# Patient Record
Sex: Male | Born: 1955 | Race: White | Hispanic: No | Marital: Married | Smoking: Former smoker
Health system: Southern US, Community
[De-identification: ages and names within clinical notes are randomized; demographics above are authoritative.]

## PROBLEM LIST (undated history)

## (undated) DIAGNOSIS — I10 Essential (primary) hypertension: Secondary | ICD-10-CM

## (undated) DIAGNOSIS — R7301 Impaired fasting glucose: Secondary | ICD-10-CM

## (undated) DIAGNOSIS — E785 Hyperlipidemia, unspecified: Secondary | ICD-10-CM

## (undated) DIAGNOSIS — I2510 Atherosclerotic heart disease of native coronary artery without angina pectoris: Secondary | ICD-10-CM

## (undated) DIAGNOSIS — R5383 Other fatigue: Secondary | ICD-10-CM

## (undated) DIAGNOSIS — R002 Palpitations: Secondary | ICD-10-CM

## (undated) HISTORY — DX: Essential (primary) hypertension: I10

## (undated) HISTORY — DX: Palpitations: R00.2

## (undated) HISTORY — PX: OTHER SURGICAL HISTORY: SHX169

## (undated) HISTORY — DX: Atherosclerotic heart disease of native coronary artery without angina pectoris: I25.10

## (undated) HISTORY — DX: Impaired fasting glucose: R73.01

## (undated) HISTORY — DX: Other fatigue: R53.83

## (undated) HISTORY — DX: Hyperlipidemia, unspecified: E78.5

---

## 1999-01-07 ENCOUNTER — Ambulatory Visit (HOSPITAL_COMMUNITY): Admission: RE | Admit: 1999-01-07 | Discharge: 1999-01-07 | Payer: Self-pay | Admitting: *Deleted

## 2001-07-31 ENCOUNTER — Encounter: Payer: Self-pay | Admitting: Cardiology

## 2001-07-31 ENCOUNTER — Ambulatory Visit (HOSPITAL_COMMUNITY): Admission: RE | Admit: 2001-07-31 | Discharge: 2001-07-31 | Payer: Self-pay | Admitting: Cardiology

## 2001-12-22 ENCOUNTER — Ambulatory Visit (HOSPITAL_COMMUNITY): Admission: RE | Admit: 2001-12-22 | Discharge: 2001-12-22 | Payer: Self-pay | Admitting: Pulmonary Disease

## 2010-01-14 ENCOUNTER — Emergency Department (HOSPITAL_COMMUNITY)
Admission: EM | Admit: 2010-01-14 | Discharge: 2010-01-14 | Payer: Self-pay | Source: Home / Self Care | Admitting: Emergency Medicine

## 2010-06-25 LAB — URINALYSIS, ROUTINE W REFLEX MICROSCOPIC
Bilirubin Urine: NEGATIVE
Glucose, UA: 250 mg/dL — AB
Ketones, ur: NEGATIVE mg/dL
Leukocytes, UA: NEGATIVE
Nitrite: NEGATIVE
Protein, ur: 30 mg/dL — AB
Specific Gravity, Urine: >1.03 — ABNORMAL HIGH (ref 1.005–1.030)
Urobilinogen, UA: 0.2 mg/dL (ref 0.0–1.0)
pH: 5.5 (ref 5.0–8.0)

## 2010-06-25 LAB — HEPATIC FUNCTION PANEL
Albumin: 4.6 g/dL (ref 3.5–5.2)
Alkaline Phosphatase: 42 U/L (ref 39–117)
Total Protein: 7.4 g/dL (ref 6.0–8.3)

## 2010-06-25 LAB — LIPASE, BLOOD: Lipase: 34 U/L (ref 11–59)

## 2010-06-25 LAB — CBC
MCH: 29.9 pg (ref 26.0–34.0)
Platelets: 132 10*3/uL — ABNORMAL LOW (ref 150–400)
RBC: 5.15 MIL/uL (ref 4.22–5.81)

## 2010-06-25 LAB — DIFFERENTIAL
Basophils Absolute: 0 10*3/uL (ref 0.0–0.1)
Basophils Relative: 0 % (ref 0–1)
Eosinophils Absolute: 0 10*3/uL (ref 0.0–0.7)
Lymphs Abs: 1.4 10*3/uL (ref 0.7–4.0)
Neutrophils Relative %: 78 % — ABNORMAL HIGH (ref 43–77)

## 2010-06-25 LAB — URINE MICROSCOPIC-ADD ON

## 2010-06-25 LAB — BASIC METABOLIC PANEL
CO2: 25 mEq/L (ref 19–32)
Calcium: 9.7 mg/dL (ref 8.4–10.5)
Creatinine, Ser: 1.07 mg/dL (ref 0.4–1.5)
GFR calc Af Amer: >60 mL/min (ref >60)

## 2010-08-28 NOTE — Procedures (Signed)
[facility]  Patient:    Glenn [name], Glenn [name] Visit Number: [id number] MRN: [id number]          Service Type: OUT Location: RAD Attending Physician:  Mirian Mo Dictated by:   Joellyn Rued, P.A.-C. Proc. Date: 07/31/01 Admit Date:  07/31/2001   CC:         Butch Penny, M.D.   Stress Test  DATE OF BIRTH:  May 13, 1955  PROCEDURE:  Stress Cardiolite test  CARDIOLOGIST:  Valera Castle, M.D.  PRIMARY CARE PHYSICIAN:  Butch Penny, M.D.  INDICATIONS:  [name] is a 55 year old white male who was seen back in the office on his yearly evaluation for risk factor modification.  He is not having any active cardiac problems.  His history is notable for hyperlipidemia, nonobstructive coronary artery disease by catheterization in 2000, where he had a 70% diagonal-I and a 20%-30% proximal RCA.  He has remote tobacco use.  A strong early family history.  DESCRIPTION OF PROCEDURE:  The resting electrocardiogram showed a normal sinus rhythm.  Blood pressure 114/80.  Utilizing the Bruce protocol, [name] ambulated for a total of 13 minutes and 37 seconds, achieving 15.1 METS. Maximum heart rate was 174, which was 101% of predicted maximum value.  The maximum blood pressure was 200/60.  He did not have any electrocardiogram changes during the test.  His symptoms were fatigue.  The final results and images are pending Dr. Lucianne Muss review. Dictated by:   Joellyn Rued, P.A.-C. Attending Physician:  Mirian Mo DD:  07/31/01 TD:  07/31/01 Job: [id number]

## 2013-07-11 ENCOUNTER — Ambulatory Visit (HOSPITAL_COMMUNITY)
Admission: RE | Admit: 2013-07-11 | Discharge: 2013-07-11 | Disposition: A | Discharge status: Home / Self Care | Payer: 59 | Source: Ambulatory Visit | Attending: Family Medicine | Admitting: Family Medicine

## 2013-07-11 ENCOUNTER — Other Ambulatory Visit (HOSPITAL_COMMUNITY): Payer: Self-pay | Admitting: Family Medicine

## 2013-07-11 DIAGNOSIS — M25569 Pain in unspecified knee: Secondary | ICD-10-CM

## 2013-07-11 DIAGNOSIS — M259 Joint disorder, unspecified: Secondary | ICD-10-CM | POA: Insufficient documentation

## 2013-07-11 DIAGNOSIS — M25469 Effusion, unspecified knee: Secondary | ICD-10-CM | POA: Insufficient documentation

## 2015-12-23 ENCOUNTER — Encounter: Payer: Self-pay | Admitting: Orthopaedic Surgery

## 2015-12-23 ENCOUNTER — Ambulatory Visit (INDEPENDENT_AMBULATORY_CARE_PROVIDER_SITE_OTHER): Payer: 59 | Admitting: Orthopaedic Surgery

## 2015-12-23 ENCOUNTER — Ambulatory Visit (INDEPENDENT_AMBULATORY_CARE_PROVIDER_SITE_OTHER): Payer: 59

## 2015-12-23 VITALS — BP 143/71 | HR 58 | Ht 70.0 in | Wt 188.0 lb

## 2015-12-23 DIAGNOSIS — M25561 Pain in right knee: Secondary | ICD-10-CM

## 2015-12-23 NOTE — Progress Notes (Signed)
Subjective:    Patient ID: Glenn [name], male    DOB: [birth year], 60 y.o.   MRN: [id number]  HPI  He hit his right knee on truck door about two to three weeks ago.  He had pain and then swelling of the knee around the patella tendon area.  It became very swollen and painful.  He has had no redness, no giving way.  He saw his family doctor and some blood was aspirated.  He still has swelling and pain.  He is not getting any better. Ice and heat have not helped.  He has no other trauma, no other joint pains.  Review of Systems  HENT: Negative for congestion.   Respiratory: Negative for cough and shortness of breath.   Cardiovascular: Negative for chest pain and leg swelling.  Endocrine: Negative for cold intolerance.  Musculoskeletal: Positive for arthralgias, gait problem and joint swelling.  Allergic/Immunologic: Negative for environmental allergies.   History reviewed. No pertinent past medical history.  History reviewed. No pertinent surgical history.  No current outpatient prescriptions on file prior to visit.   No current facility-administered medications on file prior to visit.     Social History   Social History  . Marital status: Married    Spouse name: N/A  . Number of children: N/A  . Years of education: N/A   Occupational History  . Not on file.   Social History Main Topics  . Smoking status: Former Games developer  . Smokeless tobacco: Never Used  . Alcohol use Yes     Comment: on occasion  . Drug use: No  . Sexual activity: Not on file   Other Topics Concern  . Not on file   Social History Narrative  . No narrative on file    Family History  Problem Relation Age of Onset  . Diabetes Mother   . Hypertension Mother   . Cancer Father     BP (!) 143/71   Pulse (!) 58   Ht 5\' 10"  (1.778 m)   Wt 188 lb (85.3 kg)   BMI 26.98 kg/m       Objective:   Physical Exam  Constitutional: He is oriented to person, place, and time. He appears well-developed  and well-nourished.  HENT:  Head: Normocephalic and atraumatic.  Eyes: Conjunctivae and EOM are normal. Pupils are equal, round, and reactive to light.  Neck: Normal range of motion. Neck supple.  Cardiovascular: Normal rate, regular rhythm and intact distal pulses.   Pulmonary/Chest: Effort normal.  Abdominal: Soft.  Musculoskeletal: He exhibits tenderness (He has very large anterior effusion of the knee anterior to the patella tendon and not over the patella.  He has no effusion of the knee itself.  ROM is tender but nearly full.  It is stable.  Left knee is negative.).  Neurological: He is alert and oriented to person, place, and time. He displays normal reflexes. No cranial nerve deficit. He exhibits normal muscle tone. Coordination normal.  Skin: Skin is warm and dry.  Psychiatric: He has a normal mood and affect. His behavior is normal. Judgment and thought content normal.    X-rays were done of the right knee, reported separately.      Assessment & Plan:   Encounter Diagnosis  Name Primary?  . Right knee pain Yes   PROCEDURE NOTE:  The patient request injection, verbal consent was obtained.  The right knee was prepped appropriately after time out was performed.   Sterile technique was observed  and anesthesia was provided by ethyl chloride and a 20-gauge needle was used to inject the knee area.  A 16-gauge needle was then used to aspirate the knee.  Color of fluid aspirated was bloody  Total cc's aspirated was 50.    A band aid dressing was applied.  The patient was advised to apply ice later today and tomorrow to the injection sight as needed.  I have told him to use ice to the right knee.  Return in one week.  Call if any problem.  Precautions discussed.  Electronically Signed Darreld McleanWayne Alizey Noren, MD 9/12/20179:44 AM

## 2015-12-30 ENCOUNTER — Ambulatory Visit (INDEPENDENT_AMBULATORY_CARE_PROVIDER_SITE_OTHER): Payer: 59 | Admitting: Orthopaedic Surgery

## 2015-12-30 ENCOUNTER — Encounter: Payer: Self-pay | Admitting: Orthopaedic Surgery

## 2015-12-30 VITALS — BP 159/83 | HR 57 | Temp 98.1°F | Ht 70.0 in | Wt 188.0 lb

## 2015-12-30 DIAGNOSIS — M25561 Pain in right knee: Secondary | ICD-10-CM | POA: Diagnosis not present

## 2015-12-30 MED ORDER — PREDNISONE 5 MG (21) PO TBPK: ORAL_TABLET | ORAL | 0 refills | Status: DC

## 2015-12-30 NOTE — Progress Notes (Signed)
CC:  My knee is swelling again  He has recurrent swelling of the pre-patella tendon area.  It started swelling the next day after he was here last week.  He has a large effusion over the patella tendon area.  Procedure note:  After sterile prep and permission from the patient, the area of swelling in the right pre-patella tendon area was aspirated by sterile technique tolerated well.  30 cc of bloody material was aspirated.  He is to return in one week.  If the swelling returns, consider surgery.  Call if any problem.  Encounter Diagnosis  Name Primary?  . Right knee pain Yes   Electronically Signed Darreld Mclean, MD 9/19/20179:55 AM

## 2016-01-06 ENCOUNTER — Encounter: Payer: Self-pay | Admitting: Orthopaedic Surgery

## 2016-01-06 ENCOUNTER — Ambulatory Visit (INDEPENDENT_AMBULATORY_CARE_PROVIDER_SITE_OTHER): Payer: 59 | Admitting: Orthopaedic Surgery

## 2016-01-06 VITALS — BP 161/82 | HR 65 | Temp 97.9°F | Ht 70.0 in | Wt 190.0 lb

## 2016-01-06 DIAGNOSIS — M25561 Pain in right knee: Secondary | ICD-10-CM

## 2016-01-06 NOTE — Progress Notes (Signed)
[name], male DOB:1955-10-07, 60 y.o. ZJQ:[id number]  Chief Complaint  Patient presents with  . Follow-up    right knee    HPI  Glenn [name] is a 60 y.o. male who has recurrent effusion of the pre-patella tendon area on the right.  He has less swelling today but it has recurred after aspiration.  I have suggested he consider excision of the bursa.  He wants to think about that.  I will see him in one month.  He is to call if worse or turns red. HPI  Body mass index is 27.26 kg/m.  ROS  Review of Systems  HENT: Negative for congestion.   Respiratory: Negative for cough and shortness of breath.   Cardiovascular: Negative for chest pain and leg swelling.  Endocrine: Negative for cold intolerance.  Musculoskeletal: Positive for arthralgias, gait problem and joint swelling.  Allergic/Immunologic: Negative for environmental allergies.    No past medical history on file.  No past surgical history on file.  Family History  Problem Relation Age of Onset  . Diabetes Mother   . Hypertension Mother   . Cancer Father     Social History Social History  Substance Use Topics  . Smoking status: Former Games developer  . Smokeless tobacco: Never Used  . Alcohol use Yes     Comment: on occasion    No Known Allergies  Current Outpatient Prescriptions  Medication Sig Dispense Refill  . aspirin EC 81 MG tablet Take 81 mg by mouth daily.    . naproxen (NAPROSYN) 500 MG tablet Take 500 mg by mouth 2 (two) times daily with a meal.    . predniSONE (STERAPRED UNI-PAK 21 TAB) 5 MG (21) TBPK tablet Take 6 pills first day; 5 pills second day; 4 pills third day; 3 pills fourth day; 2 pills next day and 1 pill last day. (Patient not taking: Reported on 01/06/2016) 21 tablet 0   No current facility-administered medications for this visit.      Physical Exam  Blood pressure (!) 161/82, pulse 65, temperature 97.9 F (36.6 C), height 5\' 10"  (1.778 m), weight 190 lb (86.2  kg).  Constitutional: overall normal hygiene, normal nutrition, well developed, normal grooming, normal body habitus. Assistive device:none  Musculoskeletal: gait and station Limp none, muscle tone and strength are normal, no tremors or atrophy is present.  .  Neurological: coordination overall normal.  Deep tendon reflex/nerve stretch intact.  Sensation normal.  Cranial nerves II-XII intact.   Skin:   Normal overall no scars, lesions, ulcers or rashes. No psoriasis.  Psychiatric: Alert and oriented x 3.  Recent memory intact, remote memory unclear.  Normal mood and affect. Well groomed.  Good eye contact.  Cardiovascular: overall no swelling, no varicosities, no edema bilaterally, normal temperatures of the legs and arms, no clubbing, cyanosis and good capillary refill.  Lymphatic: palpation is normal.  He has a large pre-patella tendon bursa that is about size of half an orange.  It is not red and not tender.  He has no effusion of the knee.  He has full motion of the knee on the right.  He has no limp.  NV intact.  The patient has been educated about the nature of the problem(s) and counseled on treatment options.  The patient appeared to understand what I have discussed and is in agreement with it.  Encounter Diagnosis  Name Primary?  . Right knee pain Yes    PLAN Call if any problems.  Precautions  discussed.  Continue current medications.   Return to clinic 1 month   Electronically Signed Darreld McleanWayne Emilie Carp, MD 9/26/20178:21 AM

## 2016-01-12 ENCOUNTER — Telehealth: Payer: Self-pay | Admitting: Orthopaedic Surgery

## 2016-01-12 NOTE — Telephone Encounter (Signed)
Patient has decided not to wait to see you later this month for right knee pain.  He wants to go ahead and see Dr. Romeo Apple for possible excision of bursa that you discussed with him.    Can we get this scheduled with Dr. Romeo Apple?

## 2016-01-18 NOTE — Telephone Encounter (Signed)
Of course!

## 2016-02-03 ENCOUNTER — Encounter: Payer: Self-pay | Admitting: Orthopaedic Surgery

## 2016-02-03 ENCOUNTER — Ambulatory Visit (INDEPENDENT_AMBULATORY_CARE_PROVIDER_SITE_OTHER): Payer: 59 | Admitting: Orthopaedic Surgery

## 2016-02-03 VITALS — BP 166/86 | HR 56 | Temp 97.7°F | Ht 70.0 in | Wt 189.0 lb

## 2016-02-03 DIAGNOSIS — M25561 Pain in right knee: Secondary | ICD-10-CM

## 2016-02-03 DIAGNOSIS — G8929 Other chronic pain: Secondary | ICD-10-CM

## 2016-02-03 NOTE — Progress Notes (Signed)
[name], male DOB:[birth year], 60 y.o. OIN:[id number]  Chief Complaint  Patient presents with  . Follow-up    right knee pain    HPI  Glenn [name] is a 60 y.o. male who has prepatella tendon swelling.  It is much improved. He has less pain and full motion.  He has no new trauma,no redness. HPI  Body mass index is 27.12 kg/m.  ROS  Review of Systems  HENT: Negative for congestion.   Respiratory: Negative for cough and shortness of breath.   Cardiovascular: Negative for chest pain and leg swelling.  Endocrine: Negative for cold intolerance.  Musculoskeletal: Positive for arthralgias, gait problem and joint swelling.  Allergic/Immunologic: Negative for environmental allergies.    No past medical history on file.  No past surgical history on file.  Family History  Problem Relation Age of Onset  . Diabetes Mother   . Hypertension Mother   . Cancer Father     Social History Social History  Substance Use Topics  . Smoking status: Former Games developer  . Smokeless tobacco: Never Used  . Alcohol use Yes     Comment: on occasion    No Known Allergies  Current Outpatient Prescriptions  Medication Sig Dispense Refill  . aspirin EC 81 MG tablet Take 81 mg by mouth daily.    . naproxen (NAPROSYN) 500 MG tablet Take 500 mg by mouth 2 (two) times daily with a meal.    . predniSONE (STERAPRED UNI-PAK 21 TAB) 5 MG (21) TBPK tablet Take 6 pills first day; 5 pills second day; 4 pills third day; 3 pills fourth day; 2 pills next day and 1 pill last day. (Patient not taking: Reported on 01/06/2016) 21 tablet 0   No current facility-administered medications for this visit.      Physical Exam  Blood pressure (!) 166/86, pulse (!) 56, temperature 97.7 F (36.5 C), height 5\' 10"  (1.778 m), weight 189 lb (85.7 kg).  Constitutional: overall normal hygiene, normal nutrition, well developed, normal grooming, normal body habitus. Assistive device:none  Musculoskeletal: gait  and station Limp none, muscle tone and strength are normal, no tremors or atrophy is present.  .  Neurological: coordination overall normal.  Deep tendon reflex/nerve stretch intact.  Sensation normal.  Cranial nerves II-XII intact.   Skin:   Normal overall no scars, lesions, ulcers or rashes. No psoriasis.  Psychiatric: Alert and oriented x 3.  Recent memory intact, remote memory unclear.  Normal mood and affect. Well groomed.  Good eye contact.  Cardiovascular: overall no swelling, no varicosities, no edema bilaterally, normal temperatures of the legs and arms, no clubbing, cyanosis and good capillary refill.  Lymphatic: palpation is normal.  His pre-patella tendon swelling on the right knee is almost gone.  He has residual of the bursa itself.  He has no redness, full motion, normal gait.  The patient has been educated about the nature of the problem(s) and counseled on treatment options.  The patient appeared to understand what I have discussed and is in agreement with it.  Encounter Diagnosis  Name Primary?  . Chronic pain of right knee Yes    PLAN Call if any problems.  Precautions discussed.  Continue current medications.   Return to clinic PRn   Electronically Signed Darreld Mclean, MD 10/24/20178:32 AM

## 2017-02-26 DIAGNOSIS — M25512 Pain in left shoulder: Secondary | ICD-10-CM | POA: Diagnosis not present

## 2017-02-28 DIAGNOSIS — M546 Pain in thoracic spine: Secondary | ICD-10-CM | POA: Diagnosis not present

## 2017-02-28 DIAGNOSIS — S43422A Sprain of left rotator cuff capsule, initial encounter: Secondary | ICD-10-CM | POA: Diagnosis not present

## 2017-02-28 DIAGNOSIS — S134XXA Sprain of ligaments of cervical spine, initial encounter: Secondary | ICD-10-CM | POA: Diagnosis not present

## 2017-03-01 DIAGNOSIS — S43422A Sprain of left rotator cuff capsule, initial encounter: Secondary | ICD-10-CM | POA: Diagnosis not present

## 2017-03-01 DIAGNOSIS — S134XXA Sprain of ligaments of cervical spine, initial encounter: Secondary | ICD-10-CM | POA: Diagnosis not present

## 2017-03-01 DIAGNOSIS — M546 Pain in thoracic spine: Secondary | ICD-10-CM | POA: Diagnosis not present

## 2017-03-07 DIAGNOSIS — S134XXA Sprain of ligaments of cervical spine, initial encounter: Secondary | ICD-10-CM | POA: Diagnosis not present

## 2017-03-07 DIAGNOSIS — M546 Pain in thoracic spine: Secondary | ICD-10-CM | POA: Diagnosis not present

## 2017-03-07 DIAGNOSIS — S43422A Sprain of left rotator cuff capsule, initial encounter: Secondary | ICD-10-CM | POA: Diagnosis not present

## 2017-03-10 DIAGNOSIS — S134XXA Sprain of ligaments of cervical spine, initial encounter: Secondary | ICD-10-CM | POA: Diagnosis not present

## 2017-03-10 DIAGNOSIS — S43422A Sprain of left rotator cuff capsule, initial encounter: Secondary | ICD-10-CM | POA: Diagnosis not present

## 2017-03-10 DIAGNOSIS — M546 Pain in thoracic spine: Secondary | ICD-10-CM | POA: Diagnosis not present

## 2017-03-14 DIAGNOSIS — M546 Pain in thoracic spine: Secondary | ICD-10-CM | POA: Diagnosis not present

## 2017-03-14 DIAGNOSIS — S43422A Sprain of left rotator cuff capsule, initial encounter: Secondary | ICD-10-CM | POA: Diagnosis not present

## 2017-03-14 DIAGNOSIS — S134XXA Sprain of ligaments of cervical spine, initial encounter: Secondary | ICD-10-CM | POA: Diagnosis not present

## 2017-03-22 DIAGNOSIS — S134XXA Sprain of ligaments of cervical spine, initial encounter: Secondary | ICD-10-CM | POA: Diagnosis not present

## 2017-03-22 DIAGNOSIS — M546 Pain in thoracic spine: Secondary | ICD-10-CM | POA: Diagnosis not present

## 2017-03-22 DIAGNOSIS — S43422A Sprain of left rotator cuff capsule, initial encounter: Secondary | ICD-10-CM | POA: Diagnosis not present

## 2017-03-25 DIAGNOSIS — S43422A Sprain of left rotator cuff capsule, initial encounter: Secondary | ICD-10-CM | POA: Diagnosis not present

## 2017-03-25 DIAGNOSIS — S134XXA Sprain of ligaments of cervical spine, initial encounter: Secondary | ICD-10-CM | POA: Diagnosis not present

## 2017-03-25 DIAGNOSIS — M546 Pain in thoracic spine: Secondary | ICD-10-CM | POA: Diagnosis not present

## 2017-03-28 DIAGNOSIS — S43422A Sprain of left rotator cuff capsule, initial encounter: Secondary | ICD-10-CM | POA: Diagnosis not present

## 2017-03-28 DIAGNOSIS — S134XXA Sprain of ligaments of cervical spine, initial encounter: Secondary | ICD-10-CM | POA: Diagnosis not present

## 2017-03-28 DIAGNOSIS — M546 Pain in thoracic spine: Secondary | ICD-10-CM | POA: Diagnosis not present

## 2017-04-07 DIAGNOSIS — M546 Pain in thoracic spine: Secondary | ICD-10-CM | POA: Diagnosis not present

## 2017-04-07 DIAGNOSIS — S43422A Sprain of left rotator cuff capsule, initial encounter: Secondary | ICD-10-CM | POA: Diagnosis not present

## 2017-04-07 DIAGNOSIS — S134XXA Sprain of ligaments of cervical spine, initial encounter: Secondary | ICD-10-CM | POA: Diagnosis not present

## 2017-04-13 DIAGNOSIS — S134XXA Sprain of ligaments of cervical spine, initial encounter: Secondary | ICD-10-CM | POA: Diagnosis not present

## 2017-04-13 DIAGNOSIS — S43422A Sprain of left rotator cuff capsule, initial encounter: Secondary | ICD-10-CM | POA: Diagnosis not present

## 2017-04-13 DIAGNOSIS — M546 Pain in thoracic spine: Secondary | ICD-10-CM | POA: Diagnosis not present

## 2017-04-19 DIAGNOSIS — S134XXA Sprain of ligaments of cervical spine, initial encounter: Secondary | ICD-10-CM | POA: Diagnosis not present

## 2017-04-19 DIAGNOSIS — M546 Pain in thoracic spine: Secondary | ICD-10-CM | POA: Diagnosis not present

## 2017-04-19 DIAGNOSIS — S43422A Sprain of left rotator cuff capsule, initial encounter: Secondary | ICD-10-CM | POA: Diagnosis not present

## 2017-05-03 DIAGNOSIS — S43422A Sprain of left rotator cuff capsule, initial encounter: Secondary | ICD-10-CM | POA: Diagnosis not present

## 2017-05-03 DIAGNOSIS — M546 Pain in thoracic spine: Secondary | ICD-10-CM | POA: Diagnosis not present

## 2017-05-03 DIAGNOSIS — S134XXA Sprain of ligaments of cervical spine, initial encounter: Secondary | ICD-10-CM | POA: Diagnosis not present

## 2017-05-09 DIAGNOSIS — M75122 Complete rotator cuff tear or rupture of left shoulder, not specified as traumatic: Secondary | ICD-10-CM | POA: Insufficient documentation

## 2017-05-09 DIAGNOSIS — Y990 Civilian activity done for income or pay: Secondary | ICD-10-CM | POA: Insufficient documentation

## 2017-07-01 HISTORY — PX: SHOULDER ACROMIOPLASTY: SHX6093

## 2017-07-11 DIAGNOSIS — Z9889 Other specified postprocedural states: Secondary | ICD-10-CM | POA: Insufficient documentation

## 2017-11-17 ENCOUNTER — Encounter: Payer: Self-pay | Admitting: Cardiovascular Disease

## 2017-11-17 DIAGNOSIS — I10 Essential (primary) hypertension: Secondary | ICD-10-CM | POA: Diagnosis not present

## 2017-11-17 DIAGNOSIS — Z6827 Body mass index (BMI) 27.0-27.9, adult: Secondary | ICD-10-CM | POA: Diagnosis not present

## 2017-11-17 DIAGNOSIS — R002 Palpitations: Secondary | ICD-10-CM | POA: Diagnosis not present

## 2017-11-17 DIAGNOSIS — R5383 Other fatigue: Secondary | ICD-10-CM | POA: Diagnosis not present

## 2017-11-18 ENCOUNTER — Encounter: Payer: Self-pay | Admitting: Cardiovascular Disease

## 2017-11-18 DIAGNOSIS — I10 Essential (primary) hypertension: Secondary | ICD-10-CM | POA: Diagnosis not present

## 2017-11-18 DIAGNOSIS — E039 Hypothyroidism, unspecified: Secondary | ICD-10-CM | POA: Diagnosis not present

## 2017-11-18 DIAGNOSIS — R5383 Other fatigue: Secondary | ICD-10-CM | POA: Diagnosis not present

## 2017-11-18 DIAGNOSIS — Y92538 Other ambulatory health services establishments as the place of occurrence of the external cause: Secondary | ICD-10-CM | POA: Diagnosis not present

## 2017-11-18 DIAGNOSIS — R7301 Impaired fasting glucose: Secondary | ICD-10-CM | POA: Diagnosis not present

## 2017-11-18 DIAGNOSIS — R002 Palpitations: Secondary | ICD-10-CM | POA: Diagnosis not present

## 2017-11-18 DIAGNOSIS — D519 Vitamin B12 deficiency anemia, unspecified: Secondary | ICD-10-CM | POA: Diagnosis not present

## 2017-11-21 DIAGNOSIS — I10 Essential (primary) hypertension: Secondary | ICD-10-CM | POA: Diagnosis not present

## 2017-11-21 DIAGNOSIS — R5383 Other fatigue: Secondary | ICD-10-CM | POA: Diagnosis not present

## 2017-11-21 DIAGNOSIS — E785 Hyperlipidemia, unspecified: Secondary | ICD-10-CM | POA: Diagnosis not present

## 2017-11-21 DIAGNOSIS — Z0001 Encounter for general adult medical examination with abnormal findings: Secondary | ICD-10-CM | POA: Diagnosis not present

## 2017-12-19 ENCOUNTER — Encounter: Payer: Self-pay | Admitting: *Deleted

## 2017-12-20 ENCOUNTER — Ambulatory Visit (INDEPENDENT_AMBULATORY_CARE_PROVIDER_SITE_OTHER): Payer: 59 | Admitting: Cardiovascular Disease

## 2017-12-20 ENCOUNTER — Telehealth: Payer: Self-pay | Admitting: Cardiovascular Disease

## 2017-12-20 ENCOUNTER — Encounter: Payer: Self-pay | Admitting: Cardiovascular Disease

## 2017-12-20 ENCOUNTER — Encounter: Payer: Self-pay | Admitting: *Deleted

## 2017-12-20 VITALS — BP 90/58 | HR 56 | Ht 69.0 in | Wt 174.0 lb

## 2017-12-20 DIAGNOSIS — R0600 Dyspnea, unspecified: Secondary | ICD-10-CM

## 2017-12-20 DIAGNOSIS — R5383 Other fatigue: Secondary | ICD-10-CM | POA: Diagnosis not present

## 2017-12-20 DIAGNOSIS — E785 Hyperlipidemia, unspecified: Secondary | ICD-10-CM

## 2017-12-20 DIAGNOSIS — R0609 Other forms of dyspnea: Secondary | ICD-10-CM | POA: Diagnosis not present

## 2017-12-20 DIAGNOSIS — R079 Chest pain, unspecified: Secondary | ICD-10-CM

## 2017-12-20 DIAGNOSIS — I10 Essential (primary) hypertension: Secondary | ICD-10-CM | POA: Diagnosis not present

## 2017-12-20 NOTE — Progress Notes (Signed)
CARDIOLOGY CONSULT NOTE  Patient ID: JACQUES POPKE MRN: [id number] DOB/AGE: [birth year] 63 y.o.  Admit date: (Not on file) Primary Physician: Benita Stabile, MD Referring Physician: Benita Stabile, MD  Reason for Consultation: Fatigue  HPI: Glenn [name] is a 62 y.o. male who is being seen today for the evaluation of fatigue at the request of Benita Stabile, MD.   Past medical history includes hypertension and hyperlipidemia.  I personally reviewed the ECG performed on 11/17/2017 performed at his PCPs office which demonstrates sinus rhythm with T wave abnormalities in inferolateral leads.  I reviewed labs performed on 11/18/2017: Hemoglobin 16.2, platelets 158, BUN 19, creatinine 1.15, sodium 142, potassium 4.6, total cholesterol 243, triglycerides 114, HDL 53, LDL 167, hemoglobin A1c 5.8%, TSH 1.28.  He underwent a normal nuclear stress test on 08/09/2001.  He underwent left rotator cuff repair surgery last year and has not felt well since then.  He has felt fatigued but has noticed progressive exertional dyspnea and occasional precordial chest pains.  This week he tried to walk his trash can to the end of the driveway and felt short of breath.  He denies leg swelling but has noticed some bilateral calf soreness.  He has had palpitations.  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  Family history: Mother has diabetes and coronary disease.  His youngest brother also has coronary disease.    No Known Allergies  Current Outpatient Medications  Medication Sig Dispense Refill  . aspirin EC 81 MG tablet Take 81 mg by mouth daily.    Marland Kitchen escitalopram (LEXAPRO) 10 MG tablet Take 10 mg by mouth daily.    Marland Kitchen losartan (COZAAR) 25 MG tablet Take 25 mg by mouth daily.    . ranitidine (ZANTAC) 75 MG tablet Take 75 mg by mouth daily.     No current facility-administered medications for this visit.     Past Medical History:  Diagnosis Date  . Essential hypertension   .  Hyperlipidemia   . Impaired fasting glucose   . Other fatigue   . Palpitations     Past Surgical History:  Procedure Laterality Date  . SHOULDER ACROMIOPLASTY  07/01/2017    Social History   Socioeconomic History  . Marital status: Married    Spouse name: Not on file  . Number of children: Not on file  . Years of education: Not on file  . Highest education level: Not on file  Occupational History  . Not on file  Social Needs  . Financial resource strain: Not on file  . Food insecurity:    Worry: Not on file    Inability: Not on file  . Transportation needs:    Medical: Not on file    Non-medical: Not on file  Tobacco Use  . Smoking status: Former Games developer  . Smokeless tobacco: Never Used  Substance and Sexual Activity  . Alcohol use: Yes    Comment: on occasion  . Drug use: No  . Sexual activity: Not on file  Lifestyle  . Physical activity:    Days per week: Not on file    Minutes per session: Not on file  . Stress: Not on file  Relationships  . Social connections:    Talks on phone: Not on file    Gets together: Not on file    Attends religious service: Not on file    Active member of club or organization: Not  on file    Attends meetings of clubs or organizations: Not on file    Relationship status: Not on file  . Intimate partner violence:    Fear of current or ex partner: Not on file    Emotionally abused: Not on file    Physically abused: Not on file    Forced sexual activity: Not on file  Other Topics Concern  . Not on file  Social History Narrative  . Not on file      Current Meds  Medication Sig  . aspirin EC 81 MG tablet Take 81 mg by mouth daily.  Marland Kitchen escitalopram (LEXAPRO) 10 MG tablet Take 10 mg by mouth daily.  Marland Kitchen losartan (COZAAR) 25 MG tablet Take 25 mg by mouth daily.  . ranitidine (ZANTAC) 75 MG tablet Take 75 mg by mouth daily.      Review of systems complete and found to be negative unless listed above in HPI    Physical  exam Blood pressure (!) 90/58, pulse (!) 56, height 5\' 9"  (1.753 m), weight 174 lb (78.9 kg), SpO2 98 %. General: NAD Neck: No JVD, no thyromegaly or thyroid nodule.  Lungs: Clear to auscultation bilaterally with normal respiratory effort. CV: Nondisplaced PMI. Regular rate and rhythm, normal S1/S2, no S3/S4, no murmur.  No peripheral edema.  No carotid bruit.    Abdomen: Soft, nontender, no distention.  Skin: Intact without lesions or rashes.  Neurologic: Alert and oriented x 3.  Psych: Normal affect. Extremities: No clubbing or cyanosis.  HEENT: Normal.   ECG: Most recent ECG reviewed.   Labs: Lab Results  Component Value Date/Time   K 4.3 01/14/2010 10:40 AM   BUN 18 01/14/2010 10:40 AM   CREATININE 1.07 01/14/2010 10:40 AM   ALT 24 01/14/2010 10:40 AM   HGB 15.4 01/14/2010 10:40 AM     Lipids: No results found for: LDLCALC, LDLDIRECT, CHOL, TRIG, HDL      ASSESSMENT AND PLAN:  1.  Fatigue, chest pain, and progressive exertional dyspnea with abnormal ECG: Symptoms are certainly suspicious for ischemic heart disease.  I will obtain an exercise Myoview stress test for further clarification.  2.  Hypertension: Blood pressure is low.  He is on losartan 25 mg daily.  The dose of this may need to be decreased.  His blood pressure had been high previously.  He has been on losartan for at least a month.  This will need continued monitoring.  3.  Hyperlipidemia: Lipids reviewed above.  Managed by PCP.     Disposition: Follow up in 6-8 weeks  Signed: Prentice Docker, M.D., F.A.C.C.  12/20/2017, 1:26 PM

## 2017-12-20 NOTE — Telephone Encounter (Signed)
Pre-cert Verification for the following procedure   Exercise myoview scheduled for 12-22-2017 at [facility]

## 2017-12-20 NOTE — Patient Instructions (Addendum)
Medication Instructions:  Continue all current medications.  Labwork: none  Testing/Procedures:  Your physician has requested that you have an exercise stress myoview. For further information please visit [email or web address] Please follow instruction sheet, as given.  Office will contact with results via phone or letter.    Follow-Up: Next available   Any Other Special Instructions Will Be Listed Below (If Applicable).  If you need a refill on your cardiac medications before your next appointment, please call your pharmacy.

## 2017-12-21 NOTE — Addendum Note (Signed)
Addended by: Eustace Moore on: 12/21/2017 03:54 PM   Modules accepted: Orders

## 2017-12-22 ENCOUNTER — Other Ambulatory Visit: Payer: Self-pay

## 2017-12-22 ENCOUNTER — Encounter (HOSPITAL_COMMUNITY): Payer: Self-pay | Admitting: Emergency Medicine

## 2017-12-22 ENCOUNTER — Inpatient Hospital Stay (HOSPITAL_COMMUNITY): Payer: 59

## 2017-12-22 ENCOUNTER — Inpatient Hospital Stay (HOSPITAL_BASED_OUTPATIENT_CLINIC_OR_DEPARTMENT_OTHER)
Admission: RE | Admit: 2017-12-22 | Discharge: 2017-12-22 | Disposition: A | Discharge status: Home / Self Care | Payer: 59 | Source: Ambulatory Visit | Attending: Cardiovascular Disease | Admitting: Cardiovascular Disease

## 2017-12-22 ENCOUNTER — Encounter (HOSPITAL_COMMUNITY)
Admission: EM | Disposition: A | Discharge status: Home / Self Care | Payer: Self-pay | Source: Home / Self Care | Attending: Cardiothoracic Surgery

## 2017-12-22 ENCOUNTER — Encounter (HOSPITAL_COMMUNITY): Payer: Self-pay

## 2017-12-22 ENCOUNTER — Inpatient Hospital Stay (HOSPITAL_COMMUNITY)
Admission: EM | Admit: 2017-12-22 | Discharge: 2017-12-28 | DRG: 233 | Disposition: A | Discharge status: Home / Self Care | Payer: 59 | Attending: Cardiothoracic Surgery | Admitting: Cardiothoracic Surgery

## 2017-12-22 ENCOUNTER — Encounter (HOSPITAL_COMMUNITY)
Admission: RE | Admit: 2017-12-22 | Discharge: 2017-12-22 | Disposition: A | Discharge status: Home / Self Care | Payer: 59 | Source: Ambulatory Visit | Attending: Cardiovascular Disease | Admitting: Cardiovascular Disease

## 2017-12-22 ENCOUNTER — Other Ambulatory Visit: Payer: Self-pay | Admitting: *Deleted

## 2017-12-22 DIAGNOSIS — Z87891 Personal history of nicotine dependence: Secondary | ICD-10-CM | POA: Diagnosis not present

## 2017-12-22 DIAGNOSIS — Z79899 Other long term (current) drug therapy: Secondary | ICD-10-CM

## 2017-12-22 DIAGNOSIS — I5021 Acute systolic (congestive) heart failure: Secondary | ICD-10-CM | POA: Diagnosis not present

## 2017-12-22 DIAGNOSIS — R7301 Impaired fasting glucose: Secondary | ICD-10-CM | POA: Diagnosis present

## 2017-12-22 DIAGNOSIS — Z8249 Family history of ischemic heart disease and other diseases of the circulatory system: Secondary | ICD-10-CM | POA: Diagnosis not present

## 2017-12-22 DIAGNOSIS — I472 Ventricular tachycardia, unspecified: Secondary | ICD-10-CM

## 2017-12-22 DIAGNOSIS — J939 Pneumothorax, unspecified: Secondary | ICD-10-CM | POA: Diagnosis not present

## 2017-12-22 DIAGNOSIS — D62 Acute posthemorrhagic anemia: Secondary | ICD-10-CM | POA: Diagnosis not present

## 2017-12-22 DIAGNOSIS — I110 Hypertensive heart disease with heart failure: Secondary | ICD-10-CM | POA: Diagnosis present

## 2017-12-22 DIAGNOSIS — R0609 Other forms of dyspnea: Secondary | ICD-10-CM | POA: Insufficient documentation

## 2017-12-22 DIAGNOSIS — Z01810 Encounter for preprocedural cardiovascular examination: Secondary | ICD-10-CM

## 2017-12-22 DIAGNOSIS — H9193 Unspecified hearing loss, bilateral: Secondary | ICD-10-CM | POA: Diagnosis present

## 2017-12-22 DIAGNOSIS — R079 Chest pain, unspecified: Secondary | ICD-10-CM

## 2017-12-22 DIAGNOSIS — I469 Cardiac arrest, cause unspecified: Secondary | ICD-10-CM | POA: Diagnosis not present

## 2017-12-22 DIAGNOSIS — I213 ST elevation (STEMI) myocardial infarction of unspecified site: Secondary | ICD-10-CM | POA: Diagnosis not present

## 2017-12-22 DIAGNOSIS — R5383 Other fatigue: Secondary | ICD-10-CM | POA: Insufficient documentation

## 2017-12-22 DIAGNOSIS — Z7982 Long term (current) use of aspirin: Secondary | ICD-10-CM | POA: Diagnosis not present

## 2017-12-22 DIAGNOSIS — I25110 Atherosclerotic heart disease of native coronary artery with unstable angina pectoris: Secondary | ICD-10-CM

## 2017-12-22 DIAGNOSIS — I219 Acute myocardial infarction, unspecified: Secondary | ICD-10-CM

## 2017-12-22 DIAGNOSIS — I2510 Atherosclerotic heart disease of native coronary artery without angina pectoris: Secondary | ICD-10-CM

## 2017-12-22 DIAGNOSIS — I462 Cardiac arrest due to underlying cardiac condition: Secondary | ICD-10-CM | POA: Diagnosis present

## 2017-12-22 DIAGNOSIS — Z09 Encounter for follow-up examination after completed treatment for conditions other than malignant neoplasm: Secondary | ICD-10-CM

## 2017-12-22 DIAGNOSIS — Z951 Presence of aortocoronary bypass graft: Secondary | ICD-10-CM

## 2017-12-22 DIAGNOSIS — E785 Hyperlipidemia, unspecified: Secondary | ICD-10-CM | POA: Diagnosis present

## 2017-12-22 DIAGNOSIS — D696 Thrombocytopenia, unspecified: Secondary | ICD-10-CM | POA: Diagnosis not present

## 2017-12-22 DIAGNOSIS — J90 Pleural effusion, not elsewhere classified: Secondary | ICD-10-CM | POA: Diagnosis not present

## 2017-12-22 DIAGNOSIS — Z833 Family history of diabetes mellitus: Secondary | ICD-10-CM

## 2017-12-22 DIAGNOSIS — J9811 Atelectasis: Secondary | ICD-10-CM | POA: Diagnosis not present

## 2017-12-22 DIAGNOSIS — I200 Unstable angina: Secondary | ICD-10-CM | POA: Diagnosis not present

## 2017-12-22 DIAGNOSIS — Z01818 Encounter for other preprocedural examination: Secondary | ICD-10-CM | POA: Diagnosis not present

## 2017-12-22 DIAGNOSIS — R0600 Dyspnea, unspecified: Secondary | ICD-10-CM

## 2017-12-22 DIAGNOSIS — I10 Essential (primary) hypertension: Secondary | ICD-10-CM | POA: Diagnosis not present

## 2017-12-22 DIAGNOSIS — I255 Ischemic cardiomyopathy: Secondary | ICD-10-CM | POA: Diagnosis present

## 2017-12-22 DIAGNOSIS — I517 Cardiomegaly: Secondary | ICD-10-CM | POA: Diagnosis not present

## 2017-12-22 DIAGNOSIS — I959 Hypotension, unspecified: Secondary | ICD-10-CM | POA: Diagnosis not present

## 2017-12-22 DIAGNOSIS — R4020 Unspecified coma: Secondary | ICD-10-CM | POA: Diagnosis not present

## 2017-12-22 DIAGNOSIS — I340 Nonrheumatic mitral (valve) insufficiency: Secondary | ICD-10-CM | POA: Diagnosis not present

## 2017-12-22 HISTORY — PX: LEFT HEART CATH AND CORONARY ANGIOGRAPHY: CATH118249

## 2017-12-22 LAB — SPIROMETRY WITH GRAPH
FEF 25-75 Pre: 1.8 L/sec
FEF2575-%Pred-Pre: 65 %
FEV1-%Pred-Pre: 85 %
FEV1-Pre: 2.9 L
FEV1FVC-%Pred-Pre: 103 %
FEV6-%Pred-Pre: 84 %
FEV6-Pre: 3.63 L
FEV6FVC-%Pred-Pre: 101 %
FVC-%Pred-Pre: 83 %
FVC-Pre: 3.76 L
Pre FEV1/FVC ratio: 77 %
Pre FEV6/FVC Ratio: 97 %

## 2017-12-22 LAB — POCT I-STAT 3, ART BLOOD GAS (G3+)
ACID-BASE DEFICIT: 2 mmol/L (ref 0.0–2.0)
ACID-BASE DEFICIT: 3 mmol/L — AB (ref 0.0–2.0)
BICARBONATE: 22.2 mmol/L (ref 20.0–28.0)
Bicarbonate: 22.2 mmol/L (ref 20.0–28.0)
O2 Saturation: 81 %
O2 Saturation: 97 %
PH ART: 7.379 (ref 7.350–7.450)
PH ART: 7.403 (ref 7.350–7.450)
PO2 ART: 44 mmHg — AB (ref 83.0–108.0)
PO2 ART: 96 mmHg (ref 83.0–108.0)
Patient temperature: 98.2
TCO2: 23 mmol/L (ref 22–32)
TCO2: 23 mmol/L (ref 22–32)
pCO2 arterial: 35.6 mmHg (ref 32.0–48.0)
pCO2 arterial: 37.6 mmHg (ref 32.0–48.0)

## 2017-12-22 LAB — TROPONIN I: Troponin I: 0.03 ng/mL (ref <0.03)

## 2017-12-22 LAB — BASIC METABOLIC PANEL
Anion gap: 15 (ref 5–15)
BUN: 21 mg/dL (ref 8–23)
CHLORIDE: 107 mmol/L (ref 98–111)
CO2: 18 mmol/L — AB (ref 22–32)
CREATININE: 1.19 mg/dL (ref 0.61–1.24)
Calcium: 9.3 mg/dL (ref 8.9–10.3)
GFR calc Af Amer: >60 mL/min (ref >60)
GFR calc non Af Amer: >60 mL/min (ref >60)
Glucose, Bld: 128 mg/dL — ABNORMAL HIGH (ref 70–99)
Potassium: 3.6 mmol/L (ref 3.5–5.1)
Sodium: 140 mmol/L (ref 135–145)

## 2017-12-22 LAB — CBC
HEMATOCRIT: 45.5 % (ref 39.0–52.0)
HEMOGLOBIN: 15.3 g/dL (ref 13.0–17.0)
MCH: 30.4 pg (ref 26.0–34.0)
MCHC: 33.6 g/dL (ref 30.0–36.0)
MCV: 90.5 fL (ref 78.0–100.0)
Platelets: 204 10*3/uL (ref 150–400)
RBC: 5.03 MIL/uL (ref 4.22–5.81)
RDW: 14 % (ref 11.5–15.5)
WBC: 10.4 10*3/uL (ref 4.0–10.5)

## 2017-12-22 LAB — SURGICAL PCR SCREEN
MRSA, PCR: NEGATIVE
Staphylococcus aureus: NEGATIVE

## 2017-12-22 SURGERY — LEFT HEART CATH AND CORONARY ANGIOGRAPHY
Anesthesia: LOCAL

## 2017-12-22 MED ORDER — HEPARIN SODIUM (PORCINE) 1000 UNIT/ML IJ SOLN
INTRAMUSCULAR | Status: DC | PRN
  Administered 2017-12-22: 4000 [IU] via INTRAVENOUS

## 2017-12-22 MED ORDER — PLASMA-LYTE 148 IV SOLN
INTRAVENOUS | Status: DC
  Filled 2017-12-22: qty 2.5

## 2017-12-22 MED ORDER — PHENYLEPHRINE HCL-NACL 20-0.9 MG/250ML-% IV SOLN
30.0000 ug/min | INTRAVENOUS | Status: AC
  Administered 2017-12-23: 10 ug/min via INTRAVENOUS
  Filled 2017-12-22: qty 250

## 2017-12-22 MED ORDER — FENTANYL CITRATE (PF) 100 MCG/2ML IJ SOLN
INTRAMUSCULAR | Status: DC | PRN
  Administered 2017-12-22: 25 ug via INTRAVENOUS

## 2017-12-22 MED ORDER — ASPIRIN 81 MG PO CHEW
CHEWABLE_TABLET | ORAL | Status: AC
  Administered 2017-12-22: 10:00:00
  Filled 2017-12-22: qty 4

## 2017-12-22 MED ORDER — SODIUM CHLORIDE 0.9 % IV SOLN
INTRAVENOUS | Status: AC
  Administered 2017-12-23: .8 [IU]/h via INTRAVENOUS
  Filled 2017-12-22: qty 1

## 2017-12-22 MED ORDER — SODIUM CHLORIDE 0.9 % IV SOLN
1.5000 g | INTRAVENOUS | Status: AC
  Administered 2017-12-23: 1.5 g via INTRAVENOUS
  Filled 2017-12-22: qty 1.5

## 2017-12-22 MED ORDER — CHLORHEXIDINE GLUCONATE 4 % EX LIQD
60.0000 mL | Freq: Once | CUTANEOUS | Status: AC
  Administered 2017-12-23: 4 via TOPICAL
  Filled 2017-12-22: qty 60

## 2017-12-22 MED ORDER — TRANEXAMIC ACID (OHS) PUMP PRIME SOLUTION
2.0000 mg/kg | INTRAVENOUS | Status: DC
  Filled 2017-12-22: qty 1.58

## 2017-12-22 MED ORDER — MILRINONE LACTATE IN DEXTROSE 20-5 MG/100ML-% IV SOLN
0.3000 ug/kg/min | INTRAVENOUS | Status: DC
  Filled 2017-12-22: qty 100

## 2017-12-22 MED ORDER — DOPAMINE-DEXTROSE 3.2-5 MG/ML-% IV SOLN
0.0000 ug/kg/min | INTRAVENOUS | Status: DC
  Filled 2017-12-22: qty 250

## 2017-12-22 MED ORDER — DEXMEDETOMIDINE HCL IN NACL 400 MCG/100ML IV SOLN
0.1000 ug/kg/h | INTRAVENOUS | Status: DC
  Filled 2017-12-22: qty 100

## 2017-12-22 MED ORDER — SODIUM CHLORIDE 0.9% FLUSH: 3.0000 mL | INTRAVENOUS | Status: DC | PRN

## 2017-12-22 MED ORDER — IOHEXOL 350 MG/ML SOLN
INTRAVENOUS | Status: DC | PRN
  Administered 2017-12-22: 60 mL

## 2017-12-22 MED ORDER — LIDOCAINE HCL (PF) 1 % IJ SOLN
INTRAMUSCULAR | Status: AC
  Filled 2017-12-22: qty 30

## 2017-12-22 MED ORDER — ALPRAZOLAM 0.25 MG PO TABS: 0.2500 mg | ORAL_TABLET | ORAL | Status: DC | PRN

## 2017-12-22 MED ORDER — CHLORHEXIDINE GLUCONATE 0.12 % MT SOLN
15.0000 mL | Freq: Once | OROMUCOSAL | Status: AC
  Administered 2017-12-23: 15 mL via OROMUCOSAL
  Filled 2017-12-22: qty 15

## 2017-12-22 MED ORDER — ACETAMINOPHEN 325 MG PO TABS
650.0000 mg | ORAL_TABLET | ORAL | Status: DC | PRN
  Administered 2017-12-22 – 2017-12-23 (×2): 650 mg via ORAL
  Filled 2017-12-22 (×2): qty 2

## 2017-12-22 MED ORDER — AMIODARONE HCL IN DEXTROSE 360-4.14 MG/200ML-% IV SOLN
30.0000 mg/h | INTRAVENOUS | Status: DC
  Administered 2017-12-22 – 2017-12-23 (×2): 30 mg/h via INTRAVENOUS
  Filled 2017-12-22 (×2): qty 200

## 2017-12-22 MED ORDER — AMIODARONE HCL IN DEXTROSE 360-4.14 MG/200ML-% IV SOLN: 60.0000 mg/h | INTRAVENOUS | Status: DC

## 2017-12-22 MED ORDER — SODIUM CHLORIDE 0.9% FLUSH: 3.0000 mL | Freq: Two times a day (BID) | INTRAVENOUS | Status: DC

## 2017-12-22 MED ORDER — TRANEXAMIC ACID (OHS) BOLUS VIA INFUSION
15.0000 mg/kg | INTRAVENOUS | Status: DC
  Filled 2017-12-22: qty 1184

## 2017-12-22 MED ORDER — VERAPAMIL HCL 2.5 MG/ML IV SOLN
INTRAVENOUS | Status: AC
  Filled 2017-12-22: qty 2

## 2017-12-22 MED ORDER — VERAPAMIL HCL 2.5 MG/ML IV SOLN
INTRAVENOUS | Status: DC | PRN
  Administered 2017-12-22: 10 mL via INTRA_ARTERIAL

## 2017-12-22 MED ORDER — SODIUM CHLORIDE 0.9 % IV SOLN: 250.0000 mL | INTRAVENOUS | Status: DC | PRN

## 2017-12-22 MED ORDER — NITROGLYCERIN IN D5W 200-5 MCG/ML-% IV SOLN
2.0000 ug/min | INTRAVENOUS | Status: DC
  Filled 2017-12-22: qty 250

## 2017-12-22 MED ORDER — SODIUM CHLORIDE 0.9 % IV SOLN
INTRAVENOUS | Status: AC
  Administered 2017-12-22: 12:00:00 via INTRAVENOUS

## 2017-12-22 MED ORDER — HEPARIN SODIUM (PORCINE) 5000 UNIT/ML IJ SOLN
4000.0000 [IU] | Freq: Once | INTRAMUSCULAR | Status: AC
  Administered 2017-12-22: 4000 [IU] via INTRAVENOUS

## 2017-12-22 MED ORDER — CHLORHEXIDINE GLUCONATE 4 % EX LIQD
60.0000 mL | Freq: Once | CUTANEOUS | Status: AC
  Administered 2017-12-22: 4 via TOPICAL
  Filled 2017-12-22: qty 60

## 2017-12-22 MED ORDER — TECHNETIUM TC 99M TETROFOSMIN IV KIT
10.0000 | PACK | Freq: Once | INTRAVENOUS | Status: AC | PRN
  Administered 2017-12-22: 9.8 via INTRAVENOUS

## 2017-12-22 MED ORDER — FENTANYL CITRATE (PF) 100 MCG/2ML IJ SOLN
100.0000 ug | Freq: Once | INTRAMUSCULAR | Status: AC
  Administered 2017-12-22: 100 ug via INTRAVENOUS
  Filled 2017-12-22: qty 2

## 2017-12-22 MED ORDER — BISACODYL 5 MG PO TBEC: 5.0000 mg | DELAYED_RELEASE_TABLET | Freq: Once | ORAL | Status: DC

## 2017-12-22 MED ORDER — DEXMEDETOMIDINE HCL IN NACL 400 MCG/100ML IV SOLN
0.1000 ug/kg/h | INTRAVENOUS | Status: AC
  Administered 2017-12-23: .4 ug/kg/h via INTRAVENOUS
  Filled 2017-12-22: qty 100

## 2017-12-22 MED ORDER — HEPARIN (PORCINE) IN NACL 100-0.45 UNIT/ML-% IJ SOLN
1000.0000 [IU]/h | INTRAMUSCULAR | Status: DC
  Administered 2017-12-22: 1000 [IU]/h via INTRAVENOUS

## 2017-12-22 MED ORDER — POTASSIUM CHLORIDE 2 MEQ/ML IV SOLN
80.0000 meq | INTRAVENOUS | Status: DC
  Filled 2017-12-22: qty 40

## 2017-12-22 MED ORDER — VANCOMYCIN HCL 10 G IV SOLR
1250.0000 mg | INTRAVENOUS | Status: AC
  Administered 2017-12-23: 1250 mg via INTRAVENOUS
  Filled 2017-12-22: qty 1250

## 2017-12-22 MED ORDER — ATORVASTATIN CALCIUM 80 MG PO TABS
80.0000 mg | ORAL_TABLET | Freq: Every day | ORAL | Status: DC
  Administered 2017-12-22: 80 mg via ORAL
  Filled 2017-12-22: qty 1

## 2017-12-22 MED ORDER — MAGNESIUM SULFATE 50 % IJ SOLN
40.0000 meq | INTRAMUSCULAR | Status: DC
  Filled 2017-12-22: qty 9.85

## 2017-12-22 MED ORDER — ONDANSETRON HCL 4 MG/2ML IJ SOLN: 4.0000 mg | Freq: Once | INTRAMUSCULAR | Status: DC | PRN

## 2017-12-22 MED ORDER — AMIODARONE LOAD VIA INFUSION
150.0000 mg | Freq: Once | INTRAVENOUS | Status: DC
  Filled 2017-12-22: qty 83.34

## 2017-12-22 MED ORDER — MIDAZOLAM HCL 2 MG/2ML IJ SOLN
INTRAMUSCULAR | Status: AC
  Filled 2017-12-22: qty 2

## 2017-12-22 MED ORDER — SODIUM CHLORIDE 0.9% FLUSH
INTRAVENOUS | Status: AC
  Filled 2017-12-22: qty 10

## 2017-12-22 MED ORDER — SODIUM CHLORIDE 0.9 % IV SOLN
INTRAVENOUS | Status: DC
  Filled 2017-12-22: qty 30

## 2017-12-22 MED ORDER — ONDANSETRON HCL 4 MG/2ML IJ SOLN
4.0000 mg | Freq: Four times a day (QID) | INTRAMUSCULAR | Status: DC | PRN
  Filled 2017-12-22: qty 2

## 2017-12-22 MED ORDER — AMIODARONE HCL IN DEXTROSE 360-4.14 MG/200ML-% IV SOLN: 30.0000 mg/h | INTRAVENOUS | Status: DC

## 2017-12-22 MED ORDER — EPINEPHRINE PF 1 MG/ML IJ SOLN
0.0000 ug/min | INTRAVENOUS | Status: DC
  Filled 2017-12-22: qty 4

## 2017-12-22 MED ORDER — TECHNETIUM TC 99M TETROFOSMIN IV KIT
30.0000 | PACK | Freq: Once | INTRAVENOUS | Status: AC | PRN
  Administered 2017-12-22: 32.8 via INTRAVENOUS

## 2017-12-22 MED ORDER — NITROGLYCERIN IN D5W 200-5 MCG/ML-% IV SOLN
10.0000 ug/min | Freq: Once | INTRAVENOUS | Status: AC
  Administered 2017-12-22: 10 ug/min via INTRAVENOUS
  Filled 2017-12-22: qty 250

## 2017-12-22 MED ORDER — TEMAZEPAM 15 MG PO CAPS
15.0000 mg | ORAL_CAPSULE | Freq: Once | ORAL | Status: AC | PRN
  Administered 2017-12-22: 15 mg via ORAL
  Filled 2017-12-22: qty 1

## 2017-12-22 MED ORDER — HEPARIN (PORCINE) IN NACL 100-0.45 UNIT/ML-% IJ SOLN
1000.0000 [IU]/h | Freq: Once | INTRAMUSCULAR | Status: AC
  Administered 2017-12-22: 1000 [IU]/h via INTRAVENOUS

## 2017-12-22 MED ORDER — SODIUM CHLORIDE 0.9 % IV SOLN
750.0000 mg | INTRAVENOUS | Status: DC
  Filled 2017-12-22: qty 750

## 2017-12-22 MED ORDER — SODIUM CHLORIDE 0.9 % IV SOLN
1.5000 g | INTRAVENOUS | Status: DC
  Filled 2017-12-22: qty 1.5

## 2017-12-22 MED ORDER — DIAZEPAM 2 MG PO TABS
2.0000 mg | ORAL_TABLET | Freq: Once | ORAL | Status: AC
  Administered 2017-12-23: 2 mg via ORAL
  Filled 2017-12-22: qty 1

## 2017-12-22 MED ORDER — SODIUM CHLORIDE 0.9 % IV SOLN
INTRAVENOUS | Status: DC
  Filled 2017-12-22: qty 1

## 2017-12-22 MED ORDER — AMIODARONE IV BOLUS ONLY 150 MG/100ML
INTRAVENOUS | Status: AC
  Filled 2017-12-22: qty 100

## 2017-12-22 MED ORDER — FENTANYL CITRATE (PF) 100 MCG/2ML IJ SOLN: 50.0000 ug | Freq: Once | INTRAMUSCULAR | Status: DC | PRN

## 2017-12-22 MED ORDER — HEPARIN (PORCINE) IN NACL 100-0.45 UNIT/ML-% IJ SOLN: 14.0000 [IU]/kg/h | Freq: Once | INTRAMUSCULAR | Status: DC

## 2017-12-22 MED ORDER — METOPROLOL TARTRATE 12.5 MG HALF TABLET: 12.5000 mg | ORAL_TABLET | Freq: Once | ORAL | Status: DC

## 2017-12-22 MED ORDER — SODIUM CHLORIDE 0.9 % IV SOLN
INTRAVENOUS | Status: DC
  Administered 2017-12-22: 16:00:00 via INTRAVENOUS

## 2017-12-22 MED ORDER — TRANEXAMIC ACID 1000 MG/10ML IV SOLN
1.5000 mg/kg/h | INTRAVENOUS | Status: AC
  Administered 2017-12-23: 1.5 mg/kg/h via INTRAVENOUS
  Filled 2017-12-22: qty 25

## 2017-12-22 MED ORDER — TRANEXAMIC ACID (OHS) BOLUS VIA INFUSION
15.0000 mg/kg | INTRAVENOUS | Status: AC
  Administered 2017-12-23: 1183.5 mg via INTRAVENOUS
  Filled 2017-12-22: qty 1184

## 2017-12-22 MED ORDER — HEPARIN (PORCINE) IN NACL 1000-0.9 UT/500ML-% IV SOLN
INTRAVENOUS | Status: AC
  Filled 2017-12-22: qty 1000

## 2017-12-22 MED ORDER — HEPARIN (PORCINE) IN NACL 1000-0.9 UT/500ML-% IV SOLN
INTRAVENOUS | Status: DC | PRN
  Administered 2017-12-22 (×2): 500 mL

## 2017-12-22 MED ORDER — VANCOMYCIN HCL 10 G IV SOLR
1250.0000 mg | INTRAVENOUS | Status: DC
  Filled 2017-12-22: qty 1250

## 2017-12-22 MED ORDER — LIDOCAINE HCL (PF) 1 % IJ SOLN
INTRAMUSCULAR | Status: DC | PRN
  Administered 2017-12-22: 2 mL via INTRADERMAL

## 2017-12-22 MED ORDER — NITROGLYCERIN 0.4 MG SL SUBL: 0.4000 mg | SUBLINGUAL_TABLET | SUBLINGUAL | Status: DC | PRN

## 2017-12-22 MED ORDER — MILRINONE LACTATE IN DEXTROSE 20-5 MG/100ML-% IV SOLN
0.3000 ug/kg/min | INTRAVENOUS | Status: AC
  Administered 2017-12-23: 0.25 ug/kg/min via INTRAVENOUS
  Filled 2017-12-22: qty 100

## 2017-12-22 MED ORDER — NITROGLYCERIN IN D5W 200-5 MCG/ML-% IV SOLN
2.0000 ug/min | INTRAVENOUS | Status: AC
  Administered 2017-12-23: 16.6 ug/min via INTRAVENOUS
  Filled 2017-12-22: qty 250

## 2017-12-22 MED ORDER — AMIODARONE HCL IN DEXTROSE 360-4.14 MG/200ML-% IV SOLN
60.0000 mg/h | INTRAVENOUS | Status: AC
  Administered 2017-12-22: 60 mg/h via INTRAVENOUS

## 2017-12-22 MED ORDER — SODIUM CHLORIDE 0.9 % IV SOLN
30.0000 ug/min | INTRAVENOUS | Status: DC
  Filled 2017-12-22: qty 2

## 2017-12-22 MED ORDER — MIDAZOLAM HCL 2 MG/2ML IJ SOLN
INTRAMUSCULAR | Status: DC | PRN
  Administered 2017-12-22: 1 mg via INTRAVENOUS

## 2017-12-22 MED ORDER — PLASMA-LYTE 148 IV SOLN
INTRAVENOUS | Status: AC
  Administered 2017-12-23: 500 mL
  Filled 2017-12-22: qty 2.5

## 2017-12-22 MED ORDER — REGADENOSON 0.4 MG/5ML IV SOLN
INTRAVENOUS | Status: AC
  Filled 2017-12-22: qty 5

## 2017-12-22 MED ORDER — SODIUM CHLORIDE 0.9% FLUSH
3.0000 mL | Freq: Two times a day (BID) | INTRAVENOUS | Status: DC
  Administered 2017-12-22: 3 mL via INTRAVENOUS

## 2017-12-22 MED ORDER — FENTANYL CITRATE (PF) 100 MCG/2ML IJ SOLN
INTRAMUSCULAR | Status: AC
  Filled 2017-12-22: qty 2

## 2017-12-22 MED ORDER — ASPIRIN EC 81 MG PO TBEC: 81.0000 mg | DELAYED_RELEASE_TABLET | Freq: Every day | ORAL | Status: DC

## 2017-12-22 MED ORDER — HEPARIN (PORCINE) IN NACL 100-0.45 UNIT/ML-% IJ SOLN: 1000.0000 [IU]/h | INTRAMUSCULAR | Status: DC

## 2017-12-22 MED ORDER — TRANEXAMIC ACID 1000 MG/10ML IV SOLN
1.5000 mg/kg/h | INTRAVENOUS | Status: DC
  Filled 2017-12-22: qty 25

## 2017-12-22 SURGICAL SUPPLY — 11 items
CATH 5FR JL3.5 JR4 ANG PIG MP (CATHETERS) ×1 IMPLANT
DEVICE RAD COMP TR BAND LRG (VASCULAR PRODUCTS) ×1 IMPLANT
GLIDESHEATH SLEND SS 6F .021 (SHEATH) ×1 IMPLANT
GUIDEWIRE INQWIRE 1.5J.035X260 (WIRE) IMPLANT
INQWIRE 1.5J .035X260CM (WIRE) ×2
KIT HEART LEFT (KITS) ×2 IMPLANT
PACK CARDIAC CATHETERIZATION (CUSTOM PROCEDURE TRAY) ×2 IMPLANT
PAD PRO RADIOLUCENT 2001M-C (PAD) ×1 IMPLANT
SYR MEDRAD MARK V 150ML (SYRINGE) ×2 IMPLANT
TRANSDUCER W/STOPCOCK (MISCELLANEOUS) ×2 IMPLANT
TUBING CIL FLEX 10 FLL-RA (TUBING) ×2 IMPLANT

## 2017-12-22 NOTE — ED Notes (Signed)
Amiodarone 150mg  IV given per vo by edp,  Computer will not allow to scan pt

## 2017-12-22 NOTE — Progress Notes (Signed)
No further chest pain. Tylenol for mild headache. Heparin gtts restarted per order, amioderone infusing. SB 50's, no noted ectopy. SBP stable, MAP >65. NPO @ midnight, first case tomorrow morning for CABG. Consents signed by patient.

## 2017-12-22 NOTE — ED Triage Notes (Addendum)
Per staff in cardiac rehab, pt became lethargic during stress test.  States pt coded and was given 2-3 minutes of cpr by cardiac rehab. Pt was awake and alert upon ED staff arrival, [facility] rhythm observed on monitor.  Pt taken to room 2 in ED, EDP at bedside.

## 2017-12-22 NOTE — H&P (Addendum)
Cardiology Admission History and Physical:   Patient ID: Glenn [name] MRN: [id number]; DOB: Aug 20, 1955   Admission date: 12/22/2017  Primary Care Provider: Benita Stabile, MD  Primary Cardiologist: Dr Prentice Docker Primary Electrophysiologist:  na  Chief Complaint:  Chest pain  Patient Profile:   Glenn [name] is a 62 y.o. [name] with with HTN, HL, with VT arrest during exercise stress test today  History of Present Illness:   Glenn [name] 62 yo [name] history of HTN, HL seen by Dr Purvis Sheffield 12/20/17 for chest pain and fatigue. An exercise nuclear stress test was ordered. Patient on treadmill today. Baseline EKG SR with baseline and inferior lateral TWIs. With exercise developed 4mm downsloping ST depressions inferior and lateral leads. At this time treadmill was stopped, right after stopping patient became suddenly unresponsive and was layed to the floor. He had no pulse, CPR was started. Approximately 3 minutes of CPR, rhythm showed VT and he was defbrillated with a single shock to NSR. BP 130/70s, he became responsive and was protecting his airway. Was taken urgently to ER      Past Medical History:  Diagnosis Date  . Essential hypertension   . Hyperlipidemia   . Impaired fasting glucose   . Other fatigue   . Palpitations     Past Surgical History:  Procedure Laterality Date  . SHOULDER ACROMIOPLASTY  07/01/2017     Medications Prior to Admission: Prior to Admission medications   Medication Sig Start Date End Date Taking? Authorizing Provider  aspirin EC 81 MG tablet Take 81 mg by mouth daily.    [provider]  escitalopram (LEXAPRO) 10 MG tablet Take 10 mg by mouth daily.    [provider]  losartan (COZAAR) 25 MG tablet Take 25 mg by mouth daily.    [provider]  ranitidine (ZANTAC) 75 MG tablet Take 75 mg by mouth daily.    [provider]     Allergies:   No Known Allergies  Social History:   Social History    Socioeconomic History  . Marital status: Married    Spouse name: Not on file  . Number of children: Not on file  . Years of education: Not on file  . Highest education level: Not on file  Occupational History  . Not on file  Social Needs  . Financial resource strain: Not on file  . Food insecurity:    Worry: Not on file    Inability: Not on file  . Transportation needs:    Medical: Not on file    Non-medical: Not on file  Tobacco Use  . Smoking status: Former Games developer  . Smokeless tobacco: Never Used  Substance and Sexual Activity  . Alcohol use: Yes    Comment: on occasion  . Drug use: No  . Sexual activity: Not on file  Lifestyle  . Physical activity:    Days per week: Not on file    Minutes per session: Not on file  . Stress: Not on file  Relationships  . Social connections:    Talks on phone: Not on file    Gets together: Not on file    Attends religious service: Not on file    Active member of club or organization: Not on file    Attends meetings of clubs or organizations: Not on file    Relationship status: Not on file  . Intimate partner violence:    Fear of current or ex partner: Not on file  Emotionally abused: Not on file    Physically abused: Not on file    Forced sexual activity: Not on file  Other Topics Concern  . Not on file  Social History Narrative  . Not on file    Family History:  The patient's family history includes Cancer in his father; Diabetes in his mother; Heart disease in his brother and mother; Hypertension in his mother.    ROS:  Unable to collect, patient not anwering questions.      Physical Exam/Data:  There were no vitals filed for this visit. No intake or output data in the 24 hours ending 12/22/17 0946 There were no vitals filed for this visit. There is no height or weight on file to calculate BMI.  General:  Well nourished, well developed, in no acute distress HEENT: normal Lymph: no adenopathy Endocrine:  No  thryomegaly Cardiac:  normal S1, S2; RRR; no murmur Lungs:  clear to auscultation bilaterally, no wheezing, rhonchi or rales  Abd: soft, nontender, no hepatomegaly  Ext: no edema Musculoskeletal:  No deformities, BUE and BLE strength normal and equal Skin: warm and dry  Neuro:  CNs 2-12 intact, no focal abnormalities noted Psych:  Normal affect    Laboratory Data:  ChemistryNo results for input(s): NA, K, CL, CO2, GLUCOSE, BUN, CREATININE, CALCIUM, GFRNONAA, GFRAA, ANIONGAP in the last 168 hours.  No results for input(s): PROT, ALBUMIN, AST, ALT, ALKPHOS, BILITOT in the last 168 hours. HematologyNo results for input(s): WBC, RBC, HGB, HCT, MCV, MCH, MCHC, RDW, PLT in the last 168 hours. Cardiac EnzymesNo results for input(s): TROPONINI in the last 168 hours. No results for input(s): TROPIPOC in the last 168 hours.  BNPNo results for input(s): BNP, PROBNP in the last 168 hours.  DDimer No results for input(s): DDIMER in the last 168 hours.  Radiology/Studies:  No results found.  Assessment and Plan:   1. VT arrest - occurred while on treadmill stress test, significant ST depressions prior to arrest. His VT appears to be ischemia mediated. Mainting SR after defibrillation.Post arrest EKG SR, deep diffuse TWIs  We will start amiodarone short term, but main management is cath and ischemic evaluation. He did receive amio 150mg  in ER, nursing has had issues documenting through epic - stabilization per ER staff, we will plan for transfer to [place] for cath today - will need echo this admit   Have been in touch with [facility] cath lab, patient to be transferred at this time for cath.        For questions or updates, please contact [facility] Please consult [email or web address] for contact info under        Signed, Dina Rich, MD  12/22/2017 9:46 AM

## 2017-12-22 NOTE — Progress Notes (Signed)
  Echocardiogram 2D Echocardiogram has been performed.  Gerda Diss 12/22/2017, 5:41 PM

## 2017-12-22 NOTE — Consult Note (Addendum)
[address]       [place] [address]             [phone]        RIGOVERTO STONEBARGER [facility] Medical Record #[id number] Date of Birth: 1955/05/08  Referring: No ref. provider found Primary Care: Benita Stabile, MD Primary Cardiologist:No primary care provider on file.  Chief Complaint:    Chief Complaint  Patient presents with  . Cardiac Arrest    History of Present Illness:    Patient is a 62 year old male with cardiac risk factors including hyperlipidemia and hypertension who was recently seen by Dr. Purvis Sheffield on 12/20/2017 for chest pain and fatigue symptoms.  An exercise nuclear stress test was obtained and while on the treadmill the patient had findings of 4 mm downsloping ST depressions in the inferior and lateral leads.  Patient also noted a baseline EKG that had some inferior lateral T wave inversions.  Due to this the treadmill was stopped and after stopping the patient became suddenly unresponsive and was placed on the floor.  He was found to be in complete cardiac arrest can CPR was initiated.  This occurred for approximately 3 minutes and rhythm then showed ventricular tachycardia and he was promptly defibrillated a single shock reverting the patient back to sinus rhythm.  The patient then became responsive and was able to protect his airway.  He was taken promptly to the emergency department.  He was stabilized and then taken to the cardiac catheterization lab for left heart catheterization and coronary angiography.  He was found to have severe multivessel coronary artery disease.  Please see the cath report listed below.  We are asked to see the patient in cardiothoracic surgical consultation for consideration of coronary artery surgical revascularization.    Current Activity/ Functional Status: Patient is independent with mobility/ambulation, transfers, ADL's, IADL's.   Zubrod Score: At the time of surgery this patient's most appropriate activity  status/level should be described as: []     0    Normal activity, no symptoms [x]     1    Restricted in physical strenuous activity but ambulatory, able to do out light work []     2    Ambulatory and capable of self care, unable to do work activities, up and about                 more than 50%  Of the time                            []     3    Only limited self care, in bed greater than 50% of waking hours []     4    Completely disabled, no self care, confined to bed or chair []     5    Moribund  Past Medical History:  Diagnosis Date  . Essential hypertension   . Hyperlipidemia   . Impaired fasting glucose   . Other fatigue   . Palpitations     Past Surgical History:  Procedure Laterality Date  . SHOULDER ACROMIOPLASTY  07/01/2017    Social History   Tobacco Use  Smoking Status Former Smoker  Smokeless Tobacco Never Used    Social History   Substance and Sexual Activity  Alcohol Use Yes   Comment: on occasion     No Known Allergies  Current Facility-Administered Medications  Medication Dose Route Frequency Provider Last  Rate Last Dose  . 0.9 %  sodium chloride infusion   Intravenous Continuous End, Christopher, MD 75 mL/hr at 12/22/17 1220    . 0.9 %  sodium chloride infusion  250 mL Intravenous PRN End, Cristal Deer, MD      . Mitzi Hansen Hold] acetaminophen (TYLENOL) tablet 650 mg  650 mg Oral Q4H PRN [place] Ouch, [place] M, PA-C      . amiodarone (NEXTERONE PREMIX) 360-4.14 MG/200ML-% (1.8 mg/mL) IV infusion  60 mg/hr Intravenous Continuous Branch, Dorothe Pea, MD      . amiodarone (NEXTERONE PREMIX) 360-4.14 MG/200ML-% (1.8 mg/mL) IV infusion  30 mg/hr Intravenous Continuous Antoine Poche, MD      . amiodarone (NEXTERONE) 150-4.21 MG/100ML-% bolus           . [MAR Hold] aspirin EC tablet 81 mg  81 mg Oral Daily Strader, [place] M, [place]      . [MAR Hold] fentaNYL (SUBLIMAZE) injection 50 mcg  50 mcg Intravenous Once PRN Raeford Razor, MD      . fentaNYL (SUBLIMAZE)  injection    PRN Yvonne Kendall, MD   25 mcg at 12/22/17 1138  . Heparin (Porcine) in NaCl 1000-0.9 UT/500ML-% SOLN    PRN End, Cristal Deer, MD   500 mL at 12/22/17 1153  . heparin ADULT infusion 100 units/mL (25000 units/253mL sodium chloride 0.45%)  1,000 Units/hr Intravenous Continuous Branch, Dorothe Pea, MD      . heparin injection    PRN Yvonne Kendall, MD   4,000 Units at 12/22/17 1142  . iohexol (OMNIPAQUE) 350 MG/ML injection    PRN End, Cristal Deer, MD   60 mL at 12/22/17 1159  . lidocaine (PF) (XYLOCAINE) 1 % injection    PRN End, Cristal Deer, MD   2 mL at 12/22/17 1140  . midazolam (VERSED) injection    PRN End, Cristal Deer, MD   1 mg at 12/22/17 1138  . [MAR Hold] nitroGLYCERIN (NITROSTAT) SL tablet 0.4 mg  0.4 mg Sublingual Q5 Min x 3 PRN Strader, [place] M, PA-C      . [MAR Hold] ondansetron (ZOFRAN) injection 4 mg  4 mg Intravenous Q6H PRN Strader, Lennart Pall, PA-C      . [MAR Hold] ondansetron (ZOFRAN) injection 4 mg  4 mg Intravenous Once PRN Raeford Razor, MD      . Radial Cocktail/Verapamil only    PRN End, Cristal Deer, MD   10 mL at 12/22/17 1141  . sodium chloride flush (NS) 0.9 % injection 3 mL  3 mL Intravenous Q12H End, Christopher, MD      . sodium chloride flush (NS) 0.9 % injection 3 mL  3 mL Intravenous PRN End, Cristal Deer, MD       Facility-Administered Medications Ordered in Other Encounters  Medication Dose Route Frequency Provider Last Rate Last Dose  . sodium chloride flush (NS) 0.9 % injection             Medications Prior to Admission  Medication Sig Dispense Refill Last Dose  . aspirin EC 81 MG tablet Take 81 mg by mouth daily.   Taking  . escitalopram (LEXAPRO) 10 MG tablet Take 10 mg by mouth daily.   Taking  . losartan (COZAAR) 25 MG tablet Take 25 mg by mouth daily.   Taking  . ranitidine (ZANTAC) 75 MG tablet Take 75 mg by mouth daily.   Taking    Family History  Problem Relation Age of Onset  . Diabetes Mother   . Hypertension Mother   .  Heart  disease Mother   . Cancer Father   . Heart disease Brother      Review of Systems:   Review of Systems  Constitutional: Positive for malaise/fatigue and weight loss. Negative for chills, diaphoresis and fever.  HENT: Positive for congestion and hearing loss. Negative for ear discharge, ear pain, nosebleeds, sinus pain, sore throat and tinnitus.   Eyes: Positive for blurred vision. Negative for double vision, photophobia, pain, discharge and redness.  Respiratory: Positive for cough, sputum production and shortness of breath. Negative for hemoptysis, wheezing and stridor.   Cardiovascular: Positive for chest pain and palpitations. Negative for orthopnea, claudication, leg swelling and PND.  Gastrointestinal: Positive for abdominal pain, blood in stool, constipation, diarrhea and heartburn. Negative for melena, nausea and vomiting.       + hemorrhoids  Genitourinary: Positive for flank pain, frequency and urgency. Negative for dysuria and hematuria.  Musculoskeletal: Positive for back pain, joint pain, myalgias and neck pain. Negative for falls.  Skin: Positive for itching. Negative for rash.  Neurological: Positive for dizziness, tingling and loss of consciousness. Negative for tremors, sensory change, speech change, focal weakness, seizures, weakness and headaches.  Endo/Heme/Allergies: Positive for environmental allergies and polydipsia. Bruises/bleeds easily.  Psychiatric/Behavioral: Positive for memory loss. Negative for depression, hallucinations, substance abuse and suicidal ideas. The patient is nervous/anxious and has insomnia.       Physical Exam: BP 112/70   Pulse (!) 53   Resp 16   Ht 5\' 9"  (1.753 m)   Wt 78.9 kg   SpO2 100%   BMI 25.70 kg/m   Physical Exam  Constitutional: He is oriented to person, place, and time. He appears well-developed and well-nourished. No distress.  HENT:  Head: Normocephalic and atraumatic.  Mouth/Throat: Oropharynx is clear and moist.  No oropharyngeal exudate.  Poor dentition  Eyes: Pupils are equal, round, and reactive to light. Conjunctivae and EOM are normal. Right eye exhibits no discharge. Left eye exhibits no discharge. No scleral icterus.  Neck: No JVD present. No tracheal deviation present. No thyromegaly present.  Cardiovascular: Normal rate, regular rhythm, normal heart sounds and intact distal pulses. Exam reveals no gallop and no friction rub.  No murmur heard. Pulmonary/Chest: No stridor. No respiratory distress. He has no wheezes. He has no rales. He exhibits no tenderness.  Abdominal: Soft. Bowel sounds are normal. He exhibits no distension and no mass. There is no tenderness. There is no rebound and no guarding.  Musculoskeletal: Normal range of motion. He exhibits no edema, tenderness or deformity.  Lymphadenopathy:    He has no cervical adenopathy.  Neurological: He is alert and oriented to person, place, and time. No cranial nerve deficit. He exhibits normal muscle tone.  Skin: Skin is dry. No rash noted. He is not diaphoretic. No erythema. No pallor.  Psychiatric: He has a normal mood and affect. His behavior is normal. Judgment and thought content normal.       Diagnostic Studies & Laboratory data:     Recent Radiology Findings:   No results found.   Conclusions: 1. Critical multivessel CAD, as detailed below. 2. Severely reduced left ventricular systolic function with global hypokinesis and basal/mid inferior akinesis.  LVEF 30-35%. 3. Normal left ventricular filling pressure.  Recommendations: 1. Admit to cardiac ICU for urgent cardiac surgery consultation for CABG. 2. Initiate heparin infusion 2 hours after TR band deflation. 3. Aggressive secondary prevention. 4. Obtain transthoracic echocardiogram today.  Recommend uninterrupted dual antiplatelet therapy with Aspirin 81mg  daily and Clopidogrel 75mg   daily for a minimum of 12 months (ACS - Class I recommendation).  Initiation of  clopidogrel can be deferred until after CABG.  Yvonne Kendall, MD [facility] Pager: [phone]   Indications   Accelerating angina (HCC) [I20.0 (ICD-10-CM)]  Cardiac arrest (HCC) [I46.9 (ICD-10-CM)]  Procedural Details/Technique   Technical Details Indication: 62 y.o. year-old man with history of hypertension and hyperlipidemia, transferred from [facility] for urgent cardiac catheterization due to accelerating angina and cardiac arrest with ventricular tachycardia during exercise stress test today.  GFR: >60 ml/min  Procedure: The risks, benefits, complications, treatment options, and expected outcomes were discussed with the patient. The patient and/or family concurred with the proposed plan, giving informed consent. The patient was brought to the cath lab after IV hydration was begun and oral premedication was given. The patient was further sedated with Versed and Fentanyl. The right wrist was assessed with a modified Allens test which was normal. The right wrist was prepped and draped in a sterile fashion. 1% lidocaine was used for local anesthesia. Using the modified Seldinger access technique, a 16F slender Glidesheath was placed in the right radial artery. 3 mg Verapamil was given through the sheath. Heparin 4,000 units were administered.  Selective coronary angiography was performed using 24F JL3.5 and JR4 catheters to engage the left and right coronary arteries, respectively. Left heart catheterization was performed using a 24F pigtail catheter. Left ventriculogram was performed with a power injection of contrast.  At the end of the procedure, the radial artery sheath was removed and a TR band applied to achieve patent hemostasis. There were no immediate complications. The patient was taken to the recovery area in stable condition.  Contrast used: 60 mL Isovue Fluoroscopy time: 4.0 min Radiation dose: 849 mGy   Estimated blood loss <50 mL.  During this procedure the  patient was administered the following to achieve and maintain moderate conscious sedation: Versed mg, Fentanyl mcg, while the patient's heart rate, blood pressure, and oxygen saturation were continuously monitored. The period of conscious sedation was 19 minutes, of which I was present face-to-face 100% of this time.  Complications   No complications were associated with this study.  Documented by Yvonne Kendall, MD - 12/22/2017 12:29 PM EDT    Coronary Findings   Diagnostic  Dominance: Right  Left Main  Vessel is large.  Dist LM to Ost LAD lesion 90% stenosed  Dist LM to Ost LAD lesion is 90% stenosed. The lesion is eccentric. The lesion is moderately calcified.  Left Anterior Descending  Prox LAD-1 lesion 50% stenosed  Prox LAD-1 lesion is 50% stenosed. The lesion is eccentric. The lesion is calcified.  Prox LAD-2 lesion 60% stenosed  Prox LAD-2 lesion is 60% stenosed.  Mid LAD to Dist LAD lesion 30% stenosed  Mid LAD to Dist LAD lesion is 30% stenosed.  First Diagonal Branch  Vessel is moderate in size.  Ost 1st Diag lesion 80% stenosed  Ost 1st Diag lesion is 80% stenosed.  Second Diagonal Branch  Vessel is small in size.  Ramus Intermedius  Vessel is moderate in size.  Ramus lesion 70% stenosed  Ramus lesion is 70% stenosed.  Left Circumflex  Vessel is large.  Prox Cx to Mid Cx lesion 60% stenosed  Prox Cx to Mid Cx lesion is 60% stenosed. The lesion is eccentric. The lesion is calcified.  Mid Cx to Dist Cx lesion 80% stenosed  Mid Cx to Dist Cx lesion is 80% stenosed.  First Biomedical engineer  Vessel is moderate in size.  Ost 1st Mrg to 1st Mrg lesion 70% stenosed  Ost 1st Mrg to 1st Mrg lesion is 70% stenosed.  Second Obtuse Marginal Branch  Vessel is moderate in size.  Third Obtuse Marginal Branch  Vessel is small in size.  Right Coronary Artery  Vessel is moderate in size. There is moderate diffuse disease throughout the vessel.  Prox RCA lesion 70%  stenosed  Prox RCA lesion is 70% stenosed.  Dist RCA lesion 99% stenosed  Dist RCA lesion is 99% stenosed.  Right Posterior Descending Artery  Collaterals  RPDA filled by collaterals from Dist LAD.    Intervention   No interventions have been documented.  Wall Motion   Resting               Left Heart   Left Ventricle There is severe left ventricular systolic dysfunction. LVEF 30-35%. LV end diastolic pressure is normal. There are LV function abnormalities.  Aortic Valve There is no aortic valve stenosis.  Coronary Diagrams   Diagnostic Diagram            I have independently reviewed the above radiologic studies and discussed with the patient   Recent Lab Findings: Lab Results  Component Value Date   WBC 10.4 12/22/2017   HGB 15.3 12/22/2017   HCT 45.5 12/22/2017   PLT 204 12/22/2017   GLUCOSE 128 (H) 12/22/2017   ALT 24 01/14/2010   AST 20 01/14/2010   NA 140 12/22/2017   K 3.6 12/22/2017   CL 107 12/22/2017   CREATININE 1.19 12/22/2017   BUN 21 12/22/2017   CO2 18 (L) 12/22/2017      Assessment / Plan: Severe multivessel coronary artery disease in the setting of progressive angina and fatigue symptoms.  Cardiac risk factors include hypertension and hyperlipidemia.  He suffered a cardiac arrest during his nuclear exercise stress test.  We plan CABG tentatively for 12/23/2017.        I  spent 55 minutes counseling the patient face to face.    Rowe Clack, PA-C 12/22/2017 1:18 PM Pager [phone]  Coronary angiogram images personally reviewed and patient examined.  Agree with recommendations by patient's cardiologist for urgent CABG for multivessel coronary disease, left main stenosis, recent cardiac arrest during stress test. Patient understands the details of surgery which we have discussed as well as the potential risks of stroke, bleeding, pulmonary problems including pleural effusion, infection, organ failure, and death.  Surgery scheduled a.m.  September 13. patient examined and medical record reviewed,agree with above note. Kathlee Nations Trigt III 12/22/2017

## 2017-12-22 NOTE — Code Documentation (Signed)
Patient presented for outpatient stress test. While on treadmill marked ST depression, while treadmill being stopped became unresponsive and pulseless. CPR was started, defib pads placed and rhythm showed VT. He was defibrillated with a single shock back to SR. BP 130/70s and responsive, patient urgently transferred to ER.   Dina Rich MD

## 2017-12-22 NOTE — Interval H&P Note (Signed)
History and Physical Interval Note:  12/22/2017 11:07 AM  Glenn [name]  has presented today for cardiac catheterization, with the diagnosis accelerating angina and cardiac arrest during stress test. The various methods of treatment have been discussed with the patient and family. After consideration of risks, benefits and other options for treatment, the patient has consented to  Procedure(s): LEFT HEART CATH AND CORONARY ANGIOGRAPHY (N/A) as a surgical intervention .  The patient's history has been reviewed, patient examined, no change in status, stable for surgery.  I have reviewed the patient's chart and labs.  Questions were answered to the patient's satisfaction.    Cath Lab Visit (complete for each Cath Lab visit)  Clinical Evaluation Leading to the Procedure:   ACS: Yes.    Non-ACS:    Anginal Classification: CCS IV  Anti-ischemic medical therapy: No Therapy  Non-Invasive Test Results: High-risk stress test findings: cardiac mortality >3%/year  Prior CABG: No previous CABG  [name]

## 2017-12-22 NOTE — Progress Notes (Signed)
ANTICOAGULATION CONSULT NOTE - Follow-Up  Pharmacy Consult for heparin Indication: Multivessel CAD  No Known Allergies  Patient Measurements: Height: 5' 9.02" (175.3 cm) Weight: 174 lb (78.9 kg) IBW/kg (Calculated) : 70.74 Heparin Dosing Weight: 79 kg  Vital Signs: Temp: 98.2 F (36.8 C) (09/12 1539) Temp Source: Oral (09/12 1539) BP: 116/69 (09/12 1539) Pulse Rate: 59 (09/12 1539)  Labs: Recent Labs    12/22/17 0956  HGB 15.3  HCT 45.5  PLT 204  CREATININE 1.19  TROPONINI 0.03*    Estimated Creatinine Clearance: 64.4 mL/min (by C-G formula based on SCr of 1.19 mg/dL).   Medical History: Past Medical History:  Diagnosis Date  . Essential hypertension   . Hyperlipidemia   . Impaired fasting glucose   . Other fatigue   . Palpitations     Medications:  Medications Prior to Admission  Medication Sig Dispense Refill Last Dose  . aspirin EC 81 MG tablet Take 81 mg by mouth daily.   Taking  . escitalopram (LEXAPRO) 10 MG tablet Take 10 mg by mouth daily.   Taking  . losartan (COZAAR) 25 MG tablet Take 25 mg by mouth daily.   Taking  . ranitidine (ZANTAC) 75 MG tablet Take 75 mg by mouth daily.   Taking    Assessment: 52 YOM who underwent VT arrest during an exercise stress test on 9/12 and was taken for emergent cath which showed multivessel CAD - now awaiting CVTS consultation for CABG. Pharmacy consulted to resume Heparin 2 hours after TR band deflation.   Per discussion with RN, the TR band was deflated at 1530 today.   Goal of Therapy:  Heparin level 0.3-0.7 units/ml Monitor platelets by anticoagulation protocol: Yes   Plan:  - Resume Heparin at 1000 units/hr starting at 1730 - Will continue to monitor for any signs/symptoms of bleeding and will follow up with heparin level in 6 hours   Thank you for allowing pharmacy to be a part of this patient's care.  Georgina Pillion, PharmD, BCPS Clinical Pharmacist Pager: [phone] Please check AMION for all  [facility] Pharmacy numbers 12/22/2017 5:19 PM

## 2017-12-22 NOTE — ED Notes (Signed)
CRITICAL VALUE ALERT  Critical Value: troponin 0.03  Date & Time Notied: 1028   Provider Notified: Strader  Orders Received/Actions taken: none

## 2017-12-22 NOTE — Progress Notes (Signed)
ANTICOAGULATION CONSULT NOTE - Initial Consult  Pharmacy Consult for heparin Indication: VT arrest  No Known Allergies  Patient Measurements: Height: 5\' 9"  (175.3 cm) Weight: 174 lb (78.9 kg) IBW/kg (Calculated) : 70.7 Heparin Dosing Weight: 79 kg  Vital Signs:    Labs: Recent Labs    12/22/17 0956  TROPONINI 0.03*    CrCl cannot be calculated (Patient's most recent lab result is older than the maximum 21 days allowed.).   Medical History: Past Medical History:  Diagnosis Date  . Essential hypertension   . Hyperlipidemia   . Impaired fasting glucose   . Other fatigue   . Palpitations     Medications:   (Not in a hospital admission)  Assessment: Pharmacy consulted to dose heparin in patient for VT arrest.  Occurred while patient was in cardiac rehab during a stress test.  Plan to transfer to [facility] for cath today.  Goal of Therapy:  Heparin level 0.3-0.7 units/ml Monitor platelets by anticoagulation protocol: Yes   Plan:  Give 4000 units bolus x 1 Start heparin infusion at 1000 units/hr Check anti-Xa level in 6 hours and daily while on heparin Continue to monitor H&H and platelets  Viviann Spare C Tramaine Sauls 12/22/2017,10:29 AM

## 2017-12-22 NOTE — ED Provider Notes (Signed)
[facility] EMERGENCY DEPARTMENT Provider Note   CSN: [id number] Arrival date & time: 12/22/17  0941     History   Chief Complaint Chief Complaint  Patient presents with  . Cardiac Arrest    HPI Glenn [name] is a 62 y.o. male.  HPI   61yM presenting directly from cardiac rehab where he was having a stress test. Had witnessed VT arrest. Significant ST depression during testing and then pt became unresponsive just after stopping. Pulseless. ~3 min of CPR. Cardioverted x1 to sinus rhythm and immediately wheeled to the ER. He arrived to the ER with cardiology beside him. He arrived awake and talking although obviously uncomfortable.   Past Medical History:  Diagnosis Date  . Essential hypertension   . Hyperlipidemia   . Impaired fasting glucose   . Other fatigue   . Palpitations     Patient Active Problem List   Diagnosis Date Noted  . Cardiac arrest (HCC) 12/22/2017    Past Surgical History:  Procedure Laterality Date  . SHOULDER ACROMIOPLASTY  07/01/2017        Home Medications    Prior to Admission medications   Medication Sig Start Date End Date Taking? Authorizing Provider  aspirin EC 81 MG tablet Take 81 mg by mouth daily.    [provider]  escitalopram (LEXAPRO) 10 MG tablet Take 10 mg by mouth daily.    [provider]  losartan (COZAAR) 25 MG tablet Take 25 mg by mouth daily.    [provider]  ranitidine (ZANTAC) 75 MG tablet Take 75 mg by mouth daily.    [provider]    Family History Family History  Problem Relation Age of Onset  . Diabetes Mother   . Hypertension Mother   . Heart disease Mother   . Cancer Father   . Heart disease Brother     Social History Social History   Tobacco Use  . Smoking status: Former Games developer  . Smokeless tobacco: Never Used  Substance Use Topics  . Alcohol use: Yes    Comment: on occasion  . Drug use: No     Allergies   Patient has no known  allergies.   Review of Systems Review of Systems  All systems reviewed and negative, other than as noted in HPI.  Physical Exam Updated Vital Signs There were no vitals taken for this visit.  Physical Exam  Constitutional: He appears well-developed. He appears distressed.  Awake but obviously uncomfortable. Moaning at times.   HENT:  Head: Normocephalic and atraumatic.  Eyes: Conjunctivae are normal. Right eye exhibits no discharge. Left eye exhibits no discharge.  Neck: Neck supple.  Cardiovascular: Normal rate, regular rhythm and normal heart sounds. Exam reveals no gallop and no friction rub.  No murmur heard. Pulmonary/Chest: Effort normal and breath sounds normal. No respiratory distress.  Abdominal: Soft. He exhibits no distension. There is no tenderness.  Musculoskeletal: He exhibits no edema or tenderness.  Neurological: He is alert.  Skin: Skin is warm.  Nursing note and vitals reviewed.    ED Treatments / Results  Labs (all labs ordered are listed, but only abnormal results are displayed) Labs Reviewed  TROPONIN I - Abnormal; Notable for the following components:      Result Value   Troponin I 0.03 (*)    All other components within normal limits  HIV ANTIBODY (ROUTINE TESTING)  TROPONIN I  TROPONIN I  CBC  BASIC METABOLIC PANEL    EKG EKG Interpretation  Date/Time:  Thursday December 22 2017 09:45:51 EDT Ventricular Rate:  78 PR Interval:    QRS Duration: 121 QT Interval:  398 QTC Calculation: 454 R Axis:   86 Text Interpretation:  Sinus rhythm Nonspecific intraventricular conduction delay significant diffuse  ST depression  Confirmed by Raeford Razor [phone]) on 12/22/2017 10:23:11 AM   Radiology No results found.  Procedures Procedures (including critical care time)  CRITICAL CARE Performed by: Raeford Razor Total critical care time: 35 minutes Critical care time was exclusive of separately billable procedures and treating other  patients. Critical care was necessary to treat or prevent imminent or life-threatening deterioration. Critical care was time spent personally by me on the following activities: development of treatment plan with patient and/or surrogate as well as nursing, discussions with consultants, evaluation of patient's response to treatment, examination of patient, obtaining history from patient or surrogate, ordering and performing treatments and interventions, ordering and review of laboratory studies, ordering and review of radiographic studies, pulse oximetry and re-evaluation of patient's condition.   Medications Ordered in ED Medications  heparin injection 4,000 Units (has no administration in time range)  aspirin EC tablet 81 mg (has no administration in time range)  nitroGLYCERIN (NITROSTAT) SL tablet 0.4 mg (has no administration in time range)  acetaminophen (TYLENOL) tablet 650 mg (has no administration in time range)  ondansetron (ZOFRAN) injection 4 mg (has no administration in time range)  amiodarone (NEXTERONE) 1.8 mg/mL load via infusion 150 mg (has no administration in time range)    Followed by  amiodarone (NEXTERONE PREMIX) 360-4.14 MG/200ML-% (1.8 mg/mL) IV infusion (has no administration in time range)    Followed by  amiodarone (NEXTERONE PREMIX) 360-4.14 MG/200ML-% (1.8 mg/mL) IV infusion (has no administration in time range)  nitroGLYCERIN 50 mg in dextrose 5 % 250 mL (0.2 mg/mL) infusion (has no administration in time range)  amiodarone (NEXTERONE) 150-4.21 MG/100ML-% bolus (has no administration in time range)  fentaNYL (SUBLIMAZE) injection 100 mcg (has no administration in time range)  heparin ADULT infusion 100 units/mL (25000 units/274mL sodium chloride 0.45%) (has no administration in time range)     Initial Impression / Assessment and Plan / ED Course  I have reviewed the triage vital signs and the nursing notes.  Pertinent labs & imaging results that were available  during my care of the patient were reviewed by me and considered in my medical decision making (see chart for details).     61yM with witnessed VT arrest during stress testing. Unfortunate that this happened, but he pretty much had ideal care immediately initated by cardiology/staff. If this would have happened at home then he probably would have died.  He was cardioverted to sinus rhythm and wheeled to the ER. He arrived awake and talking.  Amiodarone, ASA, heparin, nitro. Cardiology at [facility] notified and to proceed immediately to cath lab. Local EMS to transport to expedite.  Final Clinical Impressions(s) / ED Diagnoses   Final diagnoses:  Acute myocardial infarction, unspecified MI type, unspecified artery (HCC)  Cardiac arrest (HCC)  VT (ventricular tachycardia) [facility])    ED Discharge Orders    None       Raeford Razor, MD 12/22/17 1031

## 2017-12-22 NOTE — Progress Notes (Signed)
TR BAND REMOVAL  LOCATION:    Radial rt radial  DEFLATED PER PROTOCOL:   yes  TIME BAND OFF / DRESSING APPLIED:    1500/gauze and tegaderm  SITE UPON ARRIVAL:    Level 0  SITE AFTER BAND REMOVAL:    Level 0  CIRCULATION SENSATION AND MOVEMENT:    Within Normal Limits :  Yes, rt hand and fingers warm and pink, sensation present, palpable rt radial  COMMENTS:

## 2017-12-22 NOTE — Progress Notes (Signed)
Pre-op Cardiac Surgery  Carotid Findings:  Findings suggest 40-59% right internal carotid artery stenosis and 1-39% left internal carotid artery stenosis. Vertebral arteries are patent with antegrade flow.  Upper Extremity Right Left  Brachial Pressures 111-Triphasic 113-Triphasic  Radial Waveforms Triphasic Triphasic  Ulnar Waveforms Triphasic Triphasic  Palmar Arch (Allen's Test) Within normal limits Signal obliterates with radial compression, signal is unaffected with ulnar compression.      Lower  Extremity Right Left  Dorsalis Pedis Triphasic Triphasic  Posterior Tibial Triphasic Triphasic    Findings:   Bilateral pedal waveforms are within normal limits at rest.  12/22/2017 5:49 PM Gertie Fey, MHA, RVT, RDCS, RDMS  Blanch Media

## 2017-12-23 ENCOUNTER — Inpatient Hospital Stay (HOSPITAL_COMMUNITY)
Admit: 2017-12-23 | Discharge: 2017-12-23 | Disposition: A | Discharge status: Home / Self Care | Payer: 59 | Attending: Cardiothoracic Surgery | Admitting: Cardiothoracic Surgery

## 2017-12-23 ENCOUNTER — Inpatient Hospital Stay (HOSPITAL_COMMUNITY): Payer: 59 | Admitting: Certified Registered"

## 2017-12-23 ENCOUNTER — Inpatient Hospital Stay (HOSPITAL_COMMUNITY)
Admission: EM | Disposition: A | Discharge status: Home / Self Care | Payer: Self-pay | Source: Home / Self Care | Attending: Cardiothoracic Surgery

## 2017-12-23 ENCOUNTER — Other Ambulatory Visit: Payer: Self-pay

## 2017-12-23 ENCOUNTER — Encounter (HOSPITAL_COMMUNITY): Payer: 59

## 2017-12-23 ENCOUNTER — Encounter (HOSPITAL_COMMUNITY): Payer: Self-pay | Admitting: Internal Medicine

## 2017-12-23 ENCOUNTER — Inpatient Hospital Stay (HOSPITAL_COMMUNITY): Payer: 59

## 2017-12-23 DIAGNOSIS — I200 Unstable angina: Secondary | ICD-10-CM

## 2017-12-23 DIAGNOSIS — Z951 Presence of aortocoronary bypass graft: Secondary | ICD-10-CM

## 2017-12-23 HISTORY — PX: CORONARY ARTERY BYPASS GRAFT: SHX141

## 2017-12-23 HISTORY — PX: TEE WITHOUT CARDIOVERSION: SHX5443

## 2017-12-23 LAB — CBC
HCT: 31.6 % — ABNORMAL LOW (ref 39.0–52.0)
HCT: 31.9 % — ABNORMAL LOW (ref 39.0–52.0)
HEMATOCRIT: 39.8 % (ref 39.0–52.0)
Hemoglobin: 13.2 g/dL (ref 13.0–17.0)
Hemoglobin: 10.6 g/dL — ABNORMAL LOW (ref 13.0–17.0)
Hemoglobin: 10.6 g/dL — ABNORMAL LOW (ref 13.0–17.0)
MCH: 29.9 pg (ref 26.0–34.0)
MCH: 30.5 pg (ref 26.0–34.0)
MCH: 30.4 pg (ref 26.0–34.0)
MCHC: 33.2 g/dL (ref 30.0–36.0)
MCHC: 33.5 g/dL (ref 30.0–36.0)
MCHC: 33.2 g/dL (ref 30.0–36.0)
MCV: 90 fL (ref 78.0–100.0)
MCV: 90.8 fL (ref 78.0–100.0)
MCV: 91.4 fL (ref 78.0–100.0)
PLATELETS: 94 10*3/uL — AB (ref 150–400)
Platelets: 146 10*3/uL — ABNORMAL LOW (ref 150–400)
Platelets: 125 10*3/uL — ABNORMAL LOW (ref 150–400)
RBC: 4.42 MIL/uL (ref 4.22–5.81)
RBC: 3.48 MIL/uL — ABNORMAL LOW (ref 4.22–5.81)
RBC: 3.49 MIL/uL — ABNORMAL LOW (ref 4.22–5.81)
RDW: 13.8 % (ref 11.5–15.5)
RDW: 14 % (ref 11.5–15.5)
RDW: 14 % (ref 11.5–15.5)
WBC: 6.1 10*3/uL (ref 4.0–10.5)
WBC: 10.4 10*3/uL (ref 4.0–10.5)
WBC: 12.2 10*3/uL — ABNORMAL HIGH (ref 4.0–10.5)

## 2017-12-23 LAB — POCT I-STAT, CHEM 8
BUN: 15 mg/dL (ref 8–23)
BUN: 13 mg/dL (ref 8–23)
BUN: 14 mg/dL (ref 8–23)
BUN: 14 mg/dL (ref 8–23)
BUN: 13 mg/dL (ref 8–23)
BUN: 13 mg/dL (ref 8–23)
BUN: 11 mg/dL (ref 8–23)
CALCIUM ION: 1 mmol/L — AB (ref 1.15–1.40)
CALCIUM ION: 1.11 mmol/L — AB (ref 1.15–1.40)
CALCIUM ION: 1.2 mmol/L (ref 1.15–1.40)
CALCIUM ION: 1.14 mmol/L — AB (ref 1.15–1.40)
CALCIUM ION: 1.05 mmol/L — AB (ref 1.15–1.40)
CHLORIDE: 106 mmol/L (ref 98–111)
CHLORIDE: 104 mmol/L (ref 98–111)
CHLORIDE: 106 mmol/L (ref 98–111)
CREATININE: 0.9 mg/dL (ref 0.61–1.24)
CREATININE: 0.8 mg/dL (ref 0.61–1.24)
CREATININE: 0.8 mg/dL (ref 0.61–1.24)
CREATININE: 0.7 mg/dL (ref 0.61–1.24)
Calcium, Ion: 1.26 mmol/L (ref 1.15–1.40)
Calcium, Ion: 1.23 mmol/L (ref 1.15–1.40)
Chloride: 107 mmol/L (ref 98–111)
Chloride: 104 mmol/L (ref 98–111)
Chloride: 106 mmol/L (ref 98–111)
Chloride: 108 mmol/L (ref 98–111)
Creatinine, Ser: 0.7 mg/dL (ref 0.61–1.24)
Creatinine, Ser: 0.8 mg/dL (ref 0.61–1.24)
Creatinine, Ser: 0.8 mg/dL (ref 0.61–1.24)
GLUCOSE: 102 mg/dL — AB (ref 70–99)
GLUCOSE: 86 mg/dL (ref 70–99)
GLUCOSE: 96 mg/dL (ref 70–99)
GLUCOSE: 118 mg/dL — AB (ref 70–99)
GLUCOSE: 126 mg/dL — AB (ref 70–99)
Glucose, Bld: 139 mg/dL — ABNORMAL HIGH (ref 70–99)
Glucose, Bld: 156 mg/dL — ABNORMAL HIGH (ref 70–99)
HCT: 25 % — ABNORMAL LOW (ref 39.0–52.0)
HCT: 30 % — ABNORMAL LOW (ref 39.0–52.0)
HCT: 26 % — ABNORMAL LOW (ref 39.0–52.0)
HCT: 25 % — ABNORMAL LOW (ref 39.0–52.0)
HCT: 26 % — ABNORMAL LOW (ref 39.0–52.0)
HCT: 26 % — ABNORMAL LOW (ref 39.0–52.0)
HEMATOCRIT: 34 % — AB (ref 39.0–52.0)
HEMOGLOBIN: 11.6 g/dL — AB (ref 13.0–17.0)
HEMOGLOBIN: 8.8 g/dL — AB (ref 13.0–17.0)
Hemoglobin: 10.2 g/dL — ABNORMAL LOW (ref 13.0–17.0)
Hemoglobin: 8.5 g/dL — ABNORMAL LOW (ref 13.0–17.0)
Hemoglobin: 8.8 g/dL — ABNORMAL LOW (ref 13.0–17.0)
Hemoglobin: 8.5 g/dL — ABNORMAL LOW (ref 13.0–17.0)
Hemoglobin: 8.8 g/dL — ABNORMAL LOW (ref 13.0–17.0)
POTASSIUM: 4 mmol/L (ref 3.5–5.1)
POTASSIUM: 4.2 mmol/L (ref 3.5–5.1)
POTASSIUM: 4.4 mmol/L (ref 3.5–5.1)
Potassium: 3.9 mmol/L (ref 3.5–5.1)
Potassium: 4.6 mmol/L (ref 3.5–5.1)
Potassium: 4.5 mmol/L (ref 3.5–5.1)
Potassium: 3.7 mmol/L (ref 3.5–5.1)
Sodium: 138 mmol/L (ref 135–145)
Sodium: 138 mmol/L (ref 135–145)
Sodium: 139 mmol/L (ref 135–145)
Sodium: 138 mmol/L (ref 135–145)
Sodium: 135 mmol/L (ref 135–145)
Sodium: 138 mmol/L (ref 135–145)
Sodium: 140 mmol/L (ref 135–145)
TCO2: 25 mmol/L (ref 22–32)
TCO2: 25 mmol/L (ref 22–32)
TCO2: 26 mmol/L (ref 22–32)
TCO2: 25 mmol/L (ref 22–32)
TCO2: 24 mmol/L (ref 22–32)
TCO2: 22 mmol/L (ref 22–32)
TCO2: 20 mmol/L — AB (ref 22–32)

## 2017-12-23 LAB — POCT I-STAT 3, ART BLOOD GAS (G3+)
Acid-base deficit: 1 mmol/L (ref 0.0–2.0)
Acid-base deficit: 2 mmol/L (ref 0.0–2.0)
Acid-base deficit: 4 mmol/L — ABNORMAL HIGH (ref 0.0–2.0)
BICARBONATE: 23.7 mmol/L (ref 20.0–28.0)
BICARBONATE: 22 mmol/L (ref 20.0–28.0)
Bicarbonate: 22.9 mmol/L (ref 20.0–28.0)
O2 SAT: 100 %
O2 SAT: 100 %
O2 Saturation: 99 %
PCO2 ART: 39.5 mmHg (ref 32.0–48.0)
PH ART: 7.385 (ref 7.350–7.450)
PH ART: 7.335 — AB (ref 7.350–7.450)
PO2 ART: 253 mmHg — AB (ref 83.0–108.0)
TCO2: 25 mmol/L (ref 22–32)
TCO2: 24 mmol/L (ref 22–32)
TCO2: 23 mmol/L (ref 22–32)
pCO2 arterial: 39.3 mmHg (ref 32.0–48.0)
pCO2 arterial: 40.8 mmHg (ref 32.0–48.0)
pH, Arterial: 7.374 (ref 7.350–7.450)
pO2, Arterial: 406 mmHg — ABNORMAL HIGH (ref 83.0–108.0)
pO2, Arterial: 157 mmHg — ABNORMAL HIGH (ref 83.0–108.0)

## 2017-12-23 LAB — LIPID PANEL
CHOL/HDL RATIO: 4.1 ratio
CHOLESTEROL: 196 mg/dL (ref 0–200)
HDL: 48 mg/dL (ref >40)
LDL Cholesterol: 136 mg/dL — ABNORMAL HIGH (ref 0–99)
Triglycerides: 60 mg/dL (ref <150)
VLDL: 12 mg/dL (ref 0–40)

## 2017-12-23 LAB — PREPARE RBC (CROSSMATCH)

## 2017-12-23 LAB — COMPREHENSIVE METABOLIC PANEL
ALT: 24 U/L (ref 0–44)
ANION GAP: 9 (ref 5–15)
AST: 24 U/L (ref 15–41)
Albumin: 3.9 g/dL (ref 3.5–5.0)
Alkaline Phosphatase: 48 U/L (ref 38–126)
BILIRUBIN TOTAL: 0.6 mg/dL (ref 0.3–1.2)
BUN: 15 mg/dL (ref 8–23)
CALCIUM: 9 mg/dL (ref 8.9–10.3)
CO2: 25 mmol/L (ref 22–32)
Chloride: 106 mmol/L (ref 98–111)
Creatinine, Ser: 1.05 mg/dL (ref 0.61–1.24)
Glucose, Bld: 105 mg/dL — ABNORMAL HIGH (ref 70–99)
POTASSIUM: 3.7 mmol/L (ref 3.5–5.1)
Sodium: 140 mmol/L (ref 135–145)
TOTAL PROTEIN: 5.9 g/dL — AB (ref 6.5–8.1)

## 2017-12-23 LAB — HEMOGLOBIN A1C
Hgb A1c MFr Bld: 5.6 % (ref 4.8–5.6)
Mean Plasma Glucose: 114.02 mg/dL

## 2017-12-23 LAB — GLUCOSE, CAPILLARY
GLUCOSE-CAPILLARY: 136 mg/dL — AB (ref 70–99)
GLUCOSE-CAPILLARY: 128 mg/dL — AB (ref 70–99)
Glucose-Capillary: 117 mg/dL — ABNORMAL HIGH (ref 70–99)
Glucose-Capillary: 131 mg/dL — ABNORMAL HIGH (ref 70–99)
Glucose-Capillary: 131 mg/dL — ABNORMAL HIGH (ref 70–99)
Glucose-Capillary: 140 mg/dL — ABNORMAL HIGH (ref 70–99)
Glucose-Capillary: 144 mg/dL — ABNORMAL HIGH (ref 70–99)
Glucose-Capillary: 143 mg/dL — ABNORMAL HIGH (ref 70–99)
Glucose-Capillary: 111 mg/dL — ABNORMAL HIGH (ref 70–99)

## 2017-12-23 LAB — PROTIME-INR
INR: 1.67
PROTHROMBIN TIME: 19.6 s — AB (ref 11.4–15.2)

## 2017-12-23 LAB — CREATININE, SERUM
Creatinine, Ser: 0.96 mg/dL (ref 0.61–1.24)
GFR calc Af Amer: >60 mL/min (ref >60)
GFR calc non Af Amer: >60 mL/min (ref >60)

## 2017-12-23 LAB — ECHOCARDIOGRAM COMPLETE
Height: 69.0157 in
Weight: 2784.0042 oz

## 2017-12-23 LAB — POCT I-STAT 4, (NA,K, GLUC, HGB,HCT)
GLUCOSE: 120 mg/dL — AB (ref 70–99)
HCT: 29 % — ABNORMAL LOW (ref 39.0–52.0)
Hemoglobin: 9.9 g/dL — ABNORMAL LOW (ref 13.0–17.0)
POTASSIUM: 3.8 mmol/L (ref 3.5–5.1)
Sodium: 141 mmol/L (ref 135–145)

## 2017-12-23 LAB — PLATELET COUNT: Platelets: 126 10*3/uL — ABNORMAL LOW (ref 150–400)

## 2017-12-23 LAB — APTT: APTT: 34 s (ref 24–36)

## 2017-12-23 LAB — MAGNESIUM: Magnesium: 2.6 mg/dL — ABNORMAL HIGH (ref 1.7–2.4)

## 2017-12-23 LAB — HEMOGLOBIN AND HEMATOCRIT, BLOOD
HCT: 28.3 % — ABNORMAL LOW (ref 39.0–52.0)
Hemoglobin: 9.4 g/dL — ABNORMAL LOW (ref 13.0–17.0)

## 2017-12-23 LAB — HIV ANTIBODY (ROUTINE TESTING W REFLEX): HIV Screen 4th Generation wRfx: NONREACTIVE

## 2017-12-23 LAB — HEPARIN LEVEL (UNFRACTIONATED): HEPARIN UNFRACTIONATED: 0.18 [IU]/mL — AB (ref 0.30–0.70)

## 2017-12-23 LAB — ABO/RH: ABO/RH(D): O NEG

## 2017-12-23 SURGERY — CORONARY ARTERY BYPASS GRAFTING (CABG)
Anesthesia: General | Site: Chest

## 2017-12-23 MED ORDER — LACTATED RINGERS IV SOLN: INTRAVENOUS | Status: DC

## 2017-12-23 MED ORDER — INSULIN REGULAR BOLUS VIA INFUSION
0.0000 [IU] | Freq: Three times a day (TID) | INTRAVENOUS | Status: DC
  Filled 2017-12-23: qty 10

## 2017-12-23 MED ORDER — VANCOMYCIN HCL IN DEXTROSE 1-5 GM/200ML-% IV SOLN
1000.0000 mg | Freq: Once | INTRAVENOUS | Status: AC
  Administered 2017-12-23: 1000 mg via INTRAVENOUS
  Filled 2017-12-23: qty 200

## 2017-12-23 MED ORDER — HEPARIN (PORCINE) IN NACL 100-0.45 UNIT/ML-% IJ SOLN: 1250.0000 [IU]/h | INTRAMUSCULAR | Status: DC

## 2017-12-23 MED ORDER — SODIUM CHLORIDE 0.9% IV SOLUTION: Freq: Once | INTRAVENOUS | Status: DC

## 2017-12-23 MED ORDER — PLASMA-LYTE 148 IV SOLN
INTRAVENOUS | Status: DC
  Filled 2017-12-23: qty 2.5

## 2017-12-23 MED ORDER — ONDANSETRON HCL 4 MG/2ML IJ SOLN
4.0000 mg | Freq: Four times a day (QID) | INTRAMUSCULAR | Status: DC | PRN
  Administered 2017-12-24 – 2017-12-25 (×2): 4 mg via INTRAVENOUS
  Filled 2017-12-23 (×2): qty 2

## 2017-12-23 MED ORDER — FENTANYL CITRATE (PF) 250 MCG/5ML IJ SOLN
INTRAMUSCULAR | Status: DC | PRN
  Administered 2017-12-23 (×2): 250 ug via INTRAVENOUS
  Administered 2017-12-23: 700 ug via INTRAVENOUS
  Administered 2017-12-23: 50 ug via INTRAVENOUS
  Administered 2017-12-23: 250 ug via INTRAVENOUS

## 2017-12-23 MED ORDER — ASPIRIN 81 MG PO CHEW
324.0000 mg | CHEWABLE_TABLET | Freq: Every day | ORAL | Status: DC
  Administered 2017-12-24: 324 mg
  Filled 2017-12-23: qty 4

## 2017-12-23 MED ORDER — PROPOFOL 10 MG/ML IV BOLUS
INTRAVENOUS | Status: AC
  Filled 2017-12-23: qty 20

## 2017-12-23 MED ORDER — ACETAMINOPHEN 160 MG/5ML PO SOLN: 1000.0000 mg | Freq: Four times a day (QID) | ORAL | Status: DC

## 2017-12-23 MED ORDER — ROCURONIUM BROMIDE 10 MG/ML (PF) SYRINGE
PREFILLED_SYRINGE | INTRAVENOUS | Status: DC | PRN
  Administered 2017-12-23: 35 mg via INTRAVENOUS
  Administered 2017-12-23: 30 mg via INTRAVENOUS
  Administered 2017-12-23: 50 mg via INTRAVENOUS
  Administered 2017-12-23: 15 mg via INTRAVENOUS
  Administered 2017-12-23 (×2): 50 mg via INTRAVENOUS

## 2017-12-23 MED ORDER — FENTANYL CITRATE (PF) 250 MCG/5ML IJ SOLN
INTRAMUSCULAR | Status: AC
  Filled 2017-12-23: qty 30

## 2017-12-23 MED ORDER — OXYCODONE HCL 5 MG PO TABS
5.0000 mg | ORAL_TABLET | ORAL | Status: DC | PRN
  Administered 2017-12-23 – 2017-12-24 (×2): 5 mg via ORAL
  Administered 2017-12-25: 10 mg via ORAL
  Administered 2017-12-25 – 2017-12-26 (×2): 5 mg via ORAL
  Filled 2017-12-23: qty 1
  Filled 2017-12-23: qty 2
  Filled 2017-12-23 (×3): qty 1

## 2017-12-23 MED ORDER — BISACODYL 5 MG PO TBEC
10.0000 mg | DELAYED_RELEASE_TABLET | Freq: Every day | ORAL | Status: DC
  Administered 2017-12-24 – 2017-12-28 (×4): 10 mg via ORAL
  Filled 2017-12-23 (×4): qty 2

## 2017-12-23 MED ORDER — POTASSIUM CHLORIDE 10 MEQ/50ML IV SOLN
10.0000 meq | INTRAVENOUS | Status: AC
  Administered 2017-12-23 (×3): 10 meq via INTRAVENOUS
  Filled 2017-12-23 (×3): qty 50

## 2017-12-23 MED ORDER — NOREPINEPHRINE 4 MG/250ML-% IV SOLN
0.0000 ug/min | INTRAVENOUS | Status: DC
  Administered 2017-12-24: 3 ug/min via INTRAVENOUS
  Filled 2017-12-23: qty 250

## 2017-12-23 MED ORDER — NOREPINEPHRINE 4 MG/250ML-% IV SOLN
0.0000 ug/min | INTRAVENOUS | Status: DC
  Administered 2017-12-23: 3 ug/min via INTRAVENOUS
  Filled 2017-12-23: qty 250

## 2017-12-23 MED ORDER — ROCURONIUM BROMIDE 50 MG/5ML IV SOSY
PREFILLED_SYRINGE | INTRAVENOUS | Status: AC
  Filled 2017-12-23: qty 5

## 2017-12-23 MED ORDER — ALBUMIN HUMAN 5 % IV SOLN
INTRAVENOUS | Status: DC | PRN
  Administered 2017-12-23: 14:00:00 via INTRAVENOUS

## 2017-12-23 MED ORDER — HEPARIN SODIUM (PORCINE) 1000 UNIT/ML IJ SOLN
INTRAMUSCULAR | Status: DC | PRN
  Administered 2017-12-23: 26000 [IU] via INTRAVENOUS
  Administered 2017-12-23: 2000 [IU] via INTRAVENOUS

## 2017-12-23 MED ORDER — LACTATED RINGERS IV SOLN: 500.0000 mL | Freq: Once | INTRAVENOUS | Status: DC | PRN

## 2017-12-23 MED ORDER — ACETAMINOPHEN 650 MG RE SUPP
650.0000 mg | Freq: Once | RECTAL | Status: AC
  Administered 2017-12-23: 650 mg via RECTAL

## 2017-12-23 MED ORDER — LACTATED RINGERS IV SOLN
INTRAVENOUS | Status: DC | PRN
  Administered 2017-12-23 (×4): via INTRAVENOUS

## 2017-12-23 MED ORDER — MILRINONE LACTATE IN DEXTROSE 20-5 MG/100ML-% IV SOLN
0.1250 ug/kg/min | INTRAVENOUS | Status: DC
  Administered 2017-12-23 – 2017-12-24 (×2): 0.25 ug/kg/min via INTRAVENOUS
  Administered 2017-12-25: 0.125 ug/kg/min via INTRAVENOUS
  Filled 2017-12-23 (×3): qty 100

## 2017-12-23 MED ORDER — ARTIFICIAL TEARS OPHTHALMIC OINT
TOPICAL_OINTMENT | OPHTHALMIC | Status: AC
  Filled 2017-12-23: qty 3.5

## 2017-12-23 MED ORDER — SUCCINYLCHOLINE CHLORIDE 200 MG/10ML IV SOSY
PREFILLED_SYRINGE | INTRAVENOUS | Status: AC
  Filled 2017-12-23: qty 10

## 2017-12-23 MED ORDER — SODIUM CHLORIDE 0.9 % IV SOLN
INTRAVENOUS | Status: DC | PRN
  Administered 2017-12-23: 13:00:00 via INTRAVENOUS

## 2017-12-23 MED ORDER — SODIUM CHLORIDE 0.9 % IV SOLN
1.5000 g | Freq: Two times a day (BID) | INTRAVENOUS | Status: AC
  Administered 2017-12-23 – 2017-12-25 (×4): 1.5 g via INTRAVENOUS
  Filled 2017-12-23 (×4): qty 1.5

## 2017-12-23 MED ORDER — HEPARIN SODIUM (PORCINE) 1000 UNIT/ML IJ SOLN
INTRAMUSCULAR | Status: AC
  Filled 2017-12-23: qty 1

## 2017-12-23 MED ORDER — PHENYLEPHRINE 40 MCG/ML (10ML) SYRINGE FOR IV PUSH (FOR BLOOD PRESSURE SUPPORT)
PREFILLED_SYRINGE | INTRAVENOUS | Status: AC
  Filled 2017-12-23: qty 10

## 2017-12-23 MED ORDER — SODIUM CHLORIDE 0.9% FLUSH: 3.0000 mL | INTRAVENOUS | Status: DC | PRN

## 2017-12-23 MED ORDER — SODIUM CHLORIDE 0.9 % IV SOLN
INTRAVENOUS | Status: DC
  Filled 2017-12-23 (×3): qty 1

## 2017-12-23 MED ORDER — DEXMEDETOMIDINE HCL IN NACL 200 MCG/50ML IV SOLN
0.0000 ug/kg/h | INTRAVENOUS | Status: DC
  Administered 2017-12-23: 0.2 ug/kg/h via INTRAVENOUS
  Filled 2017-12-23: qty 50

## 2017-12-23 MED ORDER — EPINEPHRINE PF 1 MG/ML IJ SOLN
0.0000 ug/min | INTRAVENOUS | Status: DC
  Filled 2017-12-23: qty 4

## 2017-12-23 MED ORDER — HEMOSTATIC AGENTS (NO CHARGE) OPTIME
TOPICAL | Status: DC | PRN
  Administered 2017-12-23 (×2): 1 via TOPICAL

## 2017-12-23 MED ORDER — MIDAZOLAM HCL 10 MG/2ML IJ SOLN
INTRAMUSCULAR | Status: AC
  Filled 2017-12-23: qty 2

## 2017-12-23 MED ORDER — MIDAZOLAM HCL 2 MG/2ML IJ SOLN: 2.0000 mg | INTRAMUSCULAR | Status: DC | PRN

## 2017-12-23 MED ORDER — SODIUM CHLORIDE 0.9% FLUSH: 10.0000 mL | INTRAVENOUS | Status: DC | PRN

## 2017-12-23 MED ORDER — LIDOCAINE 2% (20 MG/ML) 5 ML SYRINGE
INTRAMUSCULAR | Status: AC
  Filled 2017-12-23: qty 5

## 2017-12-23 MED ORDER — CHLORHEXIDINE GLUCONATE 0.12 % MT SOLN
15.0000 mL | OROMUCOSAL | Status: AC
  Administered 2017-12-23: 15 mL via OROMUCOSAL

## 2017-12-23 MED ORDER — NITROGLYCERIN IN D5W 200-5 MCG/ML-% IV SOLN: 0.0000 ug/min | INTRAVENOUS | Status: DC

## 2017-12-23 MED ORDER — SODIUM CHLORIDE 0.9 % IV SOLN
INTRAVENOUS | Status: DC | PRN
  Administered 2017-12-23: 750 mg via INTRAVENOUS

## 2017-12-23 MED ORDER — THROMBIN 20000 UNITS EX SOLR
OROMUCOSAL | Status: DC | PRN
  Administered 2017-12-23 (×3): 4 mL via TOPICAL

## 2017-12-23 MED ORDER — SODIUM CHLORIDE 0.9 % IV SOLN
INTRAVENOUS | Status: DC
  Administered 2017-12-23: 15:00:00 via INTRAVENOUS

## 2017-12-23 MED ORDER — SODIUM BICARBONATE 8.4 % IV SOLN
50.0000 meq | Freq: Once | INTRAVENOUS | Status: AC
  Administered 2017-12-23: 50 meq via INTRAVENOUS

## 2017-12-23 MED ORDER — MAGNESIUM SULFATE 4 GM/100ML IV SOLN
4.0000 g | Freq: Once | INTRAVENOUS | Status: AC
  Administered 2017-12-23: 4 g via INTRAVENOUS
  Filled 2017-12-23: qty 100

## 2017-12-23 MED ORDER — ROCURONIUM BROMIDE 50 MG/5ML IV SOSY
PREFILLED_SYRINGE | INTRAVENOUS | Status: AC
  Filled 2017-12-23: qty 15

## 2017-12-23 MED ORDER — ASPIRIN EC 325 MG PO TBEC
325.0000 mg | DELAYED_RELEASE_TABLET | Freq: Every day | ORAL | Status: DC
  Administered 2017-12-25 – 2017-12-28 (×4): 325 mg via ORAL
  Filled 2017-12-23 (×4): qty 1

## 2017-12-23 MED ORDER — SODIUM CHLORIDE 0.9 % IV SOLN
250.0000 mL | INTRAVENOUS | Status: DC
  Administered 2017-12-24: 250 mL via INTRAVENOUS

## 2017-12-23 MED ORDER — DOCUSATE SODIUM 100 MG PO CAPS
200.0000 mg | ORAL_CAPSULE | Freq: Every day | ORAL | Status: DC
  Administered 2017-12-24 – 2017-12-28 (×4): 200 mg via ORAL
  Filled 2017-12-23 (×4): qty 2

## 2017-12-23 MED ORDER — CHLORHEXIDINE GLUCONATE CLOTH 2 % EX PADS
6.0000 | MEDICATED_PAD | Freq: Every day | CUTANEOUS | Status: DC
  Administered 2017-12-23 – 2017-12-25 (×3): 6 via TOPICAL

## 2017-12-23 MED ORDER — PROTAMINE SULFATE 10 MG/ML IV SOLN
INTRAVENOUS | Status: DC | PRN
  Administered 2017-12-23: 280 mg via INTRAVENOUS

## 2017-12-23 MED ORDER — 0.9 % SODIUM CHLORIDE (POUR BTL) OPTIME
TOPICAL | Status: DC | PRN
  Administered 2017-12-23: 6000 mL

## 2017-12-23 MED ORDER — PROTAMINE SULFATE 10 MG/ML IV SOLN
INTRAVENOUS | Status: AC
  Filled 2017-12-23: qty 25

## 2017-12-23 MED ORDER — PANTOPRAZOLE SODIUM 40 MG PO TBEC
40.0000 mg | DELAYED_RELEASE_TABLET | Freq: Every day | ORAL | Status: DC
  Administered 2017-12-25 – 2017-12-28 (×4): 40 mg via ORAL
  Filled 2017-12-23 (×4): qty 1

## 2017-12-23 MED ORDER — MORPHINE SULFATE (PF) 2 MG/ML IV SOLN
2.0000 mg | INTRAVENOUS | Status: DC | PRN
  Administered 2017-12-24 (×2): 2 mg via INTRAVENOUS
  Filled 2017-12-23 (×3): qty 1

## 2017-12-23 MED ORDER — GLYCOPYRROLATE PF 0.2 MG/ML IJ SOSY
PREFILLED_SYRINGE | INTRAMUSCULAR | Status: DC | PRN
  Administered 2017-12-23: .2 mg via INTRAVENOUS

## 2017-12-23 MED ORDER — SODIUM CHLORIDE 0.9% FLUSH
3.0000 mL | Freq: Two times a day (BID) | INTRAVENOUS | Status: DC
  Administered 2017-12-24 – 2017-12-27 (×6): 3 mL via INTRAVENOUS

## 2017-12-23 MED ORDER — METOPROLOL TARTRATE 12.5 MG HALF TABLET
12.5000 mg | ORAL_TABLET | Freq: Two times a day (BID) | ORAL | Status: DC
  Administered 2017-12-25 – 2017-12-28 (×7): 12.5 mg via ORAL
  Filled 2017-12-23 (×8): qty 1

## 2017-12-23 MED ORDER — ALBUMIN HUMAN 5 % IV SOLN
250.0000 mL | INTRAVENOUS | Status: AC | PRN
  Administered 2017-12-23 (×3): 12.5 g via INTRAVENOUS
  Filled 2017-12-23 (×3): qty 250

## 2017-12-23 MED ORDER — FAMOTIDINE IN NACL 20-0.9 MG/50ML-% IV SOLN
20.0000 mg | Freq: Two times a day (BID) | INTRAVENOUS | Status: DC
  Administered 2017-12-23: 20 mg via INTRAVENOUS

## 2017-12-23 MED ORDER — ACETAMINOPHEN 160 MG/5ML PO SOLN: 650.0000 mg | Freq: Once | ORAL | Status: AC

## 2017-12-23 MED ORDER — PLASMA-LYTE 148 IV SOLN
INTRAVENOUS | Status: DC | PRN
  Administered 2017-12-23: 500 mL via INTRAVASCULAR

## 2017-12-23 MED ORDER — POTASSIUM CHLORIDE 10 MEQ/50ML IV SOLN
10.0000 meq | INTRAVENOUS | Status: AC
  Administered 2017-12-23 (×3): 10 meq via INTRAVENOUS

## 2017-12-23 MED ORDER — METOPROLOL TARTRATE 5 MG/5ML IV SOLN: 2.5000 mg | INTRAVENOUS | Status: DC | PRN

## 2017-12-23 MED ORDER — MIDAZOLAM HCL 5 MG/5ML IJ SOLN
INTRAMUSCULAR | Status: DC | PRN
  Administered 2017-12-23: 2 mg via INTRAVENOUS
  Administered 2017-12-23: 3 mg via INTRAVENOUS
  Administered 2017-12-23: 2 mg via INTRAVENOUS
  Administered 2017-12-23: 3 mg via INTRAVENOUS

## 2017-12-23 MED ORDER — METOPROLOL TARTRATE 25 MG/10 ML ORAL SUSPENSION: 12.5000 mg | Freq: Two times a day (BID) | ORAL | Status: DC

## 2017-12-23 MED ORDER — PHENYLEPHRINE HCL-NACL 20-0.9 MG/250ML-% IV SOLN
0.0000 ug/min | INTRAVENOUS | Status: DC
  Filled 2017-12-23: qty 250

## 2017-12-23 MED ORDER — PROPOFOL 10 MG/ML IV BOLUS
INTRAVENOUS | Status: DC | PRN
  Administered 2017-12-23: 80 mg via INTRAVENOUS

## 2017-12-23 MED ORDER — EPHEDRINE 5 MG/ML INJ
INTRAVENOUS | Status: AC
  Filled 2017-12-23: qty 10

## 2017-12-23 MED ORDER — ACETAMINOPHEN 500 MG PO TABS
1000.0000 mg | ORAL_TABLET | Freq: Four times a day (QID) | ORAL | Status: DC
  Administered 2017-12-23 – 2017-12-28 (×15): 1000 mg via ORAL
  Filled 2017-12-23 (×15): qty 2

## 2017-12-23 MED ORDER — SODIUM CHLORIDE 0.45 % IV SOLN
INTRAVENOUS | Status: DC | PRN
  Administered 2017-12-23: 15:00:00 via INTRAVENOUS

## 2017-12-23 MED ORDER — TRAMADOL HCL 50 MG PO TABS
50.0000 mg | ORAL_TABLET | ORAL | Status: DC | PRN
  Administered 2017-12-24 – 2017-12-27 (×5): 100 mg via ORAL
  Filled 2017-12-23 (×5): qty 2

## 2017-12-23 MED ORDER — BISACODYL 10 MG RE SUPP: 10.0000 mg | Freq: Every day | RECTAL | Status: DC

## 2017-12-23 MED ORDER — MORPHINE SULFATE (PF) 2 MG/ML IV SOLN
1.0000 mg | INTRAVENOUS | Status: DC | PRN
  Administered 2017-12-23 (×2): 1 mg via INTRAVENOUS

## 2017-12-23 MED ORDER — SODIUM CHLORIDE 0.9% FLUSH
10.0000 mL | Freq: Two times a day (BID) | INTRAVENOUS | Status: DC
  Administered 2017-12-23: 20 mL
  Administered 2017-12-24: 10 mL

## 2017-12-23 MED ORDER — ORAL CARE MOUTH RINSE
15.0000 mL | Freq: Two times a day (BID) | OROMUCOSAL | Status: DC
  Administered 2017-12-24 – 2017-12-25 (×5): 15 mL via OROMUCOSAL

## 2017-12-23 SURGICAL SUPPLY — 107 items
ADAPTER CARDIO PERF ANTE/RETRO (ADAPTER) ×4 IMPLANT
ADPR PRFSN 84XANTGRD RTRGD (ADAPTER) ×2
BAG DECANTER FOR FLEXI CONT (MISCELLANEOUS) ×4 IMPLANT
BANDAGE ACE 4X5 VEL STRL LF (GAUZE/BANDAGES/DRESSINGS) ×4 IMPLANT
BANDAGE ACE 6X5 VEL STRL LF (GAUZE/BANDAGES/DRESSINGS) ×4 IMPLANT
BASKET HEART  (ORDER IN 25'S) (MISCELLANEOUS) ×1
BASKET HEART (ORDER IN 25'S) (MISCELLANEOUS) ×1
BASKET HEART (ORDER IN 25S) (MISCELLANEOUS) ×2 IMPLANT
BLADE CLIPPER SURG (BLADE) IMPLANT
BLADE MINI RND TIP GREEN BEAV (BLADE) ×2 IMPLANT
BLADE NDL 3 SS STRL (BLADE) IMPLANT
BLADE NEEDLE 3 SS STRL (BLADE) ×3 IMPLANT
BLADE NEEDLE 3MM SS STRL (BLADE) ×1
BLADE STERNUM SYSTEM 6 (BLADE) ×4 IMPLANT
BLADE SURG 12 STRL SS (BLADE) ×4 IMPLANT
BNDG GAUZE ELAST 4 BULKY (GAUZE/BANDAGES/DRESSINGS) ×4 IMPLANT
CANISTER SUCT 3000ML PPV (MISCELLANEOUS) ×4 IMPLANT
CANNULA GUNDRY RCSP 15FR (MISCELLANEOUS) ×4 IMPLANT
CATH CPB KIT VANTRIGT (MISCELLANEOUS) ×4 IMPLANT
CATH ROBINSON RED A/P 18FR (CATHETERS) ×12 IMPLANT
CATH THORACIC 36FR RT ANG (CATHETERS) ×4 IMPLANT
CLIP FOGARTY SPRING 6M (CLIP) ×2 IMPLANT
CLIP VESOCCLUDE MED 24/CT (CLIP) ×2 IMPLANT
CLIP VESOCCLUDE SM WIDE 24/CT (CLIP) ×4 IMPLANT
CRADLE DONUT ADULT HEAD (MISCELLANEOUS) ×4 IMPLANT
DRAIN CHANNEL 32F RND 10.7 FF (WOUND CARE) ×4 IMPLANT
DRAPE CARDIOVASCULAR INCISE (DRAPES) ×4
DRAPE SLUSH/WARMER DISC (DRAPES) ×4 IMPLANT
DRAPE SRG 135X102X78XABS (DRAPES) ×2 IMPLANT
DRSG AQUACEL AG ADV 3.5X14 (GAUZE/BANDAGES/DRESSINGS) ×4 IMPLANT
DRSG TEGADERM 2-3/8X2-3/4 SM (GAUZE/BANDAGES/DRESSINGS) ×2 IMPLANT
ELECT BLADE 4.0 EZ CLEAN MEGAD (MISCELLANEOUS) ×4
ELECT BLADE 6.5 EXT (BLADE) ×4 IMPLANT
ELECT CAUTERY BLADE 6.4 (BLADE) ×4 IMPLANT
ELECT REM PT RETURN 9FT ADLT (ELECTROSURGICAL) ×8
ELECTRODE BLDE 4.0 EZ CLN MEGD (MISCELLANEOUS) ×2 IMPLANT
ELECTRODE REM PT RTRN 9FT ADLT (ELECTROSURGICAL) ×4 IMPLANT
FELT TEFLON 1X6 (MISCELLANEOUS) ×8 IMPLANT
GAUZE SPONGE 4X4 12PLY STRL (GAUZE/BANDAGES/DRESSINGS) ×8 IMPLANT
GLOVE BIO SURGEON STRL SZ 6 (GLOVE) ×8 IMPLANT
GLOVE BIO SURGEON STRL SZ 6.5 (GLOVE) ×6 IMPLANT
GLOVE BIO SURGEON STRL SZ7.5 (GLOVE) ×18 IMPLANT
GLOVE BIO SURGEONS STRL SZ 6.5 (GLOVE) ×6
GLOVE BIOGEL PI IND STRL 6 (GLOVE) IMPLANT
GLOVE BIOGEL PI IND STRL 6.5 (GLOVE) IMPLANT
GLOVE BIOGEL PI INDICATOR 6 (GLOVE) ×2
GLOVE BIOGEL PI INDICATOR 6.5 (GLOVE) ×2
GOWN STRL REUS W/ TWL LRG LVL3 (GOWN DISPOSABLE) ×8 IMPLANT
GOWN STRL REUS W/TWL LRG LVL3 (GOWN DISPOSABLE) ×32
HEMOSTAT POWDER SURGIFOAM 1G (HEMOSTASIS) ×12 IMPLANT
HEMOSTAT SURGICEL 2X14 (HEMOSTASIS) ×4 IMPLANT
INSERT FOGARTY XLG (MISCELLANEOUS) IMPLANT
KIT BASIN OR (CUSTOM PROCEDURE TRAY) ×4 IMPLANT
KIT SUCTION CATH 14FR (SUCTIONS) ×4 IMPLANT
KIT TURNOVER KIT B (KITS) ×4 IMPLANT
KIT VASOVIEW HEMOPRO VH 3000 (KITS) ×4 IMPLANT
LEAD PACING MYOCARDI (MISCELLANEOUS) ×4 IMPLANT
LINE VENT (MISCELLANEOUS) ×2 IMPLANT
MARKER GRAFT CORONARY BYPASS (MISCELLANEOUS) ×12 IMPLANT
NS IRRIG 1000ML POUR BTL (IV SOLUTION) ×22 IMPLANT
PACK E OPEN HEART (SUTURE) ×4 IMPLANT
PACK OPEN HEART (CUSTOM PROCEDURE TRAY) ×4 IMPLANT
PAD ARMBOARD 7.5X6 YLW CONV (MISCELLANEOUS) ×8 IMPLANT
PAD ELECT DEFIB RADIOL ZOLL (MISCELLANEOUS) ×4 IMPLANT
PENCIL BUTTON HOLSTER BLD 10FT (ELECTRODE) ×4 IMPLANT
PUNCH AORTIC ROTATE  4.5MM 8IN (MISCELLANEOUS) ×2 IMPLANT
PUNCH AORTIC ROTATE 4.0MM (MISCELLANEOUS) IMPLANT
PUNCH AORTIC ROTATE 4.5MM 8IN (MISCELLANEOUS) IMPLANT
PUNCH AORTIC ROTATE 5MM 8IN (MISCELLANEOUS) IMPLANT
SENSOR MYOCARDIAL TEMP (MISCELLANEOUS) ×2 IMPLANT
SET CARDIOPLEGIA MPS 5001102 (MISCELLANEOUS) ×2 IMPLANT
SPONGE LAP 4X18 RFD (DISPOSABLE) ×2 IMPLANT
SURGIFLO W/THROMBIN 8M KIT (HEMOSTASIS) ×4 IMPLANT
SUT BONE WAX W31G (SUTURE) ×4 IMPLANT
SUT MNCRL AB 4-0 PS2 18 (SUTURE) IMPLANT
SUT PROLENE 3 0 SH DA (SUTURE) IMPLANT
SUT PROLENE 3 0 SH1 36 (SUTURE) IMPLANT
SUT PROLENE 4 0 RB 1 (SUTURE) ×4
SUT PROLENE 4 0 SH DA (SUTURE) ×4 IMPLANT
SUT PROLENE 4-0 RB1 .5 CRCL 36 (SUTURE) ×2 IMPLANT
SUT PROLENE 5 0 C 1 36 (SUTURE) IMPLANT
SUT PROLENE 6 0 C 1 30 (SUTURE) ×4 IMPLANT
SUT PROLENE 6 0 CC (SUTURE) ×20 IMPLANT
SUT PROLENE 8 0 BV175 6 (SUTURE) IMPLANT
SUT PROLENE BLUE 7 0 (SUTURE) ×8 IMPLANT
SUT PROLENE POLY MONO (SUTURE) ×2 IMPLANT
SUT SILK  1 MH (SUTURE)
SUT SILK 1 MH (SUTURE) IMPLANT
SUT SILK 2 0 SH CR/8 (SUTURE) ×2 IMPLANT
SUT SILK 3 0 SH CR/8 (SUTURE) IMPLANT
SUT STEEL 6MS V (SUTURE) ×8 IMPLANT
SUT STEEL SZ 6 DBL 3X14 BALL (SUTURE) ×4 IMPLANT
SUT VIC AB 1 CTX 36 (SUTURE) ×8
SUT VIC AB 1 CTX36XBRD ANBCTR (SUTURE) ×4 IMPLANT
SUT VIC AB 2-0 CT1 27 (SUTURE) ×4
SUT VIC AB 2-0 CT1 TAPERPNT 27 (SUTURE) IMPLANT
SUT VIC AB 2-0 CTX 27 (SUTURE) IMPLANT
SUT VIC AB 3-0 X1 27 (SUTURE) ×2 IMPLANT
SYSTEM SAHARA CHEST DRAIN ATS (WOUND CARE) ×4 IMPLANT
TAPE CLOTH SURG 4X10 WHT LF (GAUZE/BANDAGES/DRESSINGS) ×2 IMPLANT
TAPE PAPER 2X10 WHT MICROPORE (GAUZE/BANDAGES/DRESSINGS) ×2 IMPLANT
TOWEL GREEN STERILE (TOWEL DISPOSABLE) ×4 IMPLANT
TOWEL GREEN STERILE FF (TOWEL DISPOSABLE) ×4 IMPLANT
TRAY FOLEY SLVR 16FR TEMP STAT (SET/KITS/TRAYS/PACK) ×4 IMPLANT
TUBING INSUFFLATION (TUBING) ×4 IMPLANT
UNDERPAD 30X30 (UNDERPADS AND DIAPERS) ×4 IMPLANT
WATER STERILE IRR 1000ML POUR (IV SOLUTION) ×8 IMPLANT

## 2017-12-23 NOTE — Anesthesia Procedure Notes (Signed)
Procedure Name: Intubation Date/Time: 12/23/2017 8:18 AM Performed by: Lowella Dell, CRNA Pre-anesthesia Checklist: Patient identified, Emergency Drugs available, Suction available and Patient being monitored Patient Re-evaluated:Patient Re-evaluated prior to induction Oxygen Delivery Method: Circle System Utilized Preoxygenation: Pre-oxygenation with 100% oxygen Induction Type: IV induction Ventilation: Mask ventilation without difficulty Laryngoscope Size: Mac and 3 Grade View: Grade III Tube type: Oral Tube size: 8.5 mm Number of attempts: 1 Airway Equipment and Method: Stylet Placement Confirmation: ETT inserted through vocal cords under direct vision,  positive ETCO2 and breath sounds checked- equal and bilateral Secured at: 22 cm Tube secured with: Tape Dental Injury: Teeth and Oropharynx as per pre-operative assessment

## 2017-12-23 NOTE — Plan of Care (Signed)
  Problem: Education: Goal: Knowledge of General Education information will improve Description Including pain rating scale, medication(s)/side effects and non-pharmacologic comfort measures Outcome: Progressing   Problem: Health Behavior/Discharge Planning: Goal: Ability to manage health-related needs will improve Outcome: Progressing   Problem: Clinical Measurements: Goal: Ability to maintain clinical measurements within normal limits will improve Outcome: Progressing Goal: Respiratory complications will improve Outcome: Progressing   Problem: Nutrition: Goal: Adequate nutrition will be maintained Outcome: Progressing   Problem: Coping: Goal: Level of anxiety will decrease Outcome: Progressing   Problem: Elimination: Goal: Will not experience complications related to urinary retention Outcome: Progressing   Problem: Pain Managment: Goal: General experience of comfort will improve Outcome: Progressing   Problem: Cardiovascular: Goal: Ability to achieve and maintain adequate cardiovascular perfusion will improve Outcome: Progressing Goal: Vascular access site(s) Level 0-1 will be maintained Outcome: Progressing

## 2017-12-23 NOTE — Anesthesia Procedure Notes (Signed)
Central Venous Catheter Insertion Performed by: Shelton Silvas, MD, anesthesiologist Start/End9/13/2019 6:40 AM, 12/23/2017 6:50 AM Patient location: Pre-op. Preanesthetic checklist: patient identified, IV checked, site marked, risks and benefits discussed, surgical consent, monitors and equipment checked, pre-op evaluation, timeout performed and anesthesia consent Lidocaine 1% used for infiltration and patient sedated Hand hygiene performed  and maximum sterile barriers used  Catheter size: 9 Fr Sheath introducer Procedure performed using ultrasound guided technique. Ultrasound Notes:anatomy identified, needle tip was noted to be adjacent to the nerve/plexus identified, no ultrasound evidence of intravascular and/or intraneural injection and image(s) printed for medical record Attempts: 1 Following insertion, line sutured and dressing applied. Post procedure assessment: blood return through all ports, free fluid flow and no air  Patient tolerated the procedure well with no immediate complications.

## 2017-12-23 NOTE — Progress Notes (Deleted)
  Echocardiogram 2D Echocardiogram has been performed.  Delcie Roch 12/23/2017, 10:25 AM

## 2017-12-23 NOTE — OR Nursing (Signed)
13:20 - 45 minute call to SICU charge nurse 13:50 - 20 minute call to SICU charge nurse

## 2017-12-23 NOTE — Procedures (Signed)
Extubation Procedure Note  Patient Details:   Name: Glenn [name] DOB: [birth year] MRN: [id number]   Airway Documentation:  Airway 8.5 mm (Active)  Secured at (cm) 23 cm 12/23/2017  5:37 PM  Measured From Lips 12/23/2017  5:37 PM  Secured Location Right 12/23/2017  5:37 PM  Secured By Caron Presume Tape 12/23/2017  5:37 PM  Site Condition Dry 12/23/2017  5:37 PM   Vent end date: (not recorded) Vent end time: (not recorded)   Evaluation  O2 sats: stable throughout Complications: No apparent complications Patient did tolerate procedure well. Bilateral Breath Sounds: Clear   Yes   Patient extubated to 4lnc. Vital signs stable at this time. No complications. RN at bedside. RT will continue to monitor.  Ave Filter 12/23/2017, 6:38 PM

## 2017-12-23 NOTE — Anesthesia Postprocedure Evaluation (Signed)
Anesthesia Post Note  Patient: Leta Speller  Procedure(s) Performed: CORONARY ARTERY BYPASS GRAFTING (CABG) x Five , using left internal mammary artery and right leg greater saphenous vein harvested endoscopically (N/A Chest) TRANSESOPHAGEAL ECHOCARDIOGRAM (TEE) (N/A )     Patient location during evaluation: SICU Anesthesia Type: General Level of consciousness: patient remains intubated per anesthesia plan Pain management: pain level controlled Vital Signs Assessment: post-procedure vital signs reviewed and stable Respiratory status: patient on ventilator - see flowsheet for VS Cardiovascular status: blood pressure returned to baseline and stable Anesthetic complications: no    Last Vitals:  Vitals:   12/23/17 1700 12/23/17 1737  BP: 108/75 (!) 105/58  Pulse: 72 73  Resp: 13 12  Temp: (!) 35.9 C   SpO2: 100%     Last Pain:  Vitals:   12/23/17 0517  TempSrc:   PainSc: Asleep                 Heydy Montilla DAVID

## 2017-12-23 NOTE — Anesthesia Procedure Notes (Signed)
Arterial Line Insertion Start/End9/13/2019 6:51 AM, 12/23/2017 7:00 AM Performed by: Demetrio Lapping, CRNA, CRNA  Patient location: Pre-op. Preanesthetic checklist: patient identified, IV checked, site marked, risks and benefits discussed, surgical consent, monitors and equipment checked, pre-op evaluation, timeout performed and anesthesia consent Patient sedated Right, radial was placed Catheter size: 20 G Hand hygiene performed  and maximum sterile barriers used   Attempts: 2 Procedure performed without using ultrasound guided technique. Following insertion, Biopatch and dressing applied. Post procedure assessment: normal  Post procedure complications: local hematoma (pressure applied x5 min with complete resolution, dressing applied). Patient tolerated the procedure well with no immediate complications.

## 2017-12-23 NOTE — Progress Notes (Signed)
ANTICOAGULATION CONSULT NOTE - Follow-Up  Pharmacy Consult for heparin Indication: Multivessel CAD  No Known Allergies  Patient Measurements: Height: 5\' 9"  (175.3 cm) Weight: 173 lb 15.1 oz (78.9 kg) IBW/kg (Calculated) : 70.7 Heparin Dosing Weight: 79 kg  Vital Signs: Temp: 97.7 F (36.5 C) (09/12 2000) Temp Source: Oral (09/12 2000) BP: 96/66 (09/13 0100) Pulse Rate: 46 (09/13 0100)  Labs: Recent Labs    12/22/17 0956 12/22/17 2337  HGB 15.3  --   HCT 45.5  --   PLT 204  --   HEPARINUNFRC  --  0.18*  CREATININE 1.19  --   TROPONINI 0.03*  --     Estimated Creatinine Clearance: 64.4 mL/min (by C-G formula based on SCr of 1.19 mg/dL).   Medical History: Past Medical History:  Diagnosis Date  . Essential hypertension   . Hyperlipidemia   . Impaired fasting glucose   . Other fatigue   . Palpitations     Medications:  Medications Prior to Admission  Medication Sig Dispense Refill Last Dose  . aspirin EC 81 MG tablet Take 81 mg by mouth daily.   12/21/2017 at Unknown time  . escitalopram (LEXAPRO) 10 MG tablet Take 10 mg by mouth daily.   12/21/2017 at Unknown time  . losartan (COZAAR) 25 MG tablet Take 25 mg by mouth daily.   12/21/2017 at Unknown time  . Menthol-Methyl Salicylate (BENGAY GREASELESS EX) Apply 1 application topically as needed (shoulder pain).   Past Week at Unknown time  . multivitamin-iron-minerals-folic acid (CENTRUM) chewable tablet Chew 1 tablet by mouth daily.   12/21/2017 at Unknown time  . ranitidine (ZANTAC) 75 MG tablet Take 75 mg by mouth as needed for heartburn.    12/18/2017    Assessment: 15 YOM who underwent VT arrest during an exercise stress test on 9/12 and was taken for emergent cath which showed multivessel CAD - now awaiting CVTS consultation for CABG scheduled at 07:15 12/23/17. Heparin drip resumed 2 hours after TR band deflation.  Initial 6 hr heparin level is 0.18 on 1000 units/hr.  No issues with infusion and no bleeding  noted per RN's report.    Goal of Therapy:  Heparin level 0.3-0.7 units/ml Monitor platelets by anticoagulation protocol: Yes   Plan:  Increase Heparin at 1250 units/hr.  - Will continue to monitor for any signs/symptoms of bleeding and will follow up with heparin levels.  Heparin to DC on call to OR.  Thank you for allowing pharmacy to be a part of this patient's care. Noah Delaine, RPh Clinical Pharmacist Please check AMION for all [facility] Pharmacy numbers 12/23/2017 1:06 AM

## 2017-12-23 NOTE — Anesthesia Procedure Notes (Signed)
Central Venous Catheter Insertion Performed by: Shelton Silvas, MD, anesthesiologist Start/End9/13/2019 6:50 AM, 12/23/2017 6:53 AM Patient location: Pre-op. Preanesthetic checklist: patient identified, IV checked, site marked, risks and benefits discussed, surgical consent, monitors and equipment checked, pre-op evaluation, timeout performed and anesthesia consent Hand hygiene performed  and maximum sterile barriers used  PA cath was placed.Swan type:thermodilution Procedure performed without using ultrasound guided technique. Attempts: 1 Patient tolerated the procedure well with no immediate complications.

## 2017-12-23 NOTE — Brief Op Note (Signed)
12/22/2017 - 12/23/2017  12:27 PM  PATIENT:  Glenn [name]  62 y.o. male  PRE-OPERATIVE DIAGNOSIS:  CAD  POST-OPERATIVE DIAGNOSIS:  CAD  PROCEDURE:  Procedure(s): CORONARY ARTERY BYPASS GRAFTING (CABG) x Five , using left internal mammary artery and right leg greater saphenous vein harvested endoscopically (N/A) TRANSESOPHAGEAL ECHOCARDIOGRAM (TEE) (N/A)  SURGEON:  Surgeon(s) and Role:    Kerin Perna, MD - Primary  PHYSICIAN ASSISTANT: WAYNE GOLD PA-C  ANESTHESIA:   general  EBL:  450 cc  BLOOD ADMINISTERED:2 FFP  DRAINS: DRAINS IN PLEURAL AND PERICARDIAL SPACE   LOCAL MEDICATIONS USED:  NONE  SPECIMEN:  No Specimen  DISPOSITION OF SPECIMEN:  N/A  COUNTS:  YES  TOURNIQUET:  * No tourniquets in log *  DICTATION: .Other Dictation: Dictation Number [id number]  PLAN OF CARE: Admit to inpatient   PATIENT DISPOSITION:  ICU - intubated and hemodynamically stable.   Delay start of Pharmacological VTE agent (>24hrs) due to surgical blood loss or risk of bleeding: yes

## 2017-12-23 NOTE — Transfer of Care (Signed)
Immediate Anesthesia Transfer of Care Note  Patient: Glenn [name]  Procedure(s) Performed: CORONARY ARTERY BYPASS GRAFTING (CABG) x Five , using left internal mammary artery and right leg greater saphenous vein harvested endoscopically (N/A Chest) TRANSESOPHAGEAL ECHOCARDIOGRAM (TEE) (N/A )  Patient Location: SICU  Anesthesia Type:General  Level of Consciousness: sedated and Patient remains intubated per anesthesia plan  Airway & Oxygen Therapy: Patient remains intubated per anesthesia plan and Patient placed on Ventilator (see vital sign flow sheet for setting)  Post-op Assessment: Report given to RN and Post -op Vital signs reviewed and stable  Post vital signs: stable  Last Vitals:  Vitals Value Taken Time  BP 102/54   Temp 36.3 C 12/23/2017  2:47 PM  Pulse 90 (paced)_ 12/23/2017  2:47 PM  Resp 11 12/23/2017  2:47 PM  SpO2 PAP 99 % 21/9 12/23/2017  2:47 PM  Vitals shown include unvalidated device data.   Last Pain:  Vitals:   12/23/17 0517  TempSrc:   PainSc: Asleep      Patients Stated Pain Goal: 0 (12/22/17 1600)  Complications: No apparent anesthesia complications

## 2017-12-23 NOTE — Progress Notes (Signed)
  Echocardiogram Echocardiogram Transesophageal has been performed.  Delcie Roch 12/23/2017, 10:25 AM

## 2017-12-23 NOTE — Progress Notes (Signed)
Pt transported to Pre-op via stretcher, on room air with heparin & amiodarone gtt infusing (see MAR). Family at bedside. Signed consents in chart. Report given to CRNA. Care relinquished to surgical team at this time.

## 2017-12-23 NOTE — Anesthesia Preprocedure Evaluation (Signed)
Anesthesia Evaluation  Patient identified by MRN, date of birth, ID band Patient awake    Reviewed: Allergy & Precautions, NPO status , Patient's Chart, lab work & pertinent test results  Airway Mallampati: I  TM Distance: >3 FB Neck ROM: Full    Dental   Pulmonary former smoker,    Pulmonary exam normal        Cardiovascular hypertension, Pt. on medications Normal cardiovascular exam     Neuro/Psych    GI/Hepatic   Endo/Other    Renal/GU      Musculoskeletal   Abdominal   Peds  Hematology   Anesthesia Other Findings   Reproductive/Obstetrics                             Anesthesia Physical Anesthesia Plan  ASA: III  Anesthesia Plan: General   Post-op Pain Management:    Induction: Intravenous  PONV Risk Score and Plan: 2 and Ondansetron and Treatment may vary due to age or medical condition  Airway Management Planned: Oral ETT  Additional Equipment: Arterial line, CVP, PA Cath, TEE and Ultrasound Guidance Line Placement  Intra-op Plan:   Post-operative Plan: Post-operative intubation/ventilation  Informed Consent: I have reviewed the patients History and Physical, chart, labs and discussed the procedure including the risks, benefits and alternatives for the proposed anesthesia with the patient or authorized representative who has indicated his/her understanding and acceptance.     Plan Discussed with: Surgeon and CRNA  Anesthesia Plan Comments:         Anesthesia Quick Evaluation

## 2017-12-23 NOTE — Progress Notes (Signed)
Pre Procedure note for inpatients:   Glenn [name] has been scheduled for Procedure(s): CORONARY ARTERY BYPASS GRAFTING (CABG) (N/A) TRANSESOPHAGEAL ECHOCARDIOGRAM (TEE) (N/A) today. The various methods of treatment have been discussed with the patient. After consideration of the risks, benefits and treatment options the patient has consented to the planned procedure.   The patient has been seen and labs reviewed. There are no changes in the patient's condition to prevent proceeding with the planned procedure today.  Recent labs:  Lab Results  Component Value Date   WBC 6.1 12/23/2017   HGB 13.2 12/23/2017   HCT 39.8 12/23/2017   PLT 146 (L) 12/23/2017   GLUCOSE 105 (H) 12/23/2017   CHOL 196 12/23/2017   TRIG 60 12/23/2017   HDL 48 12/23/2017   LDLCALC 136 (H) 12/23/2017   ALT 24 12/23/2017   AST 24 12/23/2017   NA 140 12/23/2017   K 3.7 12/23/2017   CL 106 12/23/2017   CREATININE 1.05 12/23/2017   BUN 15 12/23/2017   CO2 25 12/23/2017   HGBA1C 5.6 12/23/2017    Mikey Bussing, MD 12/23/2017 7:22 AM

## 2017-12-24 ENCOUNTER — Inpatient Hospital Stay (HOSPITAL_COMMUNITY): Payer: 59

## 2017-12-24 DIAGNOSIS — Z951 Presence of aortocoronary bypass graft: Secondary | ICD-10-CM

## 2017-12-24 LAB — BASIC METABOLIC PANEL
ANION GAP: 9 (ref 5–15)
BUN: 12 mg/dL (ref 8–23)
CO2: 22 mmol/L (ref 22–32)
Calcium: 8.2 mg/dL — ABNORMAL LOW (ref 8.9–10.3)
Chloride: 105 mmol/L (ref 98–111)
Creatinine, Ser: 0.85 mg/dL (ref 0.61–1.24)
Glucose, Bld: 115 mg/dL — ABNORMAL HIGH (ref 70–99)
POTASSIUM: 4 mmol/L (ref 3.5–5.1)
SODIUM: 136 mmol/L (ref 135–145)

## 2017-12-24 LAB — POCT I-STAT 3, ART BLOOD GAS (G3+)
Acid-base deficit: 8 mmol/L — ABNORMAL HIGH (ref 0.0–2.0)
Acid-base deficit: 4 mmol/L — ABNORMAL HIGH (ref 0.0–2.0)
BICARBONATE: 18.1 mmol/L — AB (ref 20.0–28.0)
Bicarbonate: 21.5 mmol/L (ref 20.0–28.0)
O2 SAT: 99 %
O2 Saturation: 99 %
PCO2 ART: 39.6 mmHg (ref 32.0–48.0)
PH ART: 7.279 — AB (ref 7.350–7.450)
PH ART: 7.339 — AB (ref 7.350–7.450)
PO2 ART: 169 mmHg — AB (ref 83.0–108.0)
Patient temperature: 38.5
Patient temperature: 36.3
TCO2: 19 mmol/L — ABNORMAL LOW (ref 22–32)
TCO2: 23 mmol/L (ref 22–32)
pCO2 arterial: 39.2 mmHg (ref 32.0–48.0)
pO2, Arterial: 131 mmHg — ABNORMAL HIGH (ref 83.0–108.0)

## 2017-12-24 LAB — CBC
HCT: 32.5 % — ABNORMAL LOW (ref 39.0–52.0)
HCT: 31.2 % — ABNORMAL LOW (ref 39.0–52.0)
HEMOGLOBIN: 10.6 g/dL — AB (ref 13.0–17.0)
HEMOGLOBIN: 10 g/dL — AB (ref 13.0–17.0)
MCH: 30.1 pg (ref 26.0–34.0)
MCH: 29.8 pg (ref 26.0–34.0)
MCHC: 32.6 g/dL (ref 30.0–36.0)
MCHC: 32.1 g/dL (ref 30.0–36.0)
MCV: 92.3 fL (ref 78.0–100.0)
MCV: 92.9 fL (ref 78.0–100.0)
PLATELETS: 130 10*3/uL — AB (ref 150–400)
Platelets: 97 10*3/uL — ABNORMAL LOW (ref 150–400)
RBC: 3.52 MIL/uL — AB (ref 4.22–5.81)
RBC: 3.36 MIL/uL — AB (ref 4.22–5.81)
RDW: 14.2 % (ref 11.5–15.5)
RDW: 14.2 % (ref 11.5–15.5)
WBC: 12.2 10*3/uL — AB (ref 4.0–10.5)
WBC: 10.8 10*3/uL — ABNORMAL HIGH (ref 4.0–10.5)

## 2017-12-24 LAB — TYPE AND SCREEN
ABO/RH(D): O NEG
Antibody Screen: NEGATIVE
Unit division: 0
Unit division: 0

## 2017-12-24 LAB — GLUCOSE, CAPILLARY
GLUCOSE-CAPILLARY: 104 mg/dL — AB (ref 70–99)
GLUCOSE-CAPILLARY: 113 mg/dL — AB (ref 70–99)
GLUCOSE-CAPILLARY: 100 mg/dL — AB (ref 70–99)
GLUCOSE-CAPILLARY: 99 mg/dL (ref 70–99)
GLUCOSE-CAPILLARY: 117 mg/dL — AB (ref 70–99)
Glucose-Capillary: 102 mg/dL — ABNORMAL HIGH (ref 70–99)
Glucose-Capillary: 106 mg/dL — ABNORMAL HIGH (ref 70–99)
Glucose-Capillary: 107 mg/dL — ABNORMAL HIGH (ref 70–99)
Glucose-Capillary: 112 mg/dL — ABNORMAL HIGH (ref 70–99)
Glucose-Capillary: 117 mg/dL — ABNORMAL HIGH (ref 70–99)
Glucose-Capillary: 117 mg/dL — ABNORMAL HIGH (ref 70–99)
Glucose-Capillary: 141 mg/dL — ABNORMAL HIGH (ref 70–99)
Glucose-Capillary: 82 mg/dL (ref 70–99)
Glucose-Capillary: 90 mg/dL (ref 70–99)
Glucose-Capillary: 95 mg/dL (ref 70–99)

## 2017-12-24 LAB — POCT I-STAT, CHEM 8
BUN: 17 mg/dL (ref 8–23)
CALCIUM ION: 1.25 mmol/L (ref 1.15–1.40)
Chloride: 94 mmol/L — ABNORMAL LOW (ref 98–111)
Creatinine, Ser: 0.9 mg/dL (ref 0.61–1.24)
GLUCOSE: 176 mg/dL — AB (ref 70–99)
HCT: 28 % — ABNORMAL LOW (ref 39.0–52.0)
Hemoglobin: 9.5 g/dL — ABNORMAL LOW (ref 13.0–17.0)
Potassium: 3.5 mmol/L (ref 3.5–5.1)
Sodium: 134 mmol/L — ABNORMAL LOW (ref 135–145)
TCO2: 23 mmol/L (ref 22–32)

## 2017-12-24 LAB — BPAM FFP
BLOOD PRODUCT EXPIRATION DATE: 201909172359
BLOOD PRODUCT EXPIRATION DATE: 201909172359
ISSUE DATE / TIME: 201909131203
ISSUE DATE / TIME: 201909131203
UNIT TYPE AND RH: 6200
Unit Type and Rh: 600

## 2017-12-24 LAB — PREPARE FRESH FROZEN PLASMA: Unit division: 0

## 2017-12-24 LAB — CREATININE, SERUM
Creatinine, Ser: 1 mg/dL (ref 0.61–1.24)
GFR calc Af Amer: >60 mL/min (ref >60)
GFR calc non Af Amer: >60 mL/min (ref >60)

## 2017-12-24 LAB — BPAM RBC
Blood Product Expiration Date: 201910142359
Blood Product Expiration Date: 201910142359
Unit Type and Rh: 9500
Unit Type and Rh: 9500

## 2017-12-24 LAB — MAGNESIUM
Magnesium: 2.3 mg/dL (ref 1.7–2.4)
Magnesium: 2 mg/dL (ref 1.7–2.4)

## 2017-12-24 MED ORDER — FUROSEMIDE 10 MG/ML IJ SOLN
40.0000 mg | Freq: Two times a day (BID) | INTRAMUSCULAR | Status: DC
  Administered 2017-12-24 – 2017-12-25 (×4): 40 mg via INTRAVENOUS
  Filled 2017-12-24 (×4): qty 4

## 2017-12-24 MED ORDER — POTASSIUM CHLORIDE 10 MEQ/50ML IV SOLN
10.0000 meq | INTRAVENOUS | Status: AC
  Administered 2017-12-24 (×3): 10 meq via INTRAVENOUS
  Filled 2017-12-24 (×3): qty 50

## 2017-12-24 MED ORDER — ATORVASTATIN CALCIUM 80 MG PO TABS
80.0000 mg | ORAL_TABLET | Freq: Every day | ORAL | Status: DC
  Administered 2017-12-24 – 2017-12-27 (×4): 80 mg via ORAL
  Filled 2017-12-24 (×4): qty 1

## 2017-12-24 MED ORDER — INSULIN ASPART 100 UNIT/ML ~~LOC~~ SOLN
0.0000 [IU] | SUBCUTANEOUS | Status: DC
  Administered 2017-12-25 (×2): 2 [IU] via SUBCUTANEOUS

## 2017-12-24 MED ORDER — MAGIC MOUTHWASH
5.0000 mL | Freq: Three times a day (TID) | ORAL | Status: DC | PRN
  Filled 2017-12-24 (×2): qty 5

## 2017-12-24 NOTE — Plan of Care (Signed)
  Problem: Education: Goal: Knowledge of General Education information will improve Description Including pain rating scale, medication(s)/side effects and non-pharmacologic comfort measures Outcome: Progressing   Problem: Health Behavior/Discharge Planning: Goal: Ability to manage health-related needs will improve Outcome: Progressing   Problem: Clinical Measurements: Goal: Ability to maintain clinical measurements within normal limits will improve Outcome: Progressing Goal: Will remain free from infection Outcome: Progressing Goal: Diagnostic test results will improve Outcome: Progressing Goal: Respiratory complications will improve Outcome: Progressing Goal: Cardiovascular complication will be avoided Outcome: Progressing   Problem: Activity: Goal: Risk for activity intolerance will decrease Outcome: Progressing   Problem: Nutrition: Goal: Adequate nutrition will be maintained Outcome: Progressing   Problem: Coping: Goal: Level of anxiety will decrease Outcome: Progressing   Problem: Elimination: Goal: Will not experience complications related to bowel motility Outcome: Progressing Goal: Will not experience complications related to urinary retention Outcome: Progressing   Problem: Pain Managment: Goal: General experience of comfort will improve Outcome: Progressing   Problem: Safety: Goal: Ability to remain free from injury will improve Outcome: Progressing   Problem: Skin Integrity: Goal: Risk for impaired skin integrity will decrease Outcome: Progressing   Problem: Activity: Goal: Ability to return to baseline activity level will improve Outcome: Progressing   Problem: Cardiovascular: Goal: Ability to achieve and maintain adequate cardiovascular perfusion will improve Outcome: Progressing Goal: Vascular access site(s) Level 0-1 will be maintained Outcome: Progressing   Problem: Education: Goal: Knowledge of disease or condition will  improve Outcome: Progressing   Problem: Activity: Goal: Risk for activity intolerance will decrease Outcome: Progressing   Problem: Cardiac: Goal: Will achieve and/or maintain hemodynamic stability Outcome: Progressing   Problem: Clinical Measurements: Goal: Postoperative complications will be avoided or minimized Outcome: Progressing   Problem: Respiratory: Goal: Respiratory status will improve Outcome: Progressing   Problem: Skin Integrity: Goal: Wound healing without signs and symptoms of infection Outcome: Progressing Goal: Risk for impaired skin integrity will decrease Outcome: Progressing   Problem: Urinary Elimination: Goal: Ability to achieve and maintain adequate renal perfusion and functioning will improve Outcome: Progressing

## 2017-12-24 NOTE — Progress Notes (Signed)
CT surgery p.m. Rounds  Patient had excellent postop day after urgent CABG x5 Up in chair Walk 50 feet Maintaining sinus rhythm Diuresing well

## 2017-12-24 NOTE — Progress Notes (Signed)
1 Day Post-Op Procedure(s) (LRB): CORONARY ARTERY BYPASS GRAFTING (CABG) x Five , using left internal mammary artery and right leg greater saphenous vein harvested endoscopically (N/A) TRANSESOPHAGEAL ECHOCARDIOGRAM (TEE) (N/A) Subjecti  Progressing after urgent CABGx5  Objective: Vital signs in last 24 hours: Temp:  [96.4 F (35.8 C)-98.6 F (37 C)] 98.4 F (36.9 C) (09/14 0815) Pulse Rate:  [65-91] 80 (09/14 0815) Cardiac Rhythm: Normal sinus rhythm (09/14 0800) Resp:  [7-21] 12 (09/14 0815) BP: (94-122)/(58-80) 114/68 (09/14 0800) SpO2:  [95 %-100 %] 95 % (09/14 0815) Arterial Line BP: (99-142)/(51-74) 127/52 (09/14 0800) FiO2 (%):  [40 %-50 %] 40 % (09/13 1737) Weight:  [83.4 kg] 83.4 kg (09/14 0553)  Hemodynamic parameters for last 24 hours: PAP: (20-34)/(1-18) 27/10 CO:  [3.7 L/min-5.8 L/min] 5.8 L/min CI:  [1.9 L/min/m2-3 L/min/m2] 3 L/min/m2  Intake/Output from previous day: 09/13 0701 - 09/14 0700 In: 7605.8 [P.O.:720; I.V.:4591.8; Blood:1286; IV Piggyback:1008.1] Out: 5407 [Urine:2787; Blood:1000; Chest Tube:520] Intake/Output this shift: Total I/O In: 175.7 [P.O.:120; I.V.:55.7] Out: 150 [Urine:100; Chest Tube:50]       Exam    General- alert and comfortable    Neck- no JVD, no cervical adenopathy palpable, no carotid bruit   Lungs- clear without rales, wheezes   Cor- regular rate and rhythm, no murmur , gallop   Abdomen- soft, non-tender   Extremities - warm, non-tender, minimal edema   Neuro- oriented, appropriate, no focal weakness   Lab Results: Recent Labs    12/23/17 2030 12/23/17 2102 12/24/17 0612  WBC 12.2*  --  12.2*  HGB 10.6* 8.8* 10.6*  HCT 31.9* 26.0* 32.5*  PLT 125*  --  130*   BMET:  Recent Labs    12/23/17 0520  12/23/17 2102 12/24/17 0612  NA 140   < > 140 136  K 3.7   < > 3.7 4.0  CL 106   < > 108 105  CO2 25  --   --  22  GLUCOSE 105*   < > 156* 115*  BUN 15   < > 11 12  CREATININE 1.05   < > 0.70 0.85  CALCIUM 9.0   --   --  8.2*   < > = values in this interval not displayed.    PT/INR:  Recent Labs    12/23/17 1438  LABPROT 19.6*  INR 1.67   ABG    Component Value Date/Time   PHART 7.339 (L) 12/23/2017 1939   HCO3 21.5 12/23/2017 1939   TCO2 20 (L) 12/23/2017 2102   ACIDBASEDEF 4.0 (H) 12/23/2017 1939   O2SAT 99.0 12/23/2017 1939   CBG (last 3)  Recent Labs    12/24/17 0608 12/24/17 0641 12/24/17 0809  GLUCAP 113* 107* 100*    Assessment/Plan: S/P Procedure(s) (LRB): CORONARY ARTERY BYPASS GRAFTING (CABG) x Five , using left internal mammary artery and right leg greater saphenous vein harvested endoscopically (N/A) TRANSESOPHAGEAL ECHOCARDIOGRAM (TEE) (N/A) Mobilize Diuresis Diabetes control d/c tubes/lines See progression orders continue milrinone   LOS: 2 days    Kathlee Nations Trigt III 12/24/2017

## 2017-12-24 NOTE — Progress Notes (Signed)
Progress Note  Patient Name: Glenn [name] Date of Encounter: 12/24/2017  Primary Cardiologist: Prentice Docker, MD   Subjective   Overall improving, no significant shortness of breath or chest pain, no fevers.  Does have some chest discomfort when taking in breath during incentive spirometer.  Expected.  Inpatient Medications    Scheduled Meds: . acetaminophen  1,000 mg Oral Q6H   Or  . acetaminophen (TYLENOL) oral liquid 160 mg/5 mL  1,000 mg Per Tube Q6H  . aspirin EC  325 mg Oral Daily   Or  . aspirin  324 mg Per Tube Daily  . atorvastatin  80 mg Oral q1800  . bisacodyl  10 mg Oral Daily   Or  . bisacodyl  10 mg Rectal Daily  . Chlorhexidine Gluconate Cloth  6 each Topical Daily  . docusate sodium  200 mg Oral Daily  . furosemide  40 mg Intravenous BID  . insulin aspart  0-24 Units Subcutaneous Q4H  . insulin regular  0-10 Units Intravenous TID WC  . mouth rinse  15 mL Mouth Rinse BID  . metoprolol tartrate  12.5 mg Oral BID   Or  . metoprolol tartrate  12.5 mg Per Tube BID  . [START ON 12/25/2017] pantoprazole  40 mg Oral Daily  . sodium chloride flush  10-40 mL Intracatheter Q12H  . sodium chloride flush  3 mL Intravenous Q12H   Continuous Infusions: . sodium chloride 10 mL/hr at 12/24/17 0900  . sodium chloride 10 mL/hr at 12/24/17 0900  . sodium chloride 10 mL/hr at 12/23/17 1430  . albumin human 12.5 g (12/23/17 2046)  . cefUROXime (ZINACEF)  IV Stopped (12/24/17 9562)  . insulin (NOVOLIN-R) infusion 1.6 mL/hr at 12/24/17 0900  . lactated ringers    . lactated ringers    . milrinone 0.25 mcg/kg/min (12/24/17 0900)  . nitroGLYCERIN Stopped (12/23/17 1454)  . norepinephrine (LEVOPHED) Adult infusion 2 mcg/min (12/24/17 0900)   PRN Meds: sodium chloride, albumin human, magic mouthwash, metoprolol tartrate, midazolam, morphine injection, ondansetron (ZOFRAN) IV, oxyCODONE, sodium chloride flush, sodium chloride flush, traMADol   Vital Signs    Vitals:     12/24/17 0700 12/24/17 0745 12/24/17 0800 12/24/17 0815  BP: 112/66  114/68   Pulse: 89 90 85 80  Resp: 19 17 18 12   Temp: 98.2 F (36.8 C) 98.2 F (36.8 C) 98.2 F (36.8 C) 98.4 F (36.9 C)  TempSrc: Core (Comment)     SpO2: 95% 97% 96% 95%  Weight:      Height:        Intake/Output Summary (Last 24 hours) at 12/24/2017 1046 Last data filed at 12/24/2017 0930 Gross per 24 hour  Intake 6800.63 ml  Output 5172 ml  Net 1628.63 ml   Filed Weights   12/22/17 1800 12/23/17 0530 12/24/17 0553  Weight: 78.9 kg 75.4 kg 83.4 kg    Telemetry    Currently sinus rhythm 81- Personally Reviewed  ECG    Sinus rhythm, right bundle branch block, T wave inversion inferior laterally- Personally Reviewed  Physical Exam   GEN: No acute distress.   Neck: No JVD Cardiac: RRR, no murmurs, rubs, or gallops.  Respiratory: Clear to auscultation bilaterally. GI: Soft, nontender, non-distended  MS: No edema; No deformity. Neuro:  Nonfocal  Psych: Normal affect   Labs    Chemistry Recent Labs  Lab 12/22/17 0956 12/23/17 0520  12/23/17 1327 12/23/17 1442 12/23/17 2030 12/23/17 2102 12/24/17 0612  NA 140 140   < >  138 141  --  140 136  K 3.6 3.7   < > 4.4 3.8  --  3.7 4.0  CL 107 106   < > 106  --   --  108 105  CO2 18* 25  --   --   --   --   --  22  GLUCOSE 128* 105*   < > 126* 120*  --  156* 115*  BUN 21 15   < > 13  --   --  11 12  CREATININE 1.19 1.05   < > 0.80  --  0.96 0.70 0.85  CALCIUM 9.3 9.0  --   --   --   --   --  8.2*  PROT  --  5.9*  --   --   --   --   --   --   ALBUMIN  --  3.9  --   --   --   --   --   --   AST  --  24  --   --   --   --   --   --   ALT  --  24  --   --   --   --   --   --   ALKPHOS  --  48  --   --   --   --   --   --   BILITOT  --  0.6  --   --   --   --   --   --   GFRNONAA >60 >60  --   --   --  >60  --  >60  GFRAA >60 >60  --   --   --  >60  --  >60  ANIONGAP 15 9  --   --   --   --   --  9   < > = values in this interval not  displayed.     Hematology Recent Labs  Lab 12/23/17 1438  12/23/17 2030 12/23/17 2102 12/24/17 0612  WBC 10.4  --  12.2*  --  12.2*  RBC 3.48*  --  3.49*  --  3.52*  HGB 10.6*   < > 10.6* 8.8* 10.6*  HCT 31.6*   < > 31.9* 26.0* 32.5*  MCV 90.8  --  91.4  --  92.3  MCH 30.5  --  30.4  --  30.1  MCHC 33.5  --  33.2  --  32.6  RDW 14.0  --  14.0  --  14.2  PLT 94*  --  125*  --  130*   < > = values in this interval not displayed.    Cardiac Enzymes Recent Labs  Lab 12/22/17 0956  TROPONINI 0.03*   No results for input(s): TROPIPOC in the last 168 hours.   BNPNo results for input(s): BNP, PROBNP in the last 168 hours.   DDimer No results for input(s): DDIMER in the last 168 hours.   Radiology    Nm Myocar Multi W/spect W/wall Motion / Ef  Result Date: 12/22/2017  Patient with severe ST depressions diffusely with exercise up to 4mm. Test was stopped, shortly after stopping treadmill patient went into ventricular tachycardia and became unresponsive. Succesful CPR and early defibrillation, transferred to emergency room and ultimately to [facility] for heart catheterization.    Dg Chest Port 1 View  Result Date: 12/24/2017 CLINICAL DATA:  Status post CABG EXAM: PORTABLE CHEST 1  VIEW COMPARISON:  Yesterday FINDINGS: Extubation with lower volumes. Atelectasis remains mild. Thoracic drains in place. Progression of Swan-Ganz catheter directed into the right main pulmonary artery. Trace left apical pneumothorax. Stable postoperative heart size. IMPRESSION: 1. Lower volumes after extubation.  Atelectasis is mild. 2. Trace left apical pneumothorax. Electronically Signed   By: Marnee Spring M.D.   On: 12/24/2017 08:30   Dg Chest Port 1 View  Result Date: 12/23/2017 CLINICAL DATA:  Status post bypass surgery. EXAM: PORTABLE CHEST 1 VIEW COMPARISON:  Preoperative chest x-ray 12/22/2017 FINDINGS: The endotracheal tube is 6 cm above the carina. The NG tube is coursing down the esophagus  and into the stomach. The right IJ Swan-Ganz catheter tip is in the main pulmonary artery trunk. Left-sided chest tube in good position without pneumothorax. Mediastinal drain tubes are in place. Streaky bibasilar atelectasis but no edema, definite effusions or pulmonary edema. IMPRESSION: 1. Postoperative support apparatus in good position without complicating features. 2. Minimal streaky atelectasis but no edema, effusions or pneumothorax. Electronically Signed   By: Rudie Meyer M.D.   On: 12/23/2017 15:19   Dg Chest Port 1 View  Result Date: 12/22/2017 CLINICAL DATA:  Preoperative evaluation. EXAM: PORTABLE CHEST 1 VIEW COMPARISON:  None. FINDINGS: Monitoring leads overlie the patient. Enlarged cardiac and mediastinal contours. Low lung volumes. No consolidative pulmonary opacities. Thoracic spine degenerative changes. IMPRESSION: Cardiomegaly.  No acute cardiopulmonary process. Electronically Signed   By: Annia Belt M.D.   On: 12/22/2017 20:38    Cardiac Studies      Echocardiogram: 12/22/2017-EF 40 to 45%  Patient Profile     62 y.o. male with cardiac arrest occurring on exercise treadmill test/NUC in the outpatient setting successfully resuscitated with urgent cardiac catheterization resulting in severe triple-vessel coronary artery disease.  CABG.  Ischemic cardiomyopathy EF 40 to 45% noted.  Assessment & Plan    Severe CAD -CABG - Secondary prevention, aspirin, beta-blocker, high intensity statin-I will start atorvastatin 80 mg once a day, ACE/ARB/Entresto when able  Ischemic cardiomyopathy/acute systolic heart failure -EF 40 to 45%.  Once off milrinone, continue to utilize beta-blocker adding ACE/ARB or Entresto if possible -Continue diuresis, mobilization, milrinone   Cardiac arrest -Successful resuscitation, urgent catheterization, transient use of IV amiodarone, ischemic culprit.  Occurred during exercise treadmill test portion of nuclear stress test in the outpatient  setting.  Discussed with family.  For questions or updates, please contact [facility] Please consult [email or web address] for contact info under        Signed, Donato Schultz, MD  12/24/2017, 10:46 AM

## 2017-12-25 ENCOUNTER — Inpatient Hospital Stay (HOSPITAL_COMMUNITY): Payer: 59

## 2017-12-25 LAB — GLUCOSE, CAPILLARY
GLUCOSE-CAPILLARY: 108 mg/dL — AB (ref 70–99)
GLUCOSE-CAPILLARY: 113 mg/dL — AB (ref 70–99)
GLUCOSE-CAPILLARY: 119 mg/dL — AB (ref 70–99)
GLUCOSE-CAPILLARY: 120 mg/dL — AB (ref 70–99)
GLUCOSE-CAPILLARY: 139 mg/dL — AB (ref 70–99)
Glucose-Capillary: 124 mg/dL — ABNORMAL HIGH (ref 70–99)

## 2017-12-25 LAB — BASIC METABOLIC PANEL
Anion gap: 6 (ref 5–15)
BUN: 18 mg/dL (ref 8–23)
CO2: 26 mmol/L (ref 22–32)
Calcium: 8.3 mg/dL — ABNORMAL LOW (ref 8.9–10.3)
Chloride: 99 mmol/L (ref 98–111)
Creatinine, Ser: 0.91 mg/dL (ref 0.61–1.24)
GFR calc Af Amer: >60 mL/min (ref >60)
GFR calc non Af Amer: >60 mL/min (ref >60)
Glucose, Bld: 124 mg/dL — ABNORMAL HIGH (ref 70–99)
Potassium: 4.4 mmol/L (ref 3.5–5.1)
Sodium: 131 mmol/L — ABNORMAL LOW (ref 135–145)

## 2017-12-25 LAB — CBC
HCT: 31.2 % — ABNORMAL LOW (ref 39.0–52.0)
Hemoglobin: 10.1 g/dL — ABNORMAL LOW (ref 13.0–17.0)
MCH: 29.9 pg (ref 26.0–34.0)
MCHC: 32.4 g/dL (ref 30.0–36.0)
MCV: 92.3 fL (ref 78.0–100.0)
Platelets: 95 10*3/uL — ABNORMAL LOW (ref 150–400)
RBC: 3.38 MIL/uL — ABNORMAL LOW (ref 4.22–5.81)
RDW: 14.1 % (ref 11.5–15.5)
WBC: 14 10*3/uL — ABNORMAL HIGH (ref 4.0–10.5)

## 2017-12-25 MED ORDER — SORBITOL 70 % PO SOLN
60.0000 mL | Freq: Once | ORAL | Status: AC
  Administered 2017-12-25: 60 mL via ORAL
  Filled 2017-12-25: qty 60

## 2017-12-25 MED ORDER — METOCLOPRAMIDE HCL 5 MG/ML IJ SOLN
10.0000 mg | Freq: Four times a day (QID) | INTRAMUSCULAR | Status: DC
  Administered 2017-12-25 (×3): 10 mg via INTRAVENOUS
  Filled 2017-12-25 (×3): qty 2

## 2017-12-25 MED ORDER — SIMETHICONE 80 MG PO CHEW
80.0000 mg | CHEWABLE_TABLET | Freq: Four times a day (QID) | ORAL | Status: DC
  Administered 2017-12-25 – 2017-12-28 (×12): 80 mg via ORAL
  Filled 2017-12-25 (×12): qty 1

## 2017-12-25 MED ORDER — POTASSIUM CHLORIDE CRYS ER 20 MEQ PO TBCR
20.0000 meq | EXTENDED_RELEASE_TABLET | Freq: Two times a day (BID) | ORAL | Status: DC
  Administered 2017-12-25 (×2): 20 meq via ORAL
  Filled 2017-12-25 (×2): qty 1

## 2017-12-25 MED ORDER — MORPHINE SULFATE (PF) 2 MG/ML IV SOLN: 2.0000 mg | INTRAVENOUS | Status: DC | PRN

## 2017-12-25 NOTE — Plan of Care (Signed)
  Problem: Education: Goal: Knowledge of General Education information will improve Description Including pain rating scale, medication(s)/side effects and non-pharmacologic comfort measures Outcome: Progressing   Problem: Health Behavior/Discharge Planning: Goal: Ability to manage health-related needs will improve Outcome: Progressing   Problem: Clinical Measurements: Goal: Ability to maintain clinical measurements within normal limits will improve Outcome: Progressing Goal: Will remain free from infection Outcome: Progressing Goal: Diagnostic test results will improve Outcome: Progressing Goal: Respiratory complications will improve Outcome: Progressing Goal: Cardiovascular complication will be avoided Outcome: Progressing   Problem: Activity: Goal: Risk for activity intolerance will decrease Outcome: Progressing   Problem: Nutrition: Goal: Adequate nutrition will be maintained Outcome: Progressing   Problem: Coping: Goal: Level of anxiety will decrease Outcome: Progressing   Problem: Elimination: Goal: Will not experience complications related to bowel motility Outcome: Progressing Goal: Will not experience complications related to urinary retention Outcome: Progressing   Problem: Pain Managment: Goal: General experience of comfort will improve Outcome: Progressing   Problem: Safety: Goal: Ability to remain free from injury will improve Outcome: Progressing   Problem: Skin Integrity: Goal: Risk for impaired skin integrity will decrease Outcome: Progressing   Problem: Activity: Goal: Ability to return to baseline activity level will improve Outcome: Progressing   Problem: Cardiovascular: Goal: Ability to achieve and maintain adequate cardiovascular perfusion will improve Outcome: Progressing Goal: Vascular access site(s) Level 0-1 will be maintained Outcome: Progressing   Problem: Education: Goal: Knowledge of disease or condition will  improve Outcome: Progressing Goal: Knowledge of the prescribed therapeutic regimen will improve Outcome: Progressing   Problem: Activity: Goal: Risk for activity intolerance will decrease Outcome: Progressing   Problem: Cardiac: Goal: Will achieve and/or maintain hemodynamic stability Outcome: Progressing   Problem: Clinical Measurements: Goal: Postoperative complications will be avoided or minimized Outcome: Progressing   Problem: Respiratory: Goal: Respiratory status will improve Outcome: Progressing   Problem: Skin Integrity: Goal: Wound healing without signs and symptoms of infection Outcome: Progressing Goal: Risk for impaired skin integrity will decrease Outcome: Progressing   Problem: Urinary Elimination: Goal: Ability to achieve and maintain adequate renal perfusion and functioning will improve Outcome: Progressing   

## 2017-12-25 NOTE — Op Note (Signed)
NAME: Glenn [name], Glenn [name] MEDICAL RECORD ZO:[id number] ACCOUNT [id number] DATE OF BIRTH:03-05-1956 FACILITY: [facility] LOCATION: [facility] PHYSICIAN:Kathleen Likins VAN TRIGT III, MD  OPERATIVE REPORT  DATE OF PROCEDURE:  12/23/2017  OPERATION: 1.  Coronary artery bypass grafting x5 (left internal mammary artery to left anterior descending, saphenous vein graft to diagonal, sequential saphenous vein graft to ramus intermedius and circumflex marginal, saphenous vein graft to posterior  descending. 2.  Endoscopic harvest of right leg greater saphenous vein.  SURGEON:  Mikey Bussing, MD  ASSISTANT:  Gershon Crane, PA-C  PREOPERATIVE DIAGNOSES: 1.  Severe left main and 3-vessel coronary artery disease with acute cardiac arrest during treadmill stress test. 2.  Moderate left ventricular dysfunction.  POSTOPERATIVE DIAGNOSES:   1.  Severe left main and 3-vessel coronary artery disease with acute cardiac arrest during treadmill stress test. 2.  Moderate left ventricular dysfunction.  ANESTHESIA:  General.  CLINICAL NOTE:  The patient is a 62 year old reformed smoker with hypertension and chest pain who is undergoing a treadmill evaluation when he had V-tach arrest in the stress test lab.  He underwent CPR and then was shocked from V-tach back to a regular  rhythm with a pulse.  He was taken to the cath lab at [facility] and had left heart catheterization and found to have severe left main and 3-vessel disease.  He was transferred to this hospital for surgical evaluation for urgent coronary bypass grafting.   He was awake and alert with hemodynamically stable blood pressure and oxygen saturation.  An echocardiogram showed global LV dysfunction, EF 35% to 40%.  After assessing the patient and his echo and imaging studies, an arteriogram as well as his chest  x-ray, I agreed with the recommendation for urgent coronary artery bypass grafting.  I discussed the procedure of CABG with the patient and his family.   They understood that the surgery would be done within 24 hours.  They understood the location of the  surgical incision, the use of general anesthesia and cardiopulmonary bypass, and the expected postoperative hospital recovery.  I discussed with the patient and family the risks to him of the operation including the risk of stroke, bleeding, blood  transfusion, postoperative arrhythmias, postoperative pulmonary problems including pleural effusion, postoperative organ failure, postoperative infection, and death.  He demonstrated his understanding and agreed to proceed with surgery under what I felt  was an informed consent.  OPERATIVE FINDINGS: 1.  No significant myocardial edema or hemorrhage. 2.  Adequate conduit, although the veins were somewhat thickened. 3.  No blood products required. 4.  Improved global LV function after separation from cardiopulmonary bypass after CABG x5.  DESCRIPTION OF PROCEDURE:  The patient was brought from preop holding to the operating room after informed consent was documented and all final questions addressed.  The patient was placed supine on the operating table and general anesthesia was  achieved.  He remained stable.  A transesophageal echo probe was placed by the anesthesiologist.  The patient was prepped and draped as a sterile field.  A proper time-out was performed.  A sternal incision was made as the saphenous vein was harvested  endoscopically from the right leg.  The left internal mammary artery was harvested as a pedicle graft from its origin at the subclavian vessels.  The sternal retractor was placed and the pericardium opened and suspended.  The heart was inspected.  There  was no evidence of scarring or hemorrhage.  Pursestrings were placed in the ascending aorta and right atrium and  heparin was administered.  The patient was cannulated and placed on bypass.  The coronaries were identified for grafting, and the mammary  artery and vein grafts were  prepared for the distal anastomosis.  Cardioplegia cannulas were placed both antegrade and retrograde cold blood cardioplegia.  The patient was cooled to 32 degrees, and the aortic crossclamp was applied.  One liter of cold  blood cardioplegia was delivered in split doses between the antegrade aortic and retrograde coronary sinus catheters.  Good cardioplegic arrest and supple temperature dropped less than 12 degrees.  Cardioplegia was delivered every 20 minutes while the  cross clamp was in place.  The distal coronary anastomoses were performed.  The first distal anastomosis was the posterior descending branch of the right coronary.  This had a proximal 95% stenosis.  A reverse saphenous vein was sewn end-to-side with running 7-0 Prolene with good  flow through the graft.  Cardioplegia was redosed.  The second distal anastomosis was the sequential vein graft to the OM and the ramus intermedius.  The ramus intermedius was a 1.5 mm vessel, proximal 95% stenosis.  The vein was sewn in a side-to-side fashion with running 7-0 Prolene with good flow  through the graft. The vein then continued to graft into the first OM branch of the circumflex.  This is a 1.5 mm vessel, proximal 95% stenosis.  The end of the vein was sewn end-to-side with running 7-0 Prolene.  There was good flow through the graft.   Cardioplegia was redosed.  The fourth distal anastomosis was to the diagonal branch of the LAD.  There was a smaller 1.4 mm vessel, proximal ostial 95% stenosis.  Reverse saphenous vein was sewn end-to-side with running 7-0 Prolene with good flow through the graft.  Cardioplegia  was redosed.  The fifth distal anastomosis was the mid to distal third of the LAD.  There was a proximal 95% stenosis.  The left IMA pedicle was brought through an opening in the left lateral pericardium and was brought down onto the LAD and sewn end-to-side with  running 8-0 Prolene.  There was good flow through the anastomosis after  briefly releasing the pedicle bulldog and the mammary artery.  The bulldog was reapplied and the pedicle was secured to the epicardium with 6-0 Prolenes.  Cardioplegia was redosed.  With the crossclamp still in place, 3 proximal vein anastomoses were performed using a running 6-0 Prolene in a 4.5 mm punch.  Prior to tying down the final proximal anastomosis, air was vented from the coronaries with a dose of retrograde warm blood  cardioplegia.  The crossclamp was removed.  The heart resumed a spontaneous rhythm.  The vein grafts were deaired and opened and each had good flow.  Hemostasis was documented at the proximal and distal sites.  The patient was rewarmed and reperfused.  Temporary pacing wires were applied.  The lungs reexpanded.  Ventilator was resumed.  The patient was then weaned from cardiopulmonary bypass without difficulty.  Cardiac output and blood pressure were  satisfactory.  Echo showed improvement in global LV function.  Protamine was administered without adverse reaction.  The cannulas were removed.  The mediastinum was irrigated.  The superior pericardial fat was closed over the aorta and vein grafts.  The  anterior mediastinum and left pleural chest tubes were placed and brought out through separate incisions.  The sternum was closed with a wire.  The patient remained stable.  The pectoralis fascia and subcutaneous layers were closed with a running  Vicryl.   The skin was closed with a subcuticular Vicryl.  Sterile dressings were applied, and the patient was transported back to the ICU in critical but stable condition.  LN/NUANCE  D:12/25/2017 T:12/25/2017 JOB:[id number]

## 2017-12-25 NOTE — Progress Notes (Signed)
CT surgery p.m. Rounds  Patient examined and record reviewed.Hemodynamics stable,labs satisfactory.Patient had stable day.Continue current care. Letoya Stallone Van Trigt III 12/25/2017   

## 2017-12-25 NOTE — Progress Notes (Signed)
Progress Note  Patient Name: Glenn [name] Date of Encounter: 12/25/2017  Primary Cardiologist: Prentice Docker, MD   Subjective   Still having mild discomfort in his chest when taking a deep breath.  Otherwise beginning to mobilize.  No significant shortness of breath.  Inpatient Medications    Scheduled Meds: . acetaminophen  1,000 mg Oral Q6H   Or  . acetaminophen (TYLENOL) oral liquid 160 mg/5 mL  1,000 mg Per Tube Q6H  . aspirin EC  325 mg Oral Daily   Or  . aspirin  324 mg Per Tube Daily  . atorvastatin  80 mg Oral q1800  . bisacodyl  10 mg Oral Daily   Or  . bisacodyl  10 mg Rectal Daily  . docusate sodium  200 mg Oral Daily  . furosemide  40 mg Intravenous BID  . insulin aspart  0-24 Units Subcutaneous Q4H  . mouth rinse  15 mL Mouth Rinse BID  . metoCLOPramide (REGLAN) injection  10 mg Intravenous Q6H  . metoprolol tartrate  12.5 mg Oral BID   Or  . metoprolol tartrate  12.5 mg Per Tube BID  . pantoprazole  40 mg Oral Daily  . potassium chloride  20 mEq Oral BID  . simethicone  80 mg Oral QID  . sodium chloride flush  3 mL Intravenous Q12H   Continuous Infusions: . sodium chloride Stopped (12/25/17 1032)  . milrinone 0.125 mcg/kg/min (12/25/17 1200)   PRN Meds: magic mouthwash, metoprolol tartrate, morphine injection, ondansetron (ZOFRAN) IV, oxyCODONE, sodium chloride flush, traMADol   Vital Signs    Vitals:   12/25/17 0900 12/25/17 1000 12/25/17 1100 12/25/17 1200  BP:  130/71 126/80 104/84  Pulse: 74 82 83 85  Resp: 16 18 18 20   Temp:      TempSrc:      SpO2: 94% 93% 96% 98%  Weight:      Height:        Intake/Output Summary (Last 24 hours) at 12/25/2017 1223 Last data filed at 12/25/2017 1200 Gross per 24 hour  Intake 696.38 ml  Output 4265 ml  Net -3568.62 ml   Filed Weights   12/23/17 0530 12/24/17 0553 12/25/17 0640  Weight: 75.4 kg 83.4 kg 84.8 kg    Telemetry    Currently sinus rhythm 81- Personally Reviewed  ECG      Sinus rhythm, right bundle branch block, T wave inversion inferior laterally- Personally Reviewed  Physical Exam   GEN: No acute distress.   Neck: No JVD Cardiac: RRR, no murmurs, rubs, or gallops.  Respiratory: Clear to auscultation bilaterally. GI: Soft, nontender, non-distended  MS: No edema; No deformity. Neuro:  Nonfocal  Psych: Normal affect   Labs    Chemistry Recent Labs  Lab 12/23/17 0520  12/24/17 0612 12/24/17 1719 12/24/17 1747 12/25/17 0423  NA 140   < > 136  --  134* 131*  K 3.7   < > 4.0  --  3.5 4.4  CL 106   < > 105  --  94* 99  CO2 25  --  22  --   --  26  GLUCOSE 105*   < > 115*  --  176* 124*  BUN 15   < > 12  --  17 18  CREATININE 1.05   < > 0.85 1.00 0.90 0.91  CALCIUM 9.0  --  8.2*  --   --  8.3*  PROT 5.9*  --   --   --   --   --  ALBUMIN 3.9  --   --   --   --   --   AST 24  --   --   --   --   --   ALT 24  --   --   --   --   --   ALKPHOS 48  --   --   --   --   --   BILITOT 0.6  --   --   --   --   --   GFRNONAA >60   < > >60 >60  --  >60  GFRAA >60   < > >60 >60  --  >60  ANIONGAP 9  --  9  --   --  6   < > = values in this interval not displayed.     Hematology Recent Labs  Lab 12/24/17 0612 12/24/17 1719 12/24/17 1747 12/25/17 0423  WBC 12.2* 10.8*  --  14.0*  RBC 3.52* 3.36*  --  3.38*  HGB 10.6* 10.0* 9.5* 10.1*  HCT 32.5* 31.2* 28.0* 31.2*  MCV 92.3 92.9  --  92.3  MCH 30.1 29.8  --  29.9  MCHC 32.6 32.1  --  32.4  RDW 14.2 14.2  --  14.1  PLT 130* 97*  --  95*    Cardiac Enzymes Recent Labs  Lab 12/22/17 0956  TROPONINI 0.03*   No results for input(s): TROPIPOC in the last 168 hours.   BNPNo results for input(s): BNP, PROBNP in the last 168 hours.   DDimer No results for input(s): DDIMER in the last 168 hours.   Radiology    Dg Chest Port 1 View  Result Date: 12/25/2017 CLINICAL DATA:  Status post coronary bypass grafting EXAM: PORTABLE CHEST 1 VIEW COMPARISON:  12/24/2017 FINDINGS: Cardiac shadow is  enlarged but stable. Postsurgical changes are again seen. Left thoracostomy catheter and right jugular sheath are again seen. The Swan-Ganz catheter mediastinal drain have been removed. Bibasilar atelectatic changes are again noted. A tiny left apical pneumothorax is again seen and stable. No new focal abnormality is noted. IMPRESSION: Bibasilar atelectasis stable from the previous exam. Stable left apical pneumothorax. Electronically Signed   By: Alcide Clever M.D.   On: 12/25/2017 07:16   Dg Chest Port 1 View  Result Date: 12/24/2017 CLINICAL DATA:  Status post CABG EXAM: PORTABLE CHEST 1 VIEW COMPARISON:  Yesterday FINDINGS: Extubation with lower volumes. Atelectasis remains mild. Thoracic drains in place. Progression of Swan-Ganz catheter directed into the right main pulmonary artery. Trace left apical pneumothorax. Stable postoperative heart size. IMPRESSION: 1. Lower volumes after extubation.  Atelectasis is mild. 2. Trace left apical pneumothorax. Electronically Signed   By: Marnee Spring M.D.   On: 12/24/2017 08:30   Dg Chest Port 1 View  Result Date: 12/23/2017 CLINICAL DATA:  Status post bypass surgery. EXAM: PORTABLE CHEST 1 VIEW COMPARISON:  Preoperative chest x-ray 12/22/2017 FINDINGS: The endotracheal tube is 6 cm above the carina. The NG tube is coursing down the esophagus and into the stomach. The right IJ Swan-Ganz catheter tip is in the main pulmonary artery trunk. Left-sided chest tube in good position without pneumothorax. Mediastinal drain tubes are in place. Streaky bibasilar atelectasis but no edema, definite effusions or pulmonary edema. IMPRESSION: 1. Postoperative support apparatus in good position without complicating features. 2. Minimal streaky atelectasis but no edema, effusions or pneumothorax. Electronically Signed   By: Rudie Meyer M.D.   On: 12/23/2017 15:19    Cardiac  Studies      Echocardiogram: 12/22/2017-EF 40 to 45%  Patient Profile     62 y.o. male with  cardiac arrest occurring on exercise treadmill test/NUC in the outpatient setting successfully resuscitated with urgent cardiac catheterization resulting in severe triple-vessel coronary artery disease.  CABG.  Ischemic cardiomyopathy EF 40 to 45% noted.  Assessment & Plan    Severe CAD -CABG - Secondary prevention, aspirin, beta-blocker, high intensity statin-started atorvastatin 80 mg once a day, ACE/ARB/ or Entresto when able.  Obviously needs to be off of milrinone first.  Ischemic cardiomyopathy/acute systolic heart failure -EF 40 to 45%.  Once off milrinone, continue to utilize beta-blocker adding ACE/ARB or Entresto if possible -Continue diuresis, mobilization, milrinone - Out 2.1 L net.  Weight 84.8 kg, up from 78 on 12/22/17.   Post Cardiac arrest -Successful resuscitation, urgent catheterization, transient use of IV amiodarone, ischemic culprit.  Occurred during exercise treadmill test portion of nuclear stress test in the outpatient setting.  No changes.  Supportive care   For questions or updates, please contact [facility] Please consult [email or web address] for contact info under        Signed, Donato Schultz, MD  12/25/2017, 12:23 PM

## 2017-12-25 NOTE — Progress Notes (Signed)
2 Days Post-Op Procedure(s) (LRB): CORONARY ARTERY BYPASS GRAFTING (CABG) x Five , using left internal mammary artery and right leg greater saphenous vein harvested endoscopically (N/A) TRANSESOPHAGEAL ECHOCARDIOGRAM (TEE) (N/A) Subjective: Gastric dilatation w/ eructation ---start reglan nsr OOB to chair Lungs clear- DC chest tube Objective: Vital signs in last 24 hours: Temp:  [97.5 F (36.4 C)-98 F (36.7 C)] 98 F (36.7 C) (09/15 0400) Pulse Rate:  [63-94] 81 (09/15 0800) Cardiac Rhythm: Normal sinus rhythm (09/15 0835) Resp:  [12-31] 21 (09/15 0800) BP: (87-123)/(59-81) 118/81 (09/15 0800) SpO2:  [92 %-98 %] 95 % (09/15 0800) Arterial Line BP: (104-144)/(43-63) 122/52 (09/14 1430) Weight:  [84.8 kg] 84.8 kg (09/15 0640)  Hemodynamic parameters for last 24 hours:    Intake/Output from previous day: 09/14 0701 - 09/15 0700 In: 1462.6 [P.O.:480; I.V.:594.5; IV Piggyback:388.1] Out: 3605 [Urine:3275; Chest Tube:330] Intake/Output this shift: Total I/O In: 15.7 [I.V.:15.7] Out: -        Exam    General- alert and comfortable    Neck- no JVD, no cervical adenopathy palpable, no carotid bruit   Lungs- clear without rales, wheezes   Cor- regular rate and rhythm, no murmur , gallop   Abdomen- soft, non-tender   Extremities - warm, non-tender, minimal edema   Neuro- oriented, appropriate, no focal weakness   Lab Results: Recent Labs    12/24/17 1719 12/24/17 1747 12/25/17 0423  WBC 10.8*  --  14.0*  HGB 10.0* 9.5* 10.1*  HCT 31.2* 28.0* 31.2*  PLT 97*  --  95*   BMET:  Recent Labs    12/24/17 0612  12/24/17 1747 12/25/17 0423  NA 136  --  134* 131*  K 4.0  --  3.5 4.4  CL 105  --  94* 99  CO2 22  --   --  26  GLUCOSE 115*  --  176* 124*  BUN 12  --  17 18  CREATININE 0.85   < > 0.90 0.91  CALCIUM 8.2*  --   --  8.3*   < > = values in this interval not displayed.    PT/INR:  Recent Labs    12/23/17 1438  LABPROT 19.6*  INR 1.67   ABG     Component Value Date/Time   PHART 7.339 (L) 12/23/2017 1939   HCO3 21.5 12/23/2017 1939   TCO2 23 12/24/2017 1747   ACIDBASEDEF 4.0 (H) 12/23/2017 1939   O2SAT 99.0 12/23/2017 1939   CBG (last 3)  Recent Labs    12/24/17 2341 12/25/17 0347 12/25/17 0734  GLUCAP 117* 139* 120*    Assessment/Plan: S/P Procedure(s) (LRB): CORONARY ARTERY BYPASS GRAFTING (CABG) x Five , using left internal mammary artery and right leg greater saphenous vein harvested endoscopically (N/A) TRANSESOPHAGEAL ECHOCARDIOGRAM (TEE) (N/A) Mobilize Diuresis Diabetes control d/c tubes/lines   LOS: 3 days    Glenn [name] 12/25/2017

## 2017-12-26 ENCOUNTER — Inpatient Hospital Stay (HOSPITAL_COMMUNITY): Payer: 59

## 2017-12-26 ENCOUNTER — Encounter (HOSPITAL_COMMUNITY): Payer: Self-pay

## 2017-12-26 LAB — GLUCOSE, CAPILLARY
GLUCOSE-CAPILLARY: 100 mg/dL — AB (ref 70–99)
GLUCOSE-CAPILLARY: 139 mg/dL — AB (ref 70–99)
GLUCOSE-CAPILLARY: 97 mg/dL (ref 70–99)
GLUCOSE-CAPILLARY: 95 mg/dL (ref 70–99)
Glucose-Capillary: 89 mg/dL (ref 70–99)

## 2017-12-26 LAB — CBC
HCT: 30.2 % — ABNORMAL LOW (ref 39.0–52.0)
Hemoglobin: 10 g/dL — ABNORMAL LOW (ref 13.0–17.0)
MCH: 30.1 pg (ref 26.0–34.0)
MCHC: 33.1 g/dL (ref 30.0–36.0)
MCV: 91 fL (ref 78.0–100.0)
Platelets: 107 10*3/uL — ABNORMAL LOW (ref 150–400)
RBC: 3.32 MIL/uL — ABNORMAL LOW (ref 4.22–5.81)
RDW: 13.9 % (ref 11.5–15.5)
WBC: 13 10*3/uL — ABNORMAL HIGH (ref 4.0–10.5)

## 2017-12-26 LAB — BASIC METABOLIC PANEL
Anion gap: 6 (ref 5–15)
BUN: 22 mg/dL (ref 8–23)
CO2: 29 mmol/L (ref 22–32)
Calcium: 8.5 mg/dL — ABNORMAL LOW (ref 8.9–10.3)
Chloride: 99 mmol/L (ref 98–111)
Creatinine, Ser: 0.94 mg/dL (ref 0.61–1.24)
GFR calc Af Amer: >60 mL/min (ref >60)
GFR calc non Af Amer: >60 mL/min (ref >60)
Glucose, Bld: 111 mg/dL — ABNORMAL HIGH (ref 70–99)
Potassium: 4.7 mmol/L (ref 3.5–5.1)
Sodium: 134 mmol/L — ABNORMAL LOW (ref 135–145)

## 2017-12-26 MED ORDER — LISINOPRIL 2.5 MG PO TABS
2.5000 mg | ORAL_TABLET | Freq: Every day | ORAL | Status: DC
  Administered 2017-12-27 – 2017-12-28 (×2): 2.5 mg via ORAL
  Filled 2017-12-26 (×2): qty 1

## 2017-12-26 MED ORDER — SODIUM CHLORIDE 0.9% FLUSH
3.0000 mL | Freq: Two times a day (BID) | INTRAVENOUS | Status: DC
  Administered 2017-12-26 – 2017-12-28 (×3): 3 mL via INTRAVENOUS

## 2017-12-26 MED ORDER — INSULIN ASPART 100 UNIT/ML ~~LOC~~ SOLN
0.0000 [IU] | Freq: Three times a day (TID) | SUBCUTANEOUS | Status: DC
  Administered 2017-12-26: 2 [IU] via SUBCUTANEOUS

## 2017-12-26 MED ORDER — SODIUM CHLORIDE 0.9% FLUSH: 3.0000 mL | INTRAVENOUS | Status: DC | PRN

## 2017-12-26 MED ORDER — FUROSEMIDE 10 MG/ML IJ SOLN
40.0000 mg | Freq: Every day | INTRAMUSCULAR | Status: DC
  Administered 2017-12-26 – 2017-12-28 (×3): 40 mg via INTRAVENOUS
  Filled 2017-12-26 (×3): qty 4

## 2017-12-26 MED ORDER — SODIUM CHLORIDE 0.9 % IV SOLN: 250.0000 mL | INTRAVENOUS | Status: DC | PRN

## 2017-12-26 MED ORDER — MOVING RIGHT ALONG BOOK
Freq: Once | Status: AC
  Administered 2017-12-26: 09:00:00
  Filled 2017-12-26: qty 1

## 2017-12-26 MED FILL — Potassium Chloride Inj 2 mEq/ML: INTRAVENOUS | Qty: 40 | Status: AC

## 2017-12-26 MED FILL — Heparin Sodium (Porcine) Inj 1000 Unit/ML: INTRAMUSCULAR | Qty: 30 | Status: AC

## 2017-12-26 MED FILL — Magnesium Sulfate Inj 50%: INTRAMUSCULAR | Qty: 10 | Status: AC

## 2017-12-26 NOTE — Progress Notes (Signed)
3 Days Post-Op Procedure(s) (LRB): CORONARY ARTERY BYPASS GRAFTING (CABG) x Five , using left internal mammary artery and right leg greater saphenous vein harvested endoscopically (N/A) TRANSESOPHAGEAL ECHOCARDIOGRAM (TEE) (N/A) Subjective: Progressing after urgent multivessel CABG for cardiac arrest with left main and three-vessel CAD during stress test Patient transferred to stepdown, off inotropes Maintaining sinus rhythm Chest x-ray clear Ambulating in hallway Incisions healing   Objective: Vital signs in last 24 hours: Temp:  [97.4 F (36.3 C)-97.9 F (36.6 C)] 97.4 F (36.3 C) (09/16 1145) Pulse Rate:  [63-91] 87 (09/16 1643) Cardiac Rhythm: Normal sinus rhythm;Bundle branch block (09/16 1154) Resp:  [15-27] 21 (09/16 1643) BP: (89-127)/(57-93) 127/76 (09/16 1643) SpO2:  [91 %-100 %] 97 % (09/16 1145) Weight:  [81.3 kg] 81.3 kg (09/16 0630)  Hemodynamic parameters for last 24 hours:  Stable  Intake/Output from previous day: 09/15 0701 - 09/16 0700 In: 830 [P.O.:720; I.V.:110] Out: 3150 [Urine:3150] Intake/Output this shift: Total I/O In: 245.7 [P.O.:240; I.V.:5.7] Out: 650 [Urine:650]       Exam    General- alert and comfortable    Neck- no JVD, no cervical adenopathy palpable, no carotid bruit   Lungs- clear without rales, wheezes   Cor- regular rate and rhythm, no murmur , gallop   Abdomen- soft, non-tender   Extremities - warm, non-tender, minimal edema   Neuro- oriented, appropriate, no focal weakness   Lab Results: Recent Labs    12/25/17 0423 12/26/17 0241  WBC 14.0* 13.0*  HGB 10.1* 10.0*  HCT 31.2* 30.2*  PLT 95* 107*   BMET:  Recent Labs    12/25/17 0423 12/26/17 0241  NA 131* 134*  K 4.4 4.7  CL 99 99  CO2 26 29  GLUCOSE 124* 111*  BUN 18 22  CREATININE 0.91 0.94  CALCIUM 8.3* 8.5*    PT/INR: No results for input(s): LABPROT, INR in the last 72 hours. ABG    Component Value Date/Time   PHART 7.339 (L) 12/23/2017 1939   HCO3  21.5 12/23/2017 1939   TCO2 23 12/24/2017 1747   ACIDBASEDEF 4.0 (H) 12/23/2017 1939   O2SAT 99.0 12/23/2017 1939   CBG (last 3)  Recent Labs    12/26/17 0801 12/26/17 1216 12/26/17 1600  GLUCAP 139* 97 95    Assessment/Plan: S/P Procedure(s) (LRB): CORONARY ARTERY BYPASS GRAFTING (CABG) x Five , using left internal mammary artery and right leg greater saphenous vein harvested endoscopically (N/A) TRANSESOPHAGEAL ECHOCARDIOGRAM (TEE) (N/A) Mobilize Diuresis Plan for transfer to step-down: see transfer orders Remove pacing wires September 17, possible discharge home September 18 Add low-dose ACE inhibitor when blood pressure allows  LOS: 4 days    Glenn [name] 12/26/2017

## 2017-12-26 NOTE — Progress Notes (Signed)
CARDIAC REHAB PHASE I   PRE:  Rate/Rhythm: 87 SR with PACs    BP: sitting 114/73    SaO2: 100 RA  MODE:  Ambulation: 470 ft   POST:  Rate/Rhythm: 97 SR    BP: sitting 140/80     SaO2: 100 RA  Pt moving well. Able to stand and walk with just stand by assist. No c/o except lack of sleep. To bathroom then bed after walk. Hoping to get a nap as he hasn't slept. VSS.  [phone]   Harriet Masson CES, ACSM 12/26/2017 3:12 PM

## 2017-12-26 NOTE — Progress Notes (Signed)
1st attempt to call report. Receiving RN is in an emergency currently and will call when available per secretary.

## 2017-12-26 NOTE — Plan of Care (Signed)
  Problem: Education: Goal: Knowledge of General Education information will improve Description Including pain rating scale, medication(s)/side effects and non-pharmacologic comfort measures Outcome: Progressing   Problem: Health Behavior/Discharge Planning: Goal: Ability to manage health-related needs will improve Outcome: Progressing   Problem: Clinical Measurements: Goal: Ability to maintain clinical measurements within normal limits will improve Outcome: Progressing Goal: Will remain free from infection Outcome: Progressing Goal: Diagnostic test results will improve Outcome: Progressing Goal: Respiratory complications will improve Outcome: Progressing Goal: Cardiovascular complication will be avoided Outcome: Progressing   Problem: Activity: Goal: Risk for activity intolerance will decrease Outcome: Progressing   Problem: Nutrition: Goal: Adequate nutrition will be maintained Outcome: Progressing   Problem: Coping: Goal: Level of anxiety will decrease Outcome: Progressing   Problem: Elimination: Goal: Will not experience complications related to bowel motility Outcome: Progressing Goal: Will not experience complications related to urinary retention Outcome: Progressing   Problem: Pain Managment: Goal: General experience of comfort will improve Outcome: Progressing   Problem: Safety: Goal: Ability to remain free from injury will improve Outcome: Progressing   Problem: Skin Integrity: Goal: Risk for impaired skin integrity will decrease Outcome: Progressing   Problem: Activity: Goal: Ability to return to baseline activity level will improve Outcome: Progressing   Problem: Cardiovascular: Goal: Ability to achieve and maintain adequate cardiovascular perfusion will improve Outcome: Progressing Goal: Vascular access site(s) Level 0-1 will be maintained Outcome: Progressing   Problem: Education: Goal: Knowledge of disease or condition will  improve Outcome: Progressing Goal: Knowledge of the prescribed therapeutic regimen will improve Outcome: Progressing   Problem: Activity: Goal: Risk for activity intolerance will decrease Outcome: Progressing   Problem: Cardiac: Goal: Will achieve and/or maintain hemodynamic stability Outcome: Progressing   Problem: Clinical Measurements: Goal: Postoperative complications will be avoided or minimized Outcome: Progressing   Problem: Respiratory: Goal: Respiratory status will improve Outcome: Progressing   Problem: Skin Integrity: Goal: Wound healing without signs and symptoms of infection Outcome: Progressing Goal: Risk for impaired skin integrity will decrease Outcome: Progressing   Problem: Urinary Elimination: Goal: Ability to achieve and maintain adequate renal perfusion and functioning will improve Outcome: Progressing

## 2017-12-27 ENCOUNTER — Inpatient Hospital Stay (HOSPITAL_COMMUNITY): Payer: 59

## 2017-12-27 LAB — GLUCOSE, CAPILLARY
Glucose-Capillary: 80 mg/dL (ref 70–99)
Glucose-Capillary: 81 mg/dL (ref 70–99)
Glucose-Capillary: 85 mg/dL (ref 70–99)
Glucose-Capillary: 89 mg/dL (ref 70–99)

## 2017-12-27 LAB — BASIC METABOLIC PANEL
Anion gap: 5 (ref 5–15)
BUN: 22 mg/dL (ref 8–23)
CO2: 30 mmol/L (ref 22–32)
Calcium: 8.3 mg/dL — ABNORMAL LOW (ref 8.9–10.3)
Chloride: 102 mmol/L (ref 98–111)
Creatinine, Ser: 1.03 mg/dL (ref 0.61–1.24)
GFR calc Af Amer: >60 mL/min (ref >60)
GFR calc non Af Amer: >60 mL/min (ref >60)
Glucose, Bld: 97 mg/dL (ref 70–99)
Potassium: 3.9 mmol/L (ref 3.5–5.1)
Sodium: 137 mmol/L (ref 135–145)

## 2017-12-27 LAB — CBC
HCT: 28.8 % — ABNORMAL LOW (ref 39.0–52.0)
Hemoglobin: 9.6 g/dL — ABNORMAL LOW (ref 13.0–17.0)
MCH: 30.3 pg (ref 26.0–34.0)
MCHC: 33.3 g/dL (ref 30.0–36.0)
MCV: 90.9 fL (ref 78.0–100.0)
Platelets: 131 10*3/uL — ABNORMAL LOW (ref 150–400)
RBC: 3.17 MIL/uL — ABNORMAL LOW (ref 4.22–5.81)
RDW: 14.1 % (ref 11.5–15.5)
WBC: 7.5 10*3/uL (ref 4.0–10.5)

## 2017-12-27 NOTE — Discharge Summary (Signed)
Physician Discharge Summary  Patient ID: Glenn [name] MRN: [id number] DOB/AGE: 12/29/1955 62 y.o.  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 S/P CABG x 5 12/23/2017  . Cardiac arrest (HCC) 12/22/2017  . Accelerating angina (HCC) 12/22/2017   History of present illness:  The patient is a 62 year old male with cardiac risk factors including hyperlipidemia and hypertension who was recently seen by Dr. Purvis Sheffield on 12/20/2017 for chest pain and fatigue symptoms.  An exercise nuclear stress test was obtained and while on the treadmill the patient had findings of 4 mm downsloping ST depressions in the inferior and lateral leads.  Patient also noted a baseline EKG that had some inferior lateral T wave inversions.  Due to this the treadmill was stopped and after stopping the patient became suddenly unresponsive and was placed on the floor.  He was found to be in complete cardiac arrest and CPR was initiated.  This occurred for approximately 3 minutes and the rhythm then showed ventricular tachycardia.  He was promptly defibrillated with a single shock and reverted back to sinus rhythm.  The patient was then taken to the cardiac catheterization lab where he was found to have severe multivessel coronary artery disease.  Cardiothoracic surgical consultation was obtained and CABG was recommended.   Discharged Condition: good  Hospital Course: The patient was medically stabilized and on 12/23/2017 he was taken to the operating room where he underwent the below described procedure.  He tolerated it well and was taken to the surgical intensive care unit in stable condition.  Postoperative hospital course:  The patient is done quite  well.  Weaned from the ventilator without difficulty using standard protocols.  Initially did require some inotropic support but this was weaned without difficulty.  Routine lines, monitors and drainage devices have been discontinued in the standard fashion.  He does have an expected acute blood loss anemia and values have stabilized.  He has some postoperative volume overload but is responding well to diuretics.  He is tolerating gradually increasing activities using standard cardiac rehab phase 1 modalities.  Oxygen has been weaned and he maintains good saturations on room air.  Incisions are noted to be healing well without signs of infection.  He has maintained sinus rhythm with some PVCs.  He does have ischemic cardiomyopathy with ejection fraction in the 40 to 45% range.  He is on beta-blocker as well as ACE inhibitor.  He is on a statin.  He is on aspirin.  At time of discharge the patient is felt to be quite stable.  Consults: cardiology  Significant Diagnostic Studies: angiography: Cardiac catheterization and exercise nuclear stress test  Treatments: surgery:  OPERATIVE REPORT  DATE OF PROCEDURE:  12/23/2017  OPERATION: 1.  Coronary artery bypass grafting x5 (left internal mammary artery to left anterior descending, saphenous vein graft to diagonal, sequential saphenous vein graft to ramus intermedius and circumflex marginal, saphenous vein graft to posterior  descending. 2.  Endoscopic harvest of right leg greater saphenous vein.  SURGEON:  Mikey Bussing, MD  ASSISTANT:  Gershon Crane, PA-C  PREOPERATIVE DIAGNOSES: 1.  Severe left main and 3-vessel coronary artery disease with acute cardiac arrest during treadmill stress test. 2.  Moderate left ventricular dysfunction.  POSTOPERATIVE DIAGNOSES:   1.  Severe left main and 3-vessel coronary artery disease with acute cardiac arrest during treadmill stress test. 2.  Moderate left ventricular dysfunction.    Discharge  Exam: Blood pressure (!) 125/98, pulse (!) 57, temperature (!) 97.5 F (36.4 C), temperature source Oral, resp. rate 18, height 5\' 9"  (1.753 m), weight 77.5 kg, SpO2 100 %. General appearance: alert, cooperative and no distress Heart: regular rate and rhythm and brady Lungs: clear to auscultation bilaterally Abdomen: benign Extremities: no edema Wound: incis healing well   Disposition: Discharge disposition: 01-Home or Self Care     Discharged home in good condition  Discharge Instructions    Amb Referral to Cardiac Rehabilitation   Complete by:  As directed    Diagnosis:  CABG   CABG X ___:  5   Discharge patient   Complete by:  As directed    Discharge disposition:  01-Home or Self Care   Discharge patient date:  12/28/2017     Allergies as of 12/28/2017   No Known Allergies     Medication List    STOP taking these medications   losartan 25 MG tablet Commonly known as:  COZAAR     TAKE these medications   aspirin 325 MG EC tablet Take 1 tablet (325 mg total) by mouth every 6 (six) hours as needed. What changed:    medication strength  how much to take  when to take this  reasons to take this   atorvastatin 80 MG tablet Commonly known as:  LIPITOR Take 1 tablet (80 mg total) by mouth daily at 6 PM.   BENGAY GREASELESS EX Apply 1 application topically as needed (shoulder pain).   escitalopram 10 MG tablet Commonly known as:  LEXAPRO Take 10 mg by mouth daily.   furosemide 40 MG tablet Commonly known as:  LASIX Take 1 tablet (40 mg total) by mouth daily for 7 days.   lisinopril 2.5 MG tablet Commonly known as:  PRINIVIL,ZESTRIL Take 1 tablet (2.5 mg total) by mouth daily. Start taking on:  12/29/2017   metoprolol tartrate 25 MG tablet Commonly known as:  LOPRESSOR Take 0.5 tablets (12.5 mg total) by mouth 2 (two) times daily.   multivitamin-iron-minerals-folic acid chewable tablet Chew 1 tablet by mouth daily.   oxyCODONE 5 MG immediate  release tablet Commonly known as:  Oxy IR/ROXICODONE Take 1-2 tablets (5-10 mg total) by mouth every 6 (six) hours as needed for up to 7 days for severe pain.   ranitidine 75 MG tablet Commonly known as:  ZANTAC Take 75 mg by mouth as needed for heartburn.      Follow-up Information    Kerin Perna, MD Follow up.   Specialty:  Cardiothoracic Surgery Why:  Appointment to see the surgeon on 01/25/2018 at 11:30 AM.  Please obtain a chest x-ray at [facility] imaging at 11 AM.  It is located in the same office complex on the first floor. Contact information: [address] [place] [address] [phone]        Laqueta Linden, MD Follow up.   Specialty:  Cardiology Why:  Please see discharge paperwork for a 2-week cardiology follow-up appointment. Contact information: [address] [place] [address] [phone]          The patient has been discharged on:   1.Beta Blocker:  Yes [ y  ]  No   [   ]                              If No, reason:  2.Ace Inhibitor/ARB: Yes [place].Etienne   ]                                     No  [    ]                                     If No, reason:  3.Statin:   Yes [ y  ]                  No  [   ]                  If No, reason:  4.Ecasa:  Yes  [ y  ]                  No   [   ]                  If No, reason: Signed: Rowe Clack 12/28/2017, 9:10 AM Pager [phone]

## 2017-12-27 NOTE — Discharge Instructions (Signed)
Endoscopic Saphenous Vein Harvesting, Care After °Refer to this sheet in the next few weeks. These instructions provide you with information about caring for yourself after your procedure. Your health care provider may also give you more specific instructions. Your treatment has been planned according to current medical practices, but problems sometimes occur. Call your health care provider if you have any problems or questions after your procedure. °What can I expect after the procedure? °After the procedure, it is common to have: °· Pain. °· Bruising. °· Swelling. °· Numbness. ° °Follow these instructions at home: °Medicine °· Take over-the-counter and prescription medicines only as told by your health care provider. °· Do not drive or operate heavy machinery while taking prescription pain medicine. °Incision care ° °· Follow instructions from your health care provider about how to take care of the cut made during surgery (incision). Make sure you: °? Wash your hands with soap and water before you change your bandage (dressing). If soap and water are not available, use hand sanitizer. °? Change your dressing as told by your health care provider. °? Leave stitches (sutures), skin glue, or adhesive strips in place. These skin closures may need to be in place for 2 weeks or longer. If adhesive strip edges start to loosen and curl up, you may trim the loose edges. Do not remove adhesive strips completely unless your health care provider tells you to do that. °· Check your incision area every day for signs of infection. Check for: °? More redness, swelling, or pain. °? More fluid or blood. °? Warmth. °? Pus or a bad smell. °General instructions °· Raise (elevate) your legs above the level of your heart while you are sitting or lying down. °· Do any exercises your health care providers have given you. These may include deep breathing, coughing, and walking exercises. °· Do not shower, take baths, swim, or use a hot tub  unless told by your health care provider. °· Wear your elastic stocking if told by your health care provider. °· Keep all follow-up visits as told by your health care provider. This is important. °Contact a health care provider if: °· Medicine does not help your pain. °· Your pain gets worse. °· You have new leg bruises or your leg bruises get bigger. °· You have a fever. °· Your leg feels numb. °· You have more redness, swelling, or pain around your incision. °· You have more fluid or blood coming from your incision. °· Your incision feels warm to the touch. °· You have pus or a bad smell coming from your incision. °Get help right away if: °· Your pain is severe. °· You develop pain, tenderness, warmth, redness, or swelling in any part of your leg. °· You have chest pain. °· You have trouble breathing. °This information is not intended to replace advice given to you by your health care provider. Make sure you discuss any questions you have with your health care provider. °Document Released: 12/09/2010 Document Revised: 09/04/2015 Document Reviewed: 02/10/2015 °Elsevier Interactive Patient Education © 2018 [employer] °Coronary Artery Bypass Grafting, Care After °These instructions give you information on caring for yourself after your procedure. Your doctor may also give you more specific instructions. Call your doctor if you have any problems or questions after your procedure. °Follow these instructions at home: °· Only take medicine as told by your doctor. Take medicines exactly as told. Do not stop taking medicines or start any new medicines without talking to your doctor first. °·   Take your pulse as told by your doctor. °· Do deep breathing as told by your doctor. Use your breathing device (incentive spirometer), if given, to practice deep breathing several times a day. Support your chest with a pillow or your arms when you take deep breaths or cough. °· Keep the area clean, dry, and protected where the  surgery cuts (incisions) were made. Remove bandages (dressings) only as told by your doctor. If strips were applied to surgical area, do not take them off. They fall off on their own. °· Check the surgery area daily for puffiness (swelling), redness, or leaking fluid. °· If surgery cuts were made in your legs: °? Avoid crossing your legs. °? Avoid sitting for long periods of time. Change positions every 30 minutes. °? Raise your legs when you are sitting. Place them on pillows. °· Wear stockings that help keep blood clots from forming in your legs (compression stockings). °· Only take sponge baths until your doctor says it is okay to take showers. Pat the surgery area dry. Do not rub the surgery area with a washcloth or towel. Do not bathe, swim, or use a hot tub until your doctor says it is okay. °· Eat foods that are high in fiber. These include raw fruits and vegetables, whole grains, beans, and nuts. Choose lean meats. Avoid canned, processed, and fried foods. °· Drink enough fluids to keep your pee (urine) clear or pale yellow. °· Weigh yourself every day. °· Rest and limit activity as told by your doctor. You may be told to: °? Stop any activity if you have chest pain, shortness of breath, changes in heartbeat, or dizziness. Get help right away if this happens. °? Move around often for short amounts of time or take short walks as told by your doctor. Gradually become more active. You may need help to strengthen your muscles and build endurance. °? Avoid lifting, pushing, or pulling anything heavier than 10 pounds (4.5 kg) for at least 6 weeks after surgery. °· Do not drive until your doctor says it is okay. °· Ask your doctor when you can go back to work. °· Ask your doctor when you can begin sexual activity again. °· Follow up with your doctor as told. °Contact a doctor if: °· You have puffiness, redness, more pain, or fluid draining from the incision site. °· You have a fever. °· You have puffiness in your  ankles or legs. °· You have pain in your legs. °· You gain 2 or more pounds (0.9 kg) a day. °· You feel sick to your stomach (nauseous) or throw up (vomit). °· You have watery poop (diarrhea). °Get help right away if: °· You have chest pain that goes to your jaw or arms. °· You have shortness of breath. °· You have a fast or irregular heartbeat. °· You notice a "clicking" in your breastbone when you move. °· You have numbness or weakness in your arms or legs. °· You feel dizzy or light-headed. °This information is not intended to replace advice given to you by your health care provider. Make sure you discuss any questions you have with your health care provider. °Document Released: 04/03/2013 Document Revised: 09/04/2015 Document Reviewed: 09/05/2012 °Elsevier Interactive Patient Education © 2017 Elsevier Inc. ° °

## 2017-12-27 NOTE — Plan of Care (Signed)

## 2017-12-27 NOTE — Progress Notes (Addendum)
[address]       [place] [address]             [phone]      4 Days Post-Op Procedure(s) (LRB): CORONARY ARTERY BYPASS GRAFTING (CABG) x Five , using left internal mammary artery and right leg greater saphenous vein harvested endoscopically (N/A) TRANSESOPHAGEAL ECHOCARDIOGRAM (TEE) (N/A) Subjective: Feels pretty well. No specific c/o  Objective: Vital signs in last 24 hours: Temp:  [97.4 F (36.3 C)-98 F (36.7 C)] 98 F (36.7 C) (09/17 0428) Pulse Rate:  [61-91] 89 (09/17 0903) Cardiac Rhythm: Normal sinus rhythm;Bundle branch block (09/17 0700) Resp:  [19-27] 22 (09/17 0428) BP: (92-127)/(73-80) 122/76 (09/17 0903) SpO2:  [95 %-100 %] 100 % (09/17 0428) Weight:  [78.9 kg] 78.9 kg (09/17 0428)  Hemodynamic parameters for last 24 hours:    Intake/Output from previous day: 09/16 0701 - 09/17 0700 In: 245.7 [P.O.:240; I.V.:5.7] Out: 650 [Urine:650] Intake/Output this shift: Total I/O In: 240 [P.O.:240] Out: -   General appearance: alert, cooperative and no distress Heart: regular rate and rhythm and occ extrasystoles Lungs: mildly dim in bases Abdomen: benign Extremities: + LE edema Wound: incis healing well  Lab Results: Recent Labs    12/26/17 0241 12/27/17 0350  WBC 13.0* 7.5  HGB 10.0* 9.6*  HCT 30.2* 28.8*  PLT 107* 131*   BMET:  Recent Labs    12/26/17 0241 12/27/17 0350  NA 134* 137  K 4.7 3.9  CL 99 102  CO2 29 30  GLUCOSE 111* 97  BUN 22 22  CREATININE 0.94 1.03  CALCIUM 8.5* 8.3*    PT/INR: No results for input(s): LABPROT, INR in the last 72 hours. ABG    Component Value Date/Time   PHART 7.339 (L) 12/23/2017 1939   HCO3 21.5 12/23/2017 1939   TCO2 23 12/24/2017 1747   ACIDBASEDEF 4.0 (H) 12/23/2017 1939   O2SAT 99.0 12/23/2017 1939   CBG (last 3)  Recent Labs    12/26/17 1600 12/26/17 2114 12/27/17 0636  GLUCAP 95 89 80    Meds Scheduled Meds: . acetaminophen  1,000 mg Oral Q6H   Or  .  acetaminophen (TYLENOL) oral liquid 160 mg/5 mL  1,000 mg Per Tube Q6H  . aspirin EC  325 mg Oral Daily   Or  . aspirin  324 mg Per Tube Daily  . atorvastatin  80 mg Oral q1800  . bisacodyl  10 mg Oral Daily   Or  . bisacodyl  10 mg Rectal Daily  . docusate sodium  200 mg Oral Daily  . furosemide  40 mg Intravenous Daily  . insulin aspart  0-24 Units Subcutaneous TID AC & HS  . lisinopril  2.5 mg Oral Daily  . mouth rinse  15 mL Mouth Rinse BID  . metoCLOPramide (REGLAN) injection  10 mg Intravenous Q6H  . metoprolol tartrate  12.5 mg Oral BID   Or  . metoprolol tartrate  12.5 mg Per Tube BID  . pantoprazole  40 mg Oral Daily  . simethicone  80 mg Oral QID  . sodium chloride flush  3 mL Intravenous Q12H  . sodium chloride flush  3 mL Intravenous Q12H   Continuous Infusions: . sodium chloride Stopped (12/25/17 1032)  . sodium chloride     PRN Meds:.sodium chloride, magic mouthwash, metoprolol tartrate, ondansetron (ZOFRAN) IV, oxyCODONE, sodium chloride flush, sodium chloride flush, traMADol  Xrays Dg Chest 2 View  Result Date: 12/27/2017 CLINICAL DATA:  Prior CABG. EXAM: CHEST - 2 VIEW COMPARISON:  12/26/2017.  12/22/2017. FINDINGS: Mediastinum and hilar structures normal. Prior CABG. Cardiomegaly. No pulmonary venous congestion. Low lung volumes with mild bibasilar atelectasis. Small bilateral pleural effusions. No pneumothorax. No acute bony abnormality. IMPRESSION: 1.  Prior CABG.  Cardiomegaly with normal pulmonary vascularity. 2. Low lung volumes with mild bibasilar atelectasis. Small bilateral pleural effusions. Electronically Signed   By: Maisie Fus  Register   On: 12/27/2017 09:20   Dg Chest 2 View  Result Date: 12/26/2017 CLINICAL DATA:  Status post CABG chest x-ray EXAM: CHEST - 2 VIEW COMPARISON:  Chest x-ray the lungs. FINDINGS: The lungs are adequately inflated. The interstitial markings remain coarse. There is no pneumothorax nor significant pleural effusion. The cardiac  silhouette is enlarged. The pulmonary vascularity is less engorged and more distinct. There has been interval removal of the internal jugular Cordis sheath on the right and the left chest tube. IMPRESSION: Further interval improvement in the appearance of the chest. Persistent mild pulmonary interstitial edema. Subsegmental atelectasis at the left lung base. Trace bilateral pleural effusions. Electronically Signed   By: David  [place] M.D.   On: 12/26/2017 07:12    Assessment/Plan: S/P Procedure(s) (LRB): CORONARY ARTERY BYPASS GRAFTING (CABG) x Five , using left internal mammary artery and right leg greater saphenous vein harvested endoscopically (N/A) TRANSESOPHAGEAL ECHOCARDIOGRAM (TEE) (N/A)  1 conts to make excellent progress 2 hemodyn stable in sinus with some PVC's, EF 40-45 % 3 volume overload,small effus cont lasix 4 ABL anemia- H/H pretty stable, thrombocytopenia is improving 5 sugars controlled, HGB A1C is 5.6 6 renal fxn normal BUN/Creat 7 routine pulm toilet for atx 8 routine cardiac rehab 9 d/c epw's today 10 prob home in am  LOS: 5 days    Rowe Clack 12/27/2017 Pager [phone] patient examined and medical record reviewed,agree with above note. Kathlee Nations Trigt III 12/27/2017

## 2017-12-27 NOTE — Progress Notes (Addendum)
Notified by CCMD that patient had some trigeminy beats at 2149. Patient returned to [facility]. Asymptomatic. Will continue to monitor

## 2017-12-27 NOTE — Progress Notes (Signed)
Progress Note  Patient Name: Glenn [name] Date of Encounter: 12/27/2017  Primary Cardiologist: Prentice Docker, MD   Subjective   Chest "sore"; no dyspnea  Inpatient Medications    Scheduled Meds: . acetaminophen  1,000 mg Oral Q6H   Or  . acetaminophen (TYLENOL) oral liquid 160 mg/5 mL  1,000 mg Per Tube Q6H  . aspirin EC  325 mg Oral Daily   Or  . aspirin  324 mg Per Tube Daily  . atorvastatin  80 mg Oral q1800  . bisacodyl  10 mg Oral Daily   Or  . bisacodyl  10 mg Rectal Daily  . docusate sodium  200 mg Oral Daily  . furosemide  40 mg Intravenous Daily  . insulin aspart  0-24 Units Subcutaneous TID AC & HS  . lisinopril  2.5 mg Oral Daily  . mouth rinse  15 mL Mouth Rinse BID  . metoCLOPramide (REGLAN) injection  10 mg Intravenous Q6H  . metoprolol tartrate  12.5 mg Oral BID   Or  . metoprolol tartrate  12.5 mg Per Tube BID  . pantoprazole  40 mg Oral Daily  . simethicone  80 mg Oral QID  . sodium chloride flush  3 mL Intravenous Q12H  . sodium chloride flush  3 mL Intravenous Q12H   Continuous Infusions: . sodium chloride Stopped (12/25/17 1032)  . sodium chloride     PRN Meds: sodium chloride, magic mouthwash, metoprolol tartrate, ondansetron (ZOFRAN) IV, oxyCODONE, sodium chloride flush, sodium chloride flush, traMADol   Vital Signs    Vitals:   12/26/17 2000 12/26/17 2346 12/27/17 0428 12/27/17 0903  BP:  92/73 120/76 122/76  Pulse: 77 61 61 89  Resp: (!) 24 19 (!) 22   Temp: (!) 97.4 F (36.3 C) 97.9 F (36.6 C) 98 F (36.7 C)   TempSrc: Oral Oral Oral   SpO2: 99% 95% 100%   Weight:   78.9 kg   Height:        Intake/Output Summary (Last 24 hours) at 12/27/2017 0955 Last data filed at 12/27/2017 0850 Gross per 24 hour  Intake 240 ml  Output 250 ml  Net -10 ml   Filed Weights   12/25/17 0640 12/26/17 0630 12/27/17 0428  Weight: 84.8 kg 81.3 kg 78.9 kg    Telemetry    Sinus with PVCs - Personally Reviewed  Physical Exam   GEN:  No acute distress.   Neck: No JVD Cardiac: RRR, no murmurs, rubs, or gallops.  Respiratory:  Mildly diminished breath sounds bases.  S/P sternotomy GI: Soft, nontender, non-distended  MS: No edema Neuro:  Nonfocal  Psych: Normal affect   Labs    Chemistry Recent Labs  Lab 12/23/17 0520  12/25/17 0423 12/26/17 0241 12/27/17 0350  NA 140   < > 131* 134* 137  K 3.7   < > 4.4 4.7 3.9  CL 106   < > 99 99 102  CO2 25   < > 26 29 30   GLUCOSE 105*   < > 124* 111* 97  BUN 15   < > 18 22 22   CREATININE 1.05   < > 0.91 0.94 1.03  CALCIUM 9.0   < > 8.3* 8.5* 8.3*  PROT 5.9*  --   --   --   --   ALBUMIN 3.9  --   --   --   --   AST 24  --   --   --   --  ALT 24  --   --   --   --   ALKPHOS 48  --   --   --   --   BILITOT 0.6  --   --   --   --   GFRNONAA >60   < > >60 >60 >60  GFRAA >60   < > >60 >60 >60  ANIONGAP 9   < > 6 6 5    < > = values in this interval not displayed.     Hematology Recent Labs  Lab 12/25/17 0423 12/26/17 0241 12/27/17 0350  WBC 14.0* 13.0* 7.5  RBC 3.38* 3.32* 3.17*  HGB 10.1* 10.0* 9.6*  HCT 31.2* 30.2* 28.8*  MCV 92.3 91.0 90.9  MCH 29.9 30.1 30.3  MCHC 32.4 33.1 33.3  RDW 14.1 13.9 14.1  PLT 95* 107* 131*    Cardiac Enzymes Recent Labs  Lab 12/22/17 0956  TROPONINI 0.03*    Radiology    Dg Chest 2 View  Result Date: 12/27/2017 CLINICAL DATA:  Prior CABG. EXAM: CHEST - 2 VIEW COMPARISON:  12/26/2017.  12/22/2017. FINDINGS: Mediastinum and hilar structures normal. Prior CABG. Cardiomegaly. No pulmonary venous congestion. Low lung volumes with mild bibasilar atelectasis. Small bilateral pleural effusions. No pneumothorax. No acute bony abnormality. IMPRESSION: 1.  Prior CABG.  Cardiomegaly with normal pulmonary vascularity. 2. Low lung volumes with mild bibasilar atelectasis. Small bilateral pleural effusions. Electronically Signed   By: Maisie Fus  Register   On: 12/27/2017 09:20   Dg Chest 2 View  Result Date: 12/26/2017 CLINICAL DATA:   Status post CABG chest x-ray EXAM: CHEST - 2 VIEW COMPARISON:  Chest x-ray the lungs. FINDINGS: The lungs are adequately inflated. The interstitial markings remain coarse. There is no pneumothorax nor significant pleural effusion. The cardiac silhouette is enlarged. The pulmonary vascularity is less engorged and more distinct. There has been interval removal of the internal jugular Cordis sheath on the right and the left chest tube. IMPRESSION: Further interval improvement in the appearance of the chest. Persistent mild pulmonary interstitial edema. Subsegmental atelectasis at the left lung base. Trace bilateral pleural effusions. Electronically Signed   By: David  [place] M.D.   On: 12/26/2017 07:12    Patient Profile     62 y.o. male with cardiac arrest occurring on exercise treadmill test/NUC in the outpatient setting successfully resuscitated with urgent cardiac catheterization resulting in severe triple-vessel coronary artery disease.  CABG.  Ischemic cardiomyopathy EF 40 to 45% noted.  Assessment & Plan    1 coronary artery disease-status post coronary artery bypass and graft.  Doing well.  Continue aspirin and statin.  2 ischemic cardiomyopathy-ejection fraction 40 to 45%.  Continue metoprolol and lisinopril.  Would plan to repeat echocardiogram 3 months following discharge to reassess.  3 carotid artery disease-patient will need follow-up carotid Dopplers September 2020.  4 hyperlipidemia-continue statin.  Will need lipids and liver 4 to 6 weeks following discharge.  5 hypertension-blood pressure is controlled.  Continue present medications.  CARDIOLOGY RECOMMENDATIONS:  Discharge is anticipated in the next 48 hours. Recommendations for medications and follow up:  Discharge Medications: Continue medications as they are currently listed in the [facility].  Follow Up: The patient's Primary Cardiologist is Prentice Docker, MD  Follow up in the [place] office in 2-4 week(s).  Signed,   Olga Millers, MD  10:06 AM 12/27/2017  [facility]  For questions or updates, please contact [facility] Please consult [email or web address] for contact info under  Signed, Olga Millers, MD  12/27/2017, 9:55 AM

## 2017-12-27 NOTE — Progress Notes (Signed)
Pt has been ambulating independently without problems. Ed completed with pt and wife. Good reception. Will refer to [facility] CRPII. He was already cognizant of his diet and ex. [phone] Ethelda Chick CES, ACSM 11:55 AM 12/27/2017

## 2017-12-27 NOTE — Plan of Care (Signed)
  Problem: Education: Goal: Knowledge of General Education information will improve Description Including pain rating scale, medication(s)/side effects and non-pharmacologic comfort measures Outcome: Progressing   Problem: Health Behavior/Discharge Planning: Goal: Ability to manage health-related needs will improve Outcome: Progressing   Problem: Clinical Measurements: Goal: Ability to maintain clinical measurements within normal limits will improve Outcome: Progressing Goal: Will remain free from infection Outcome: Progressing Goal: Respiratory complications will improve Outcome: Progressing Goal: Cardiovascular complication will be avoided Outcome: Progressing   Problem: Nutrition: Goal: Adequate nutrition will be maintained Outcome: Progressing   Problem: Pain Managment: Goal: General experience of comfort will improve Outcome: Progressing   Problem: Safety: Goal: Ability to remain free from injury will improve Outcome: Progressing   Problem: Elimination: Goal: Will not experience complications related to bowel motility Outcome: Completed/Met Goal: Will not experience complications related to urinary retention Outcome: Completed/Met

## 2017-12-28 LAB — GLUCOSE, CAPILLARY: Glucose-Capillary: 89 mg/dL (ref 70–99)

## 2017-12-28 MED ORDER — METOPROLOL TARTRATE 25 MG PO TABS: 12.5000 mg | ORAL_TABLET | Freq: Two times a day (BID) | ORAL | 1 refills | Status: DC

## 2017-12-28 MED ORDER — ATORVASTATIN CALCIUM 80 MG PO TABS: 80.0000 mg | ORAL_TABLET | Freq: Every day | ORAL | 1 refills | Status: DC

## 2017-12-28 MED ORDER — LISINOPRIL 2.5 MG PO TABS: 2.5000 mg | ORAL_TABLET | Freq: Every day | ORAL | 1 refills | Status: DC

## 2017-12-28 MED ORDER — ASPIRIN 325 MG PO TBEC: 325.0000 mg | DELAYED_RELEASE_TABLET | Freq: Four times a day (QID) | ORAL | Status: DC | PRN

## 2017-12-28 MED ORDER — FUROSEMIDE 40 MG PO TABS: 40.0000 mg | ORAL_TABLET | Freq: Every day | ORAL | 0 refills | Status: DC

## 2017-12-28 MED ORDER — OXYCODONE HCL 5 MG PO TABS: 5.0000 mg | ORAL_TABLET | Freq: Four times a day (QID) | ORAL | 0 refills | Status: AC | PRN

## 2017-12-28 MED FILL — Sodium Chloride IV Soln 0.9%: INTRAVENOUS | Qty: 2000 | Status: AC

## 2017-12-28 MED FILL — Electrolyte-R (PH 7.4) Solution: INTRAVENOUS | Qty: 3000 | Status: AC

## 2017-12-28 MED FILL — Sodium Bicarbonate IV Soln 8.4%: INTRAVENOUS | Qty: 50 | Status: AC

## 2017-12-28 MED FILL — Lidocaine HCl(Cardiac) IV PF Soln Pref Syr 100 MG/5ML (2%): INTRAVENOUS | Qty: 5 | Status: AC

## 2017-12-28 MED FILL — Albumin, Human Inj 5%: INTRAVENOUS | Qty: 250 | Status: AC

## 2017-12-28 MED FILL — Heparin Sodium (Porcine) Inj 1000 Unit/ML: INTRAMUSCULAR | Qty: 20 | Status: AC

## 2017-12-28 MED FILL — Mannitol IV Soln 20%: INTRAVENOUS | Qty: 500 | Status: AC

## 2017-12-28 NOTE — Progress Notes (Signed)
[address]       [place] [address]             [phone]      5 Days Post-Op Procedure(s) (LRB): CORONARY ARTERY BYPASS GRAFTING (CABG) x Five , using left internal mammary artery and right leg greater saphenous vein harvested endoscopically (N/A) TRANSESOPHAGEAL ECHOCARDIOGRAM (TEE) (N/A) Subjective: Feels well  Objective: Vital signs in last 24 hours: Temp:  [97.4 F (36.3 C)-97.5 F (36.4 C)] 97.5 F (36.4 C) (09/18 0422) Pulse Rate:  [53-89] 57 (09/18 0422) Cardiac Rhythm: Sinus bradycardia (09/18 0700) Resp:  [11-20] 18 (09/18 0549) BP: (111-140)/(68-98) 125/98 (09/18 0422) SpO2:  [98 %-100 %] 100 % (09/18 0422) Weight:  [77.5 kg] 77.5 kg (09/18 0549)  Hemodynamic parameters for last 24 hours:    Intake/Output from previous day: 09/17 0701 - 09/18 0700 In: 600 [P.O.:600] Out: 1800 [Urine:1800] Intake/Output this shift: No intake/output data recorded.  General appearance: alert, cooperative and no distress Heart: regular rate and rhythm and brady Lungs: clear to auscultation bilaterally Abdomen: benign Extremities: no edema Wound: incis healing well  Lab Results: Recent Labs    12/26/17 0241 12/27/17 0350  WBC 13.0* 7.5  HGB 10.0* 9.6*  HCT 30.2* 28.8*  PLT 107* 131*   BMET:  Recent Labs    12/26/17 0241 12/27/17 0350  NA 134* 137  K 4.7 3.9  CL 99 102  CO2 29 30  GLUCOSE 111* 97  BUN 22 22  CREATININE 0.94 1.03  CALCIUM 8.5* 8.3*    PT/INR: No results for input(s): LABPROT, INR in the last 72 hours. ABG    Component Value Date/Time   PHART 7.339 (L) 12/23/2017 1939   HCO3 21.5 12/23/2017 1939   TCO2 23 12/24/2017 1747   ACIDBASEDEF 4.0 (H) 12/23/2017 1939   O2SAT 99.0 12/23/2017 1939   CBG (last 3)  Recent Labs    12/27/17 1633 12/27/17 2052 12/28/17 0600  GLUCAP 85 89 89    Meds Scheduled Meds: . acetaminophen  1,000 mg Oral Q6H   Or  . acetaminophen (TYLENOL) oral liquid 160 mg/5 mL  1,000 mg  Per Tube Q6H  . aspirin EC  325 mg Oral Daily   Or  . aspirin  324 mg Per Tube Daily  . atorvastatin  80 mg Oral q1800  . bisacodyl  10 mg Oral Daily   Or  . bisacodyl  10 mg Rectal Daily  . docusate sodium  200 mg Oral Daily  . furosemide  40 mg Intravenous Daily  . insulin aspart  0-24 Units Subcutaneous TID AC & HS  . lisinopril  2.5 mg Oral Daily  . mouth rinse  15 mL Mouth Rinse BID  . metoCLOPramide (REGLAN) injection  10 mg Intravenous Q6H  . metoprolol tartrate  12.5 mg Oral BID   Or  . metoprolol tartrate  12.5 mg Per Tube BID  . pantoprazole  40 mg Oral Daily  . simethicone  80 mg Oral QID  . sodium chloride flush  3 mL Intravenous Q12H  . sodium chloride flush  3 mL Intravenous Q12H   Continuous Infusions: . sodium chloride Stopped (12/25/17 1032)  . sodium chloride     PRN Meds:.sodium chloride, magic mouthwash, metoprolol tartrate, ondansetron (ZOFRAN) IV, oxyCODONE, sodium chloride flush, sodium chloride flush, traMADol  Xrays Dg Chest 2 View  Result Date: 12/27/2017 CLINICAL DATA:  Prior CABG. EXAM: CHEST - 2 VIEW COMPARISON:  12/26/2017.  12/22/2017. FINDINGS:  Mediastinum and hilar structures normal. Prior CABG. Cardiomegaly. No pulmonary venous congestion. Low lung volumes with mild bibasilar atelectasis. Small bilateral pleural effusions. No pneumothorax. No acute bony abnormality. IMPRESSION: 1.  Prior CABG.  Cardiomegaly with normal pulmonary vascularity. 2. Low lung volumes with mild bibasilar atelectasis. Small bilateral pleural effusions. Electronically Signed   By: Maisie Fus  Register   On: 12/27/2017 09:20    Assessment/Plan: S/P Procedure(s) (LRB): CORONARY ARTERY BYPASS GRAFTING (CABG) x Five , using left internal mammary artery and right leg greater saphenous vein harvested endoscopically (N/A) TRANSESOPHAGEAL ECHOCARDIOGRAM (TEE) (N/A)  1 conts to do very well, some sinus brady but tol well, on lopressor 12.5mg  bid. BP is well controlled, less PVC's 2  soing well with rehab 3 no new labs/CXR 4 some edema- will d/c on short term lasix/K+ 5 d/c home today   LOS: 6 days    Rowe Clack 12/28/2017 Pager [phone]

## 2017-12-28 NOTE — Progress Notes (Signed)
CARDIAC REHAB PHASE I   Reinforced d/c ed with pt and wife. No further questions or concerns. Pt anxiously awaiting d/c.  [phone] Reynold Bowen, RN BSN 12/28/2017 9:21 AM

## 2017-12-29 DIAGNOSIS — Z736 Limitation of activities due to disability: Secondary | ICD-10-CM

## 2018-01-11 ENCOUNTER — Ambulatory Visit: Payer: 59 | Admitting: Adult Health

## 2018-01-11 ENCOUNTER — Encounter: Payer: Self-pay | Admitting: Student

## 2018-01-11 ENCOUNTER — Ambulatory Visit (INDEPENDENT_AMBULATORY_CARE_PROVIDER_SITE_OTHER): Payer: 59 | Admitting: Student

## 2018-01-11 VITALS — BP 110/62 | HR 57 | Ht 69.0 in | Wt 169.6 lb

## 2018-01-11 DIAGNOSIS — E785 Hyperlipidemia, unspecified: Secondary | ICD-10-CM

## 2018-01-11 DIAGNOSIS — I10 Essential (primary) hypertension: Secondary | ICD-10-CM | POA: Diagnosis not present

## 2018-01-11 DIAGNOSIS — I6523 Occlusion and stenosis of bilateral carotid arteries: Secondary | ICD-10-CM

## 2018-01-11 DIAGNOSIS — Z951 Presence of aortocoronary bypass graft: Secondary | ICD-10-CM | POA: Diagnosis not present

## 2018-01-11 DIAGNOSIS — I2510 Atherosclerotic heart disease of native coronary artery without angina pectoris: Secondary | ICD-10-CM

## 2018-01-11 DIAGNOSIS — I255 Ischemic cardiomyopathy: Secondary | ICD-10-CM | POA: Diagnosis not present

## 2018-01-11 MED ORDER — ASPIRIN 325 MG PO TBEC: 325.0000 mg | DELAYED_RELEASE_TABLET | Freq: Every day | ORAL | Status: DC

## 2018-01-11 NOTE — Patient Instructions (Signed)
Medication Instructions:  TAKE ASPIRIN 325 DAILY   Labwork: NONE  Testing/Procedures: Your physician has requested that you have an echocardiogram. Echocardiography is a painless test that uses sound waves to create images of your heart. It provides your doctor with information about the size and shape of your heart and how well your heart's chambers and valves are working. This procedure takes approximately one hour. There are no restrictions for this procedure.    Follow-Up: Your physician recommends that you schedule a follow-up appointment in: 2 MONTHS WITH DR. Purvis Sheffield- AFTER ECHO    Any Other Special Instructions Will Be Listed Below (If Applicable).     If you need a refill on your cardiac medications before your next appointment, please call your pharmacy.

## 2018-01-11 NOTE — Progress Notes (Signed)
Cardiology Office Note    Date:  01/11/2018   ID:  Glenn [name], DOB Nov 14, 1955, MRN [id number]  PCP:  Benita Stabile, MD  Cardiologist: Prentice Docker, MD    Chief Complaint  Patient presents with  . Hospitalization Follow-up    History of Present Illness:    Glenn [name] is a 62 y.o. male with past medical history of HTN and HLD who presents to the office today for hospital follow-up.  He was examined by Dr. Purvis Sheffield as a new patient referral on 12/20/2017 for worsening fatigue and abnormal EKG. Exercise Myoview was ordered for ischemic evaluation. He presented for testing on 12/22/2017 and while walking on the treadmill he developed ST depression and then developed dizziness before becoming unresponsive and found to be in VT. CPR was initiated and he was defibrillated with a single shock back to normal sinus rhythm and transferred to [facility] ED with subsequent transfer to [facility] for a cardiac catheterization. This was performed by Dr. Okey Dupre and showed critical multivessel CAD with 90% LM stenosis, 50 to 60% LAD stenosis, 70 to 80% LCx stenosis, and 70 to 99% RCA stenosis with the RPDA filled by collaterals. He underwent CABGx5 by Dr. Donata Clay on 12/23/2017 with LIMA-LAD, SVG-D1, Seq-RI-LCx, and SVG-PDA.  He was appropriately weaned from ventilator support and inotropes following surgery. Maintained NSR on telemetry with occasional PVC's but no significant arrhythmias. Did develop post-operative anemia and Hgb was stable at 9.6 when last checked on 12/27/2017. Follow-up echocardiogram showed his EF was reduced to 40 to 45%, therefore he was started on Lopressor 12.5 mg twice daily and Lisinopril 2.5 mg daily prior to discharge. Was also started on ASA 325 mg daily (listed on AVS as 325mg  Q6H PRN) along with Atorvastatin 80 mg daily.  In talking with the patient and his wife today, he reports significant improvement in his fatigue since undergoing CABG. He does experience sternal  discomfort but notices improvement with this on a daily basis. No association with exertion. He has been walking for up to 30-minute intervals each day and denies any exertional pain or dyspnea with these activities. No recent orthopnea, PND, lower extremity edema, or palpitations.  He has not checked his blood pressure at home but it is well-controlled at 110/62 during today's visit. Was previously having issues with hypotension during admission and does note occasional episodes of dizziness after taking his medications. No recent melena, hematochezia, or hematuria.    Past Medical History:  Diagnosis Date  . CAD (coronary artery disease)    a. 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 Essential hypertension   . Hyperlipidemia   . Impaired fasting glucose   . Other fatigue   . Palpitations     Past Surgical History:  Procedure Laterality Date  . CORONARY ARTERY BYPASS GRAFT N/A 12/23/2017   Procedure: CORONARY ARTERY BYPASS GRAFTING (CABG) x Five , using left internal mammary artery and right leg greater saphenous vein harvested endoscopically;  Surgeon: Kerin Perna, MD;  Location: [facility] OR;  Service: Open Heart Surgery;  Laterality: N/A;  . LEFT HEART CATH AND CORONARY ANGIOGRAPHY N/A 12/22/2017   Procedure: LEFT HEART CATH AND CORONARY ANGIOGRAPHY;  Surgeon: Yvonne Kendall, MD;  Location: [facility] INVASIVE CV LAB;  Service: Cardiovascular;  Laterality: N/A;  . SHOULDER ACROMIOPLASTY  07/01/2017  . TEE WITHOUT  CARDIOVERSION N/A 12/23/2017   Procedure: TRANSESOPHAGEAL ECHOCARDIOGRAM (TEE);  Surgeon: Donata Clay, Theron Arista, MD;  Location: [facility] OR;  Service: Open Heart Surgery;  Laterality: N/A;    Current Medications: Outpatient Medications Prior to Visit  Medication Sig Dispense Refill  . atorvastatin (LIPITOR) 80 MG tablet Take 1 tablet (80 mg  total) by mouth daily at 6 PM. 30 tablet 1  . lisinopril (PRINIVIL,ZESTRIL) 2.5 MG tablet Take 1 tablet (2.5 mg total) by mouth daily. 30 tablet 1  . Menthol-Methyl Salicylate (BENGAY GREASELESS EX) Apply 1 application topically as needed (shoulder pain).    . metoprolol tartrate (LOPRESSOR) 25 MG tablet Take 0.5 tablets (12.5 mg total) by mouth 2 (two) times daily. 30 tablet 1  . multivitamin-iron-minerals-folic acid (CENTRUM) chewable tablet Chew 1 tablet by mouth daily.    Marland Kitchen aspirin EC 325 MG EC tablet Take 1 tablet (325 mg total) by mouth every 6 (six) hours as needed.    Marland Kitchen escitalopram (LEXAPRO) 10 MG tablet Take 10 mg by mouth daily.    . furosemide (LASIX) 40 MG tablet Take 1 tablet (40 mg total) by mouth daily for 7 days. 7 tablet 0  . ranitidine (ZANTAC) 75 MG tablet Take 75 mg by mouth as needed for heartburn.      No facility-administered medications prior to visit.      Allergies:   Patient has no known allergies.   Social History   Socioeconomic History  . Marital status: Married    Spouse name: Not on file  . Number of children: Not on file  . Years of education: Not on file  . Highest education level: Not on file  Occupational History  . Not on file  Social Needs  . Financial resource strain: Not on file  . Food insecurity:    Worry: Not on file    Inability: Not on file  . Transportation needs:    Medical: Not on file    Non-medical: Not on file  Tobacco Use  . Smoking status: Former Games developer  . Smokeless tobacco: Never Used  Substance and Sexual Activity  . Alcohol use: Yes    Comment: on occasion  . Drug use: No  . Sexual activity: Not on file  Lifestyle  . Physical activity:    Days per week: Not on file    Minutes per session: Not on file  . Stress: Not on file  Relationships  . Social connections:    Talks on phone: Not on file    Gets together: Not on file    Attends religious service: Not on file    Active member of club or organization: Not on  file    Attends meetings of clubs or organizations: Not on file    Relationship status: Not on file  Other Topics Concern  . Not on file  Social History Narrative  . Not on file     Family History:  The patient's family history includes CAD (age of onset: 24) in his brother; Cancer in his father; Diabetes in his mother; Heart disease in his brother and mother; Hypertension in his mother.   Review of Systems:   Please see the history of present illness.     General:  No chills, fever, night sweats or weight changes.  Cardiovascular:  No dyspnea on exertion, edema, orthopnea, palpitations, paroxysmal nocturnal dyspnea. Positive for sternal chest pain.  Dermatological: No rash, lesions/masses Respiratory: No cough, dyspnea Urologic: No hematuria, dysuria Abdominal:   No nausea, vomiting,  diarrhea, bright red blood per rectum, melena, or hematemesis Neurologic:  No visual changes, wkns, changes in mental status. All other systems reviewed and are otherwise negative except as noted above.   Physical Exam:    VS:  BP 110/62   Pulse (!) 57   Ht 5\' 9"  (1.753 m)   Wt 169 lb 9.6 oz (76.9 kg)   SpO2 98%   BMI 25.05 kg/m    General: Well developed, well nourished Caucasian male appearing in no acute distress. Head: Normocephalic, atraumatic, sclera non-icteric, no xanthomas, nares are without discharge.  Neck: No carotid bruits. JVD not elevated.  Lungs: Respirations regular and unlabored, without wheezes or rales.  Heart: Regular rate and rhythm. No S3 or S4.  No murmur, no rubs, or gallops appreciated. Sternal incision appears well-healing with no erythema or drainage noted.  Abdomen: Soft, non-tender, non-distended with normoactive bowel sounds. No hepatomegaly. No rebound/guarding. No obvious abdominal masses. Msk:  Strength and tone appear normal for age. No joint deformities or effusions. Extremities: No clubbing or cyanosis. No lower extremity edema.  Distal pedal pulses are 2+  bilaterally. Vein graft harvest sites without erythema or drainage.  Neuro: Alert and oriented X 3. Moves all extremities spontaneously. No focal deficits noted. Psych:  Responds to questions appropriately with a normal affect. Skin: No rashes or lesions noted  Wt Readings from Last 3 Encounters:  01/11/18 169 lb 9.6 oz (76.9 kg)  12/28/17 170 lb 13.7 oz (77.5 kg)  12/20/17 174 lb (78.9 kg)     Studies/Labs Reviewed:   EKG:  EKG is ordered today. The ekg ordered today demonstrates NSR, HR 65, with mild TWI along inferior and lateral leads (improved when compared to prior tracings).   Recent Labs: 12/23/2017: ALT 24 12/24/2017: Magnesium 2.0 12/27/2017: BUN 22; Creatinine, Ser 1.03; Hemoglobin 9.6; Platelets 131; Potassium 3.9; Sodium 137   Lipid Panel    Component Value Date/Time   CHOL 196 12/23/2017 0520   TRIG 60 12/23/2017 0520   HDL 48 12/23/2017 0520   CHOLHDL 4.1 12/23/2017 0520   VLDL 12 12/23/2017 0520   LDLCALC 136 (H) 12/23/2017 0520    Additional studies/ records that were reviewed today include:   Cardiac Catheterization: 12/22/2017 Conclusions: 1. Critical multivessel CAD, as detailed below. 2. Severely reduced left ventricular systolic function with global hypokinesis and basal/mid inferior akinesis.  LVEF 30-35%. 3. Normal left ventricular filling pressure.  Recommendations: 1. Admit to cardiac ICU for urgent cardiac surgery consultation for CABG. 2. Initiate heparin infusion 2 hours after TR band deflation. 3. Aggressive secondary prevention. 4. Obtain transthoracic echocardiogram today.  Recommend uninterrupted dual antiplatelet therapy with Aspirin 81mg  daily and Clopidogrel 75mg  daily for a minimum of 12 months (ACS - Class I recommendation).  Initiation of clopidogrel can be deferred until after CABG.  Echocardiogram: 12/2017 Study Conclusions  - Left ventricle: The cavity size was at the upper limits of   normal. Wall thickness was normal.  Systolic function was mildly   to moderately reduced. The estimated ejection fraction was in the   range of 40% to 45%. Hypokinesis of the basal-midinferior and   inferoseptal myocardium; consistent with ischemia or infarction   in the distribution of the right coronary artery. - Left atrium: The atrium was mildly dilated.  Assessment:    1. Coronary artery disease involving native coronary artery of native heart without angina pectoris   2. S/P CABG x 5   3. Ischemic cardiomyopathy   4. Essential hypertension  5. Hyperlipidemia LDL goal <70   6. Bilateral carotid artery stenosis      Plan:   In order of problems listed above:  1. CAD/ VT Arrest  - the patient recently had a VT arrest during stress testing and was found to have critical multivessel CAD by catheterization with 90% LM stenosis, 50 to 60% LAD stenosis, 70 to 80% LCx stenosis, and 70 to 99% RCA stenosis with the RPDA filled by collaterals. Underwent CABGx5 on 12/23/2017 with LIMA-LAD, SVG-D1, Seq-RI-LCx, and SVG-PDA.   - he has overall progressed well since hospital discharge and has noticed improvement in his fatigue and dyspnea. Sternal and vein graft harvest sites appear well-healing. Sternal precautions reviewed and he has follow-up with Dr. Donata Clay in 2 weeks.  - he has been taking a daily ASA but is unsure of the dosing. Was encouraged to verify he is taking 325mg  daily. It was listed in his catheterization report to be on DAPT following CABG but this was not mentioned by General Cardiology or CT Surgery following CABG. Will message Dr. Donata Clay today to see if he wishes for the patient to remain on ASA alone or transition to Plavix 75mg  daily and ASA 81mg  daily. Continue statin and BB therapy.    2. Ischemic Cardiomyopathy - EF reduced to 40 to 45% by echocardiogram during recent admission. He denies any recent dyspnea on exertion, orthopnea, PND, or lower extremity edema. Appears euvolemic by examination today. -  reviewed the importance of following daily weights and monitoring sodium intake.  - he was started on Lopressor 12.5mg  BID and Lisinopril 2.5mg  daily during admission and unfortunately his BP does not offer much room to titrate this as SBP was previously staying in the 90's and he reports episodic dizziness. I encouraged him to purchase a BP cuff and follow this at home. - will plan for a repeat echocardiogram once 3 months out from revascularization to reassess EF. Pending results and BP response, can switch Lopressor to Toprol-XL and/or titrate Lisinopril. Not sure his BP would allow for the addition of Entresto but having readings available from the ambulatory setting would assist with this.   3. HTN - BP is well-controlled at 110/62 during today's visit. He does report intermittent dizziness at home but does not have a blood pressure cuff. He does plan to purchase one within the next several days. Was encouraged to do so as he experienced issues with hypotension during admission.  - remains on Lisinopril 2.5mg  daily and Lopressor 12.5mg  BID.   4. HLD - FLP during admission showed total cholesterol of 196, HDL 48, and LDL 136. Started on Atorvastatin 80mg  daily during admission and he denies any noted side-effects to this. Has scheduled fasting labs with his PCP in 02/2018 which will be 6-8 weeks out from initiation of statin therapy. Will request these records once available.   5. Carotid Artery Stenosis - dopplers in 12/2017 showed 40-59% RICA stenosis and 1-39% LICA stenosis. Will need repeat doppler studies in 12/2018.   Medication Adjustments/Labs and Tests Ordered: Current medicines are reviewed at length with the patient today.  Concerns regarding medicines are outlined above.  Medication changes, Labs and Tests ordered today are listed in the Patient Instructions below. Patient Instructions  Medication Instructions:  TAKE ASPIRIN 325 DAILY   Labwork: NONE  Testing/Procedures: Your  physician has requested that you have an echocardiogram. Echocardiography is a painless test that uses sound waves to create images of your heart. It provides your doctor  with information about the size and shape of your heart and how well your heart's chambers and valves are working. This procedure takes approximately one hour. There are no restrictions for this procedure.  Follow-Up: Your physician recommends that you schedule a follow-up appointment in: 2 MONTHS WITH DR. Purvis Sheffield- AFTER ECHO   Any Other Special Instructions Will Be Listed Below (If Applicable).  If you need a refill on your cardiac medications before your next appointment, please call your pharmacy.    Signed, Ellsworth Lennox, PA-C  01/11/2018 1:00 PM    [facility] [address] [place], [place] [address] Phone: [phone]

## 2018-01-24 ENCOUNTER — Other Ambulatory Visit: Payer: Self-pay | Admitting: Cardiothoracic Surgery

## 2018-01-24 DIAGNOSIS — Z951 Presence of aortocoronary bypass graft: Secondary | ICD-10-CM

## 2018-01-25 ENCOUNTER — Encounter: Payer: Self-pay | Admitting: Cardiothoracic Surgery

## 2018-01-25 ENCOUNTER — Other Ambulatory Visit: Payer: Self-pay

## 2018-01-25 ENCOUNTER — Ambulatory Visit (INDEPENDENT_AMBULATORY_CARE_PROVIDER_SITE_OTHER): Payer: Self-pay | Admitting: Cardiothoracic Surgery

## 2018-01-25 ENCOUNTER — Ambulatory Visit
Admission: RE | Admit: 2018-01-25 | Discharge: 2018-01-25 | Disposition: A | Discharge status: Home / Self Care | Payer: 59 | Source: Ambulatory Visit | Attending: Cardiothoracic Surgery | Admitting: Cardiothoracic Surgery

## 2018-01-25 VITALS — BP 124/68 | HR 59 | Resp 18 | Ht 69.0 in | Wt 173.8 lb

## 2018-01-25 DIAGNOSIS — Z951 Presence of aortocoronary bypass graft: Secondary | ICD-10-CM | POA: Diagnosis not present

## 2018-01-25 MED ORDER — CLOPIDOGREL BISULFATE 75 MG PO TABS: 75.0000 mg | ORAL_TABLET | Freq: Every day | ORAL | 2 refills | Status: DC

## 2018-01-25 NOTE — Addendum Note (Signed)
Addended by: Tommye Standard F on: 01/25/2018 02:00 PM   Modules accepted: Orders

## 2018-01-25 NOTE — Progress Notes (Signed)
PCP is Benita Stabile, MD Referring Provider is Laqueta Linden, MD  Chief Complaint  Patient presents with  . Routine Post Op    f/u with chest xray, s/p CABG x5 12/23/17    HPI: Patient presents for routine follow-up 1 month after urgent CABG x5.  Patient underwent stress test and had V. fib arrest requiring CPR, urgent catheterization then CABG.  His LV function was reduced preoperatively but has shown improvement after separation from cardiac point bypass. He is doing well at home and walking 1.5 miles without angina.  Surgical incisions are healing well.  Appetite and overall strength improving.  Chest x-ray today is clear. Patient will start his plavix 75 mg daily and reduce aspirin to 81 mg daily.  We will refer the patient for outpatient cardiac rehab at [facility].  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 He can resume driving in light activities but with no more than 20 pounds at this point.  Past Medical History:  Diagnosis Date  . CAD (coronary artery disease)    a. 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 Essential hypertension   . Hyperlipidemia   . Impaired fasting glucose   . Other fatigue   . Palpitations     Past Surgical History:  Procedure Laterality Date  . CORONARY ARTERY BYPASS GRAFT N/A 12/23/2017   Procedure: CORONARY ARTERY BYPASS GRAFTING (CABG) x Five , using left internal mammary artery and right leg greater saphenous vein harvested endoscopically;  Surgeon: Kerin Perna, MD;  Location: [facility] OR;  Service: Open Heart Surgery;  Laterality: N/A;  . LEFT HEART CATH AND CORONARY ANGIOGRAPHY N/A 12/22/2017   Procedure: LEFT HEART CATH AND CORONARY ANGIOGRAPHY;  Surgeon: Yvonne Kendall, MD;  Location: [facility] INVASIVE CV LAB;  Service: Cardiovascular;   Laterality: N/A;  . SHOULDER ACROMIOPLASTY  07/01/2017  . TEE WITHOUT CARDIOVERSION N/A 12/23/2017   Procedure: TRANSESOPHAGEAL ECHOCARDIOGRAM (TEE);  Surgeon: Donata Clay, Theron Arista, MD;  Location: [facility] OR;  Service: Open Heart Surgery;  Laterality: N/A;    Family History  Problem Relation Age of Onset  . Diabetes Mother   . Hypertension Mother   . Heart disease Mother   . Cancer Father   . Heart disease Brother   . CAD Brother 23    Social History Social History   Tobacco Use  . Smoking status: Former Games developer  . Smokeless tobacco: Never Used  Substance Use Topics  . Alcohol use: Yes    Comment: on occasion  . Drug use: No    Current Outpatient Medications  Medication Sig Dispense Refill  . aspirin 325 MG EC tablet Take 1 tablet (325 mg total) by mouth daily.    Marland Kitchen atorvastatin (LIPITOR) 80 MG tablet Take 1 tablet (80 mg total) by mouth daily at 6 PM. 30 tablet 1  . escitalopram (LEXAPRO) 20 MG tablet Take 20 mg by mouth daily.    Marland Kitchen lisinopril (PRINIVIL,ZESTRIL) 2.5 MG tablet Take 1 tablet (2.5 mg total) by mouth daily. 30 tablet 1  . Menthol-Methyl Salicylate (BENGAY GREASELESS EX) Apply 1 application topically as needed (shoulder pain).    . metoprolol tartrate (LOPRESSOR) 25 MG tablet Take 0.5 tablets (12.5 mg total) by mouth 2 (two) times daily. 30 tablet 1  . multivitamin-iron-minerals-folic acid (CENTRUM) chewable  tablet Chew 1 tablet by mouth daily.    . clopidogrel (PLAVIX) 75 MG tablet Take 1 tablet (75 mg total) by mouth daily. 30 tablet 2   No current facility-administered medications for this visit.     No Known Allergies  Review of Systems  No popping or clicking sensation in chest No edema No syncope Not requiring narcotics for pain  BP 124/68 (BP Location: Left Arm, Patient Position: Sitting, Cuff Size: Normal)   Pulse (!) 59   Resp 18   Ht 5\' 9"  (1.753 m)   Wt 173 lb 12.8 oz (78.8 kg)   SpO2 97% Comment: RA  BMI 25.67 kg/m  Physical Exam       Exam    General- alert and comfortable    Neck- no JVD, no cervical adenopathy palpable, no carotid bruit   Lungs- clear without rales, wheezes   Cor- regular rate and rhythm, no murmur , gallop   Abdomen- soft, non-tender   Extremities - warm, non-tender, minimal edema   Neuro- oriented, appropriate, no focal weakness   Diagnostic Tests: Chest x-ray clear  Impression: Patient is doing well after urgent CABG x5. He is approximately 50% recovered. Will take until 3 months or mid-December for him to reach full recovery where he can return to work.  Plan: Start Plavix 75 mg daily and reduce aspirin to 81 mg daily Start cardiac rehab at [facility].  Referral will be made. Return at 3 months postop to discuss return to work.  No lifting more than 20 pounds until then.   Mikey Bussing, MD [facility] [phone]

## 2018-02-09 ENCOUNTER — Ambulatory Visit: Payer: 59 | Admitting: Cardiovascular Disease

## 2018-02-21 ENCOUNTER — Other Ambulatory Visit: Payer: Self-pay | Admitting: Surgical

## 2018-02-23 ENCOUNTER — Other Ambulatory Visit: Payer: Self-pay | Admitting: Cardiothoracic Surgery

## 2018-02-23 DIAGNOSIS — Z0001 Encounter for general adult medical examination with abnormal findings: Secondary | ICD-10-CM | POA: Diagnosis not present

## 2018-02-23 DIAGNOSIS — I10 Essential (primary) hypertension: Secondary | ICD-10-CM | POA: Diagnosis not present

## 2018-02-23 DIAGNOSIS — R5383 Other fatigue: Secondary | ICD-10-CM | POA: Diagnosis not present

## 2018-02-23 DIAGNOSIS — E785 Hyperlipidemia, unspecified: Secondary | ICD-10-CM | POA: Diagnosis not present

## 2018-02-24 ENCOUNTER — Other Ambulatory Visit: Payer: Self-pay | Admitting: *Deleted

## 2018-02-24 MED ORDER — LISINOPRIL 2.5 MG PO TABS: 2.5000 mg | ORAL_TABLET | Freq: Every day | ORAL | 1 refills | Status: DC

## 2018-02-24 MED ORDER — METOPROLOL TARTRATE 25 MG PO TABS: 12.5000 mg | ORAL_TABLET | Freq: Two times a day (BID) | ORAL | 1 refills | Status: DC

## 2018-02-24 MED ORDER — ATORVASTATIN CALCIUM 80 MG PO TABS: 80.0000 mg | ORAL_TABLET | Freq: Every day | ORAL | 1 refills | Status: DC

## 2018-02-27 ENCOUNTER — Telehealth: Payer: Self-pay

## 2018-02-27 DIAGNOSIS — E785 Hyperlipidemia, unspecified: Secondary | ICD-10-CM | POA: Diagnosis not present

## 2018-02-27 DIAGNOSIS — I10 Essential (primary) hypertension: Secondary | ICD-10-CM | POA: Diagnosis not present

## 2018-02-27 DIAGNOSIS — D519 Vitamin B12 deficiency anemia, unspecified: Secondary | ICD-10-CM | POA: Diagnosis not present

## 2018-02-27 NOTE — Telephone Encounter (Signed)
Mr. Drabik contacted the office requesting a refill of his Cardiac medications.  I advised him to contact his Cardiologist, Dr. Junius Argyle office to get future refills.  He acknowledged receipt.

## 2018-03-03 ENCOUNTER — Encounter (HOSPITAL_COMMUNITY)
Admission: RE | Admit: 2018-03-03 | Discharge: 2018-03-03 | Disposition: A | Discharge status: Home / Self Care | Payer: 59 | Source: Ambulatory Visit | Attending: Cardiovascular Disease | Admitting: Cardiovascular Disease

## 2018-03-03 ENCOUNTER — Encounter (HOSPITAL_COMMUNITY): Payer: Self-pay

## 2018-03-03 VITALS — BP 110/60 | HR 63 | Ht 69.0 in | Wt 177.5 lb

## 2018-03-03 DIAGNOSIS — Z951 Presence of aortocoronary bypass graft: Secondary | ICD-10-CM | POA: Insufficient documentation

## 2018-03-03 NOTE — Progress Notes (Signed)
Daily Session Note  Patient Details  Name: MARQUINN MESCHKE MRN: [id number] Date of Birth: 21-Sep-1955 Referring Provider:    Encounter Date: 03/03/2018  Check In: Session Check In - 03/03/18 0800      Check-In   Supervising physician immediately available to respond to emergencies  See telemetry face sheet for immediately available MD    Location  [facility]    Staff Present  Russella Dar, MS, EP, [facility], Exercise Physiologist;Rebel Willcutt Wynetta Emery, RN, Cory Munch, Exercise Physiologist    Medication changes reported      No    Fall or balance concerns reported     No    Tobacco Cessation  --   Patient quit smoking in 1989. He smoked for 20 years 2 1/2 packs/day.   Warm-up and Cool-down  Performed as group-led Higher education careers adviser Performed  Yes    VAD Patient?  No    PAD/SET Patient?  No      Pain Assessment   Currently in Pain?  No/denies    Multiple Pain Sites  No       Capillary Blood Glucose: No results found for this or any previous visit (from the past 24 hour(s)).    Social History   Tobacco Use  Smoking Status Former Smoker  Smokeless Tobacco Never Used    Goals Met:  Exercise tolerated well No report of cardiac concerns or symptoms Strength training completed today  Goals Unmet:  Not Applicable  Comments: Patient's orientation visit. Check out at 10:45.   Dr. Kate Sable is Medical Director for [facility].

## 2018-03-03 NOTE — Progress Notes (Signed)
Cardiac Individual Treatment Plan  Patient Details  Name: Glenn [name] MRN: [id number] Date of Birth: 1956-03-15 Referring Provider:     CARDIAC REHAB PHASE II ORIENTATION from 03/03/2018 in [place] [facility]  Referring Provider  Purvis Sheffield       Initial Encounter Date:    CARDIAC REHAB PHASE II ORIENTATION from 03/03/2018 in [facility]  Date  03/03/18      Visit Diagnosis: S/P CABG x 5  Patient's Home Medications on Admission:  Current Outpatient Medications:  .  aspirin 325 MG EC tablet, Take 1 tablet (325 mg total) by mouth daily., Disp: , Rfl:  .  atorvastatin (LIPITOR) 80 MG tablet, Take 1 tablet (80 mg total) by mouth daily at 6 PM., Disp: 90 tablet, Rfl: 1 .  clopidogrel (PLAVIX) 75 MG tablet, Take 1 tablet (75 mg total) by mouth daily., Disp: 30 tablet, Rfl: 2 .  escitalopram (LEXAPRO) 20 MG tablet, Take 20 mg by mouth daily., Disp: , Rfl:  .  lisinopril (PRINIVIL,ZESTRIL) 2.5 MG tablet, Take 1 tablet (2.5 mg total) by mouth daily., Disp: 90 tablet, Rfl: 1 .  Menthol-Methyl Salicylate (BENGAY GREASELESS EX), Apply 1 application topically as needed (shoulder pain)., Disp: , Rfl:  .  metoprolol tartrate (LOPRESSOR) 25 MG tablet, Take 0.5 tablets (12.5 mg total) by mouth 2 (two) times daily., Disp: 90 tablet, Rfl: 1 .  multivitamin-iron-minerals-folic acid (CENTRUM) chewable tablet, Chew 1 tablet by mouth daily., Disp: , Rfl:   Past Medical History: Past Medical History:  Diagnosis Date  . CAD (coronary artery disease)    a. 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 Essential hypertension   . Hyperlipidemia   . Impaired fasting glucose   . Other fatigue   . Palpitations     Tobacco Use: Social History   Tobacco Use  Smoking Status Former Smoker  Smokeless Tobacco Never Used     Labs: Recent Airline pilot for [facility] Cardiac and Pulmonary Rehab Latest Ref Rng & Units 12/23/2017 12/23/2017 12/23/2017 12/23/2017 12/24/2017   Cholestrol 0 - 200 mg/dL - - - - -   LDLCALC 0 - 99 mg/dL - - - - -   HDL >78 mg/dL - - - - -   Trlycerides <150 mg/dL - - - - -   Hemoglobin A1c 4.8 - 5.6 % - - - - -   PHART 7.350 - 7.450 7.335(L) 7.279(L) 7.339(L) - -   PCO2ART 32.0 - 48.0 mmHg 40.8 39.2 39.6 - -   HCO3 20.0 - 28.0 mmol/L 22.0 18.1(L) 21.5 - -   TCO2 22 - 32 mmol/L 23 19(L) 23 20(L) 23   ACIDBASEDEF 0.0 - 2.0 mmol/L 4.0(H) 8.0(H) 4.0(H) - -   O2SAT % 99.0 99.0 99.0 - -      Capillary Blood Glucose: Lab Results  Component Value Date   GLUCAP 89 12/28/2017   GLUCAP 89 12/27/2017   GLUCAP 85 12/27/2017   GLUCAP 81 12/27/2017   GLUCAP 80 12/27/2017     Exercise Target Goals: Exercise Program Goal: Individual exercise prescription set using results from initial 6 min walk test and THRR while considering  patient's activity barriers and safety.   Exercise Prescription Goal: Starting with aerobic activity 30 plus minutes a day, 3 days per week for initial exercise prescription. Provide home exercise prescription  and guidelines that participant acknowledges understanding prior to discharge.  Activity Barriers & Risk Stratification: Activity Barriers & Cardiac Risk Stratification - 03/03/18 1016      Activity Barriers & Cardiac Risk Stratification   Activity Barriers  Other (comment)    Comments  L shoulder pain     Cardiac Risk Stratification  High       6 Minute Walk: 6 Minute Walk    Row Name 03/03/18 1015         6 Minute Walk   Phase  Initial     Distance  1450 feet     Walk Time  6 minutes     # of Rest Breaks  0     MPH  2.75     METS  3.1     RPE  9     Perceived Dyspnea   7     VO2 Peak  12.03     Symptoms  No     Resting HR  63 bpm     Resting BP  110/60     Resting Oxygen Saturation   98 %     Exercise Oxygen Saturation   during 6 min walk  98 %     Max Ex. HR  83 bpm     Max Ex. BP  122/62     2 Minute Post BP  114/60        Oxygen Initial Assessment:   Oxygen Re-Evaluation:   Oxygen Discharge (Final Oxygen Re-Evaluation):   Initial Exercise Prescription: Initial Exercise Prescription - 03/03/18 1000      Date of Initial Exercise RX and Referring Provider   Date  03/03/18    Referring Provider  Purvis Sheffield     Expected Discharge Date  06/03/18      Treadmill   MPH  1.4    Grade  0    Minutes  17    METs  2.07      NuStep   Level  1    SPM  108    Minutes  17    METs  2.4      Prescription Details   Frequency (times per week)  3    Duration  Progress to 30 minutes of continuous aerobic without signs/symptoms of physical distress      Intensity   THRR 40-80% of Max Heartrate  100-119-138    Ratings of Perceived Exertion  11-13    Perceived Dyspnea  0-4      Progression   Progression  Continue to progress workloads to maintain intensity without signs/symptoms of physical distress.      Resistance Training   Training Prescription  Yes    Weight  1    Reps  10-15       Perform Capillary Blood Glucose checks as needed.  Exercise Prescription Changes:  Exercise Prescription Changes    Row Name 03/03/18 1000             Home Exercise Plan   Plans to continue exercise at  Home (comment)       Frequency  Add 2 additional days to program exercise sessions.       Initial Home Exercises Provided  03/03/18          Exercise Comments:   Exercise Goals and Review:  Exercise Goals    Row Name 03/03/18 1018             Exercise Goals   Increase Physical Activity  Yes       Intervention  Provide advice, education, support and counseling about physical activity/exercise needs.;Develop an individualized exercise prescription for aerobic and resistive training based on initial evaluation findings, risk stratification, comorbidities and participant's personal goals.        Expected Outcomes  Short Term: Attend rehab on a regular basis to increase amount of physical activity.;Long Term: Add in home exercise to make exercise part of routine and to increase amount of physical activity.;Long Term: Exercising regularly at least 3-5 days a week.       Increase Strength and Stamina  Yes       Intervention  Provide advice, education, support and counseling about physical activity/exercise needs.;Develop an individualized exercise prescription for aerobic and resistive training based on initial evaluation findings, risk stratification, comorbidities and participant's personal goals.       Expected Outcomes  Short Term: Increase workloads from initial exercise prescription for resistance, speed, and METs.       Able to understand and use rate of perceived exertion (RPE) scale  Yes       Intervention  Provide education and explanation on how to use RPE scale       Expected Outcomes  Short Term: Able to use RPE daily in rehab to express subjective intensity level;Long Term:  Able to use RPE to guide intensity level when exercising independently       Able to understand and use Dyspnea scale  Yes       Intervention  Provide education and explanation on how to use Dyspnea scale       Expected Outcomes  Short Term: Able to use Dyspnea scale daily in rehab to express subjective sense of shortness of breath during exertion;Long Term: Able to use Dyspnea scale to guide intensity level when exercising independently       Knowledge and understanding of Target Heart Rate Range (THRR)  Yes       Intervention  Provide education and explanation of THRR including how the numbers were predicted and where they are located for reference       Expected Outcomes  Short Term: Able to state/look up THRR;Long Term: Able to use THRR to govern intensity when exercising independently;Short Term: Able to use daily as guideline for intensity in rehab       Able to check pulse independently  Yes        Intervention  Provide education and demonstration on how to check pulse in carotid and radial arteries.;Review the importance of being able to check your own pulse for safety during independent exercise       Expected Outcomes  Short Term: Able to explain why pulse checking is important during independent exercise;Long Term: Able to check pulse independently and accurately       Understanding of Exercise Prescription  Yes       Intervention  Provide education, explanation, and written materials on patient's individual exercise prescription       Expected Outcomes  Short Term: Able to explain program exercise prescription;Long Term: Able to explain home exercise prescription to exercise independently          Exercise Goals Re-Evaluation :    Discharge Exercise Prescription (Final Exercise Prescription Changes): Exercise Prescription Changes - 03/03/18 1000      Home Exercise Plan   Plans to continue exercise at  Home (comment)    Frequency  Add 2 additional days to program exercise sessions.    Initial Home Exercises Provided  03/03/18       Nutrition:  Target Goals: Understanding of nutrition guidelines, daily intake of sodium 1500mg , cholesterol 200mg , calories 30% from fat and 7% or less from saturated fats, daily to have 5 or more servings of fruits and vegetables.  Biometrics: Pre Biometrics - 03/03/18 1018      Pre Biometrics   Height  5\' 9"  (1.753 m)    Weight  80.5 kg    Waist Circumference  37.5 inches    Hip Circumference  40 inches    Waist to Hip Ratio  0.94 %    BMI (Calculated)  26.2    Triceps Skinfold  6 mm    % Body Fat  21.9 %    Grip Strength  17.6 kg    Flexibility  12.6 in    Single Leg Stand  2.47 seconds        Nutrition Therapy Plan and Nutrition Goals:   Nutrition Assessments: Nutrition Assessments - 03/03/18 0954      MEDFICTS Scores   Pre Score  9       Nutrition Goals Re-Evaluation:   Nutrition Goals Discharge (Final Nutrition  Goals Re-Evaluation):   Psychosocial: Target Goals: Acknowledge presence or absence of significant depression and/or stress, maximize coping skills, provide positive support system. Participant is able to verbalize types and ability to use techniques and skills needed for reducing stress and depression.  Initial Review & Psychosocial Screening: Initial Psych Review & Screening - 03/03/18 1026      Family Dynamics   Good Support System?  Yes      Barriers   Psychosocial barriers to participate in program  There are no identifiable barriers or psychosocial needs.      Screening Interventions   Interventions  Encouraged to exercise       Quality of Life Scores: Quality of Life - 03/03/18 1020      Quality of Life   Select  Quality of Life      Quality of Life Scores   Health/Function Pre  26 %    Socioeconomic Pre  30 %    Psych/Spiritual Pre  30 %    Family Pre  30 %    GLOBAL Pre  28.29 %      Scores of 19 and below usually indicate a poorer quality of life in these areas.  A difference of  2-3 points is a clinically meaningful difference.  A difference of 2-3 points in the total score of the Quality of Life Index has been associated with significant improvement in overall quality of life, self-image, physical symptoms, and general health in studies assessing change in quality of life.  PHQ-9: Recent Review Flowsheet Data    Depression screen [facility] 2/9 03/03/2018   Decreased Interest 0   Down, Depressed, Hopeless 0   PHQ - 2 Score 0   Altered sleeping 0   Tired, decreased energy 1   Change in appetite 0   Feeling bad or failure about yourself  0   Trouble concentrating 0   Moving slowly or fidgety/restless 0   Suicidal thoughts 0   PHQ-9 Score 1   Difficult doing work/chores Somewhat difficult     Interpretation of Total Score  Total Score Depression Severity:  1-4 = Minimal depression, 5-9 = Mild depression, 10-14 = Moderate depression, 15-19 = Moderately severe  depression, 20-27 = Severe depression   Psychosocial Evaluation and Intervention: Psychosocial Evaluation - 03/03/18 1027      Psychosocial Evaluation &  Interventions   Interventions  Stress management education;Relaxation education;Encouraged to exercise with the program and follow exercise prescription    Comments  Patient has no psychosocial barriers identified at orientation. His initial QOL score was 28.29 and his PHQ-9 score was 1.    Expected Outcomes  Patient will have no psychosocial issues identified at discharge.     Continue Psychosocial Services   No Follow up required       Psychosocial Re-Evaluation:   Psychosocial Discharge (Final Psychosocial Re-Evaluation):   Vocational Rehabilitation: Provide vocational rehab assistance to qualifying candidates.   Vocational Rehab Evaluation & Intervention: Vocational Rehab - 03/03/18 0956      Initial Vocational Rehab Evaluation & Intervention   Assessment shows need for Vocational Rehabilitation  No       Education: Education Goals: Education classes will be provided on a weekly basis, covering required topics. Participant will state understanding/return demonstration of topics presented.  Learning Barriers/Preferences: Learning Barriers/Preferences - 03/03/18 0954      Learning Barriers/Preferences   Learning Barriers  None    Learning Preferences  Skilled Demonstration;Pictoral       Education Topics: Hypertension, Hypertension Reduction -Define heart disease and high blood pressure. Discus how high blood pressure affects the body and ways to reduce high blood pressure.   Exercise and Your Heart -Discuss why it is important to exercise, the FITT principles of exercise, normal and abnormal responses to exercise, and how to exercise safely.   Angina -Discuss definition of angina, causes of angina, treatment of angina, and how to decrease risk of having angina.   Cardiac Medications -Review what the  following cardiac medications are used for, how they affect the body, and side effects that may occur when taking the medications.  Medications include Aspirin, Beta blockers, calcium channel blockers, ACE Inhibitors, angiotensin receptor blockers, diuretics, digoxin, and antihyperlipidemics.   Congestive Heart Failure -Discuss the definition of CHF, how to live with CHF, the signs and symptoms of CHF, and how keep track of weight and sodium intake.   Heart Disease and Intimacy -Discus the effect sexual activity has on the heart, how changes occur during intimacy as we age, and safety during sexual activity.   Smoking Cessation / COPD -Discuss different methods to quit smoking, the health benefits of quitting smoking, and the definition of COPD.   Nutrition I: Fats -Discuss the types of cholesterol, what cholesterol does to the heart, and how cholesterol levels can be controlled.   Nutrition II: Labels -Discuss the different components of food labels and how to read food label   Heart Parts/Heart Disease and PAD -Discuss the anatomy of the heart, the pathway of blood circulation through the heart, and these are affected by heart disease.   Stress I: Signs and Symptoms -Discuss the causes of stress, how stress may lead to anxiety and depression, and ways to limit stress.   Stress II: Relaxation -Discuss different types of relaxation techniques to limit stress.   Warning Signs of Stroke / TIA -Discuss definition of a stroke, what the signs and symptoms are of a stroke, and how to identify when someone is having stroke.   Knowledge Questionnaire Score: Knowledge Questionnaire Score - 03/03/18 0955      Knowledge Questionnaire Score   Pre Score  19/24       Core Components/Risk Factors/Patient Goals at Admission: Personal Goals and Risk Factors at Admission - 03/03/18 0956      Core Components/Risk Factors/Patient Goals on Admission    Weight  Management  Weight  Maintenance    Personal Goal Other  Yes    Personal Goal  Patient wants to get healthier and get back to work. He wants to increase his strength and stamina.     Intervention  Patient will attend CR 3 days/week and supplement with exercise 2 days/week.    Expected Outcomes  Patient will complete the program meeting his personal goals.        Core Components/Risk Factors/Patient Goals Review:    Core Components/Risk Factors/Patient Goals at Discharge (Final Review):    ITP Comments: ITP Comments    Row Name 03/03/18 0940           ITP Comments  Patient is a 62 year old male referred by Dr. Purvis Sheffield for S/P CABG x 5. He has a h/o hyperlipidemia and is a former smoker. He has no barriers to CR identified at orientation.           Comments: Patient arrived for 1st visit/orientation/education at 0800. Patient was referred to CR by Prentice Docker due to CABGx5 (Z95.1). During orientation advised patient on arrival and appointment times what to wear, what to do before, during and after exercise. Reviewed attendance and class policy. Talked about inclement weather and class consultation policy. Pt is scheduled to return Cardiac Rehab on 03/07/18 at 800. Pt was advised to come to class 15 minutes before class starts. Patient was also given instructions on meeting with the dietician and attending the Family Structure classes. Discussed RPE/Dpysnea scales. Discussed initial THR and how to find their radial and/or carotid pulse. Discussed the initial exercise prescription and how this effects their progress. Pt is eager to get started. Patient participated in warm up stretches followed by light weights and resistance bands. Patient was able to complete 6 minute walk test.  Patient was measured for the equipment. Discussed equipment safety with patient. Took patient pre-anthropometric measurements. Patient finished visit at 9:45.

## 2018-03-03 NOTE — Progress Notes (Signed)
Cardiac/Pulmonary Rehab Medication Review by a Pharmacist  Does the patient  feel that his/her medications are working for him/her?  yes  Has the patient been experiencing any side effects to the medications prescribed?  no  Does the patient measure his/her own blood pressure or blood glucose at home?  no - check it occasionally   Does the patient have any problems obtaining medications due to transportation or finances?   no  Understanding of regimen: good Understanding of indications: good Potential of compliance: good  Questions asked to Determine Patient Understanding of Medication Regimen:  1. What is the name of the medication?  2. What is the medication used for?  3. When should it be taken?  4. How much should be taken?  5. How will you take it?  6. What side effects should you report?  Understanding Defined as: Excellent: All questions above are correct Good: Questions 1-4 are correct Fair: Questions 1-2 are correct  Poor: 1 or none of the above questions are correct   Pharmacist comments: Overall, patient has high compliance and no further questions.  Feels like medications are working well for him and no side effects.    Tad Moore 03/03/2018 9:00 AM

## 2018-03-03 NOTE — Progress Notes (Signed)
Note created in error.

## 2018-03-06 ENCOUNTER — Encounter (HOSPITAL_COMMUNITY)
Admission: RE | Admit: 2018-03-06 | Discharge: 2018-03-06 | Disposition: A | Discharge status: Home / Self Care | Payer: 59 | Source: Ambulatory Visit | Attending: Cardiovascular Disease | Admitting: Cardiovascular Disease

## 2018-03-06 DIAGNOSIS — Z951 Presence of aortocoronary bypass graft: Secondary | ICD-10-CM

## 2018-03-06 NOTE — Progress Notes (Signed)
Daily Session Note  Patient Details  Name: Glenn [name] MRN: [id number] Date of Birth: 06/22/1955 Referring Provider:     [place] from 03/03/2018 in [place]  Referring Provider  [name]       Encounter Date: 03/06/2018  Check In: Session Check In - 03/06/18 0849      Check-In   Supervising physician immediately available to respond to emergencies  See telemetry face sheet for immediately available MD    Location  [facility]    Staff Present  Aundra Dubin, RN, Cory Munch, Exercise Physiologist    Medication changes reported      No    Fall or balance concerns reported     No    Warm-up and Cool-down  Performed as group-led instruction    Resistance Training Performed  Yes    VAD Patient?  No    PAD/SET Patient?  No      Pain Assessment   Currently in Pain?  No/denies    Pain Score  0-No pain    Multiple Pain Sites  No       Capillary Blood Glucose: No results found for this or any previous visit (from the past 24 hour(s)).    Social History   Tobacco Use  Smoking Status Former Smoker  Smokeless Tobacco Never Used    Goals Met:  Independence with exercise equipment Exercise tolerated well No report of cardiac concerns or symptoms Strength training completed today  Goals Unmet:  Not Applicable  Comments: Pt able to follow exercise prescription today without complaint.  Will continue to monitor for progression. Check out 9:15.   Dr. Kate Sable is Medical Director for [facility] Cardiac and Pulmonary Re[facility].

## 2018-03-08 ENCOUNTER — Encounter (HOSPITAL_COMMUNITY)
Admission: RE | Admit: 2018-03-08 | Discharge: 2018-03-08 | Disposition: A | Discharge status: Home / Self Care | Payer: 59 | Source: Ambulatory Visit | Attending: Cardiovascular Disease | Admitting: Cardiovascular Disease

## 2018-03-08 DIAGNOSIS — Z951 Presence of aortocoronary bypass graft: Secondary | ICD-10-CM | POA: Diagnosis not present

## 2018-03-08 NOTE — Progress Notes (Signed)
Daily Session Note  Patient Details  Name: Glenn [name] MRN: [id number] Date of Birth: Nov 20, 1955 Referring Provider:     [place] from 03/03/2018 in [place]  Referring Provider  [name]       Encounter Date: 03/08/2018  Check In: Session Check In - 03/08/18 0815      Check-In   Supervising physician immediately available to respond to emergencies  See telemetry face sheet for immediately available MD    Location  [facility]    Staff Present  Russella Dar, MS, EP, [facility], Exercise Physiologist;Debra Wynetta Emery, RN, Cory Munch, Exercise Physiologist    Medication changes reported      No    Fall or balance concerns reported     No    Warm-up and Cool-down  Performed as group-led instruction    Resistance Training Performed  Yes    VAD Patient?  No    PAD/SET Patient?  No      Pain Assessment   Currently in Pain?  No/denies    Pain Score  0-No pain    Multiple Pain Sites  No       Capillary Blood Glucose: No results found for this or any previous visit (from the past 24 hour(s)).    Social History   Tobacco Use  Smoking Status Former Smoker  Smokeless Tobacco Never Used    Goals Met:  Independence with exercise equipment Exercise tolerated well No report of cardiac concerns or symptoms Strength training completed today  Goals Unmet:  Not Applicable  Comments: Pt able to follow exercise prescription today without complaint.  Will continue to monitor for progression. Check out 9:15.   Dr. Kate Sable is Medical Director for [facility] Cardiac and Pulmonary Rehab.

## 2018-03-10 ENCOUNTER — Encounter (HOSPITAL_COMMUNITY): Payer: 59

## 2018-03-13 ENCOUNTER — Encounter (HOSPITAL_COMMUNITY): Payer: 59

## 2018-03-13 ENCOUNTER — Encounter (HOSPITAL_COMMUNITY)
Admission: RE | Admit: 2018-03-13 | Discharge: 2018-03-13 | Disposition: A | Discharge status: Home / Self Care | Payer: 59 | Source: Ambulatory Visit | Attending: Cardiovascular Disease | Admitting: Cardiovascular Disease

## 2018-03-13 ENCOUNTER — Ambulatory Visit (HOSPITAL_COMMUNITY)
Admission: RE | Admit: 2018-03-13 | Discharge: 2018-03-13 | Disposition: A | Discharge status: Home / Self Care | Payer: 59 | Source: Ambulatory Visit | Attending: Student | Admitting: Student

## 2018-03-13 DIAGNOSIS — Z951 Presence of aortocoronary bypass graft: Secondary | ICD-10-CM | POA: Diagnosis not present

## 2018-03-13 DIAGNOSIS — I255 Ischemic cardiomyopathy: Secondary | ICD-10-CM | POA: Diagnosis not present

## 2018-03-13 NOTE — Progress Notes (Signed)
Cardiac Individual Treatment Plan  Patient Details  Name: BRYLER DIBBLE MRN: [id number] Date of Birth: January 26, 1956 Referring Provider:     CARDIAC REHAB PHASE II ORIENTATION from 03/03/2018 in [place] [facility]  Referring Provider  Purvis Sheffield       Initial Encounter Date:    CARDIAC REHAB PHASE II ORIENTATION from 03/03/2018 in [facility]  Date  03/03/18      Visit Diagnosis: S/P CABG x 5  Patient's Home Medications on Admission:  Current Outpatient Medications:  .  aspirin 325 MG EC tablet, Take 1 tablet (325 mg total) by mouth daily., Disp: , Rfl:  .  atorvastatin (LIPITOR) 80 MG tablet, Take 1 tablet (80 mg total) by mouth daily at 6 PM., Disp: 90 tablet, Rfl: 1 .  clopidogrel (PLAVIX) 75 MG tablet, Take 1 tablet (75 mg total) by mouth daily., Disp: 30 tablet, Rfl: 2 .  escitalopram (LEXAPRO) 20 MG tablet, Take 20 mg by mouth daily., Disp: , Rfl:  .  lisinopril (PRINIVIL,ZESTRIL) 2.5 MG tablet, Take 1 tablet (2.5 mg total) by mouth daily., Disp: 90 tablet, Rfl: 1 .  Menthol-Methyl Salicylate (BENGAY GREASELESS EX), Apply 1 application topically as needed (shoulder pain)., Disp: , Rfl:  .  metoprolol tartrate (LOPRESSOR) 25 MG tablet, Take 0.5 tablets (12.5 mg total) by mouth 2 (two) times daily., Disp: 90 tablet, Rfl: 1 .  multivitamin-iron-minerals-folic acid (CENTRUM) chewable tablet, Chew 1 tablet by mouth daily., Disp: , Rfl:   Past Medical History: Past Medical History:  Diagnosis Date  . CAD (coronary artery disease)    a. 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 Essential hypertension   . Hyperlipidemia   . Impaired fasting glucose   . Other fatigue   . Palpitations     Tobacco Use: Social History   Tobacco Use  Smoking Status Former Smoker  Smokeless Tobacco Never Used     Labs: Recent Airline pilot for [facility] Cardiac and Pulmonary Rehab Latest Ref Rng & Units 12/23/2017 12/23/2017 12/23/2017 12/23/2017 12/24/2017   Cholestrol 0 - 200 mg/dL - - - - -   LDLCALC 0 - 99 mg/dL - - - - -   HDL >40 mg/dL - - - - -   Trlycerides <150 mg/dL - - - - -   Hemoglobin A1c 4.8 - 5.6 % - - - - -   PHART 7.350 - 7.450 7.335(L) 7.279(L) 7.339(L) - -   PCO2ART 32.0 - 48.0 mmHg 40.8 39.2 39.6 - -   HCO3 20.0 - 28.0 mmol/L 22.0 18.1(L) 21.5 - -   TCO2 22 - 32 mmol/L 23 19(L) 23 20(L) 23   ACIDBASEDEF 0.0 - 2.0 mmol/L 4.0(H) 8.0(H) 4.0(H) - -   O2SAT % 99.0 99.0 99.0 - -      Capillary Blood Glucose: Lab Results  Component Value Date   GLUCAP 89 12/28/2017   GLUCAP 89 12/27/2017   GLUCAP 85 12/27/2017   GLUCAP 81 12/27/2017   GLUCAP 80 12/27/2017     Exercise Target Goals: Exercise Program Goal: Individual exercise prescription set using results from initial 6 min walk test and THRR while considering  patient's activity barriers and safety.   Exercise Prescription Goal: Starting with aerobic activity 30 plus minutes a day, 3 days per week for initial exercise prescription. Provide home exercise prescription  and guidelines that participant acknowledges understanding prior to discharge.  Activity Barriers & Risk Stratification: Activity Barriers & Cardiac Risk Stratification - 03/03/18 1016      Activity Barriers & Cardiac Risk Stratification   Activity Barriers  Other (comment)    Comments  L shoulder pain     Cardiac Risk Stratification  High       6 Minute Walk: 6 Minute Walk    Row Name 03/03/18 1015         6 Minute Walk   Phase  Initial     Distance  1450 feet     Walk Time  6 minutes     # of Rest Breaks  0     MPH  2.75     METS  3.1     RPE  9     Perceived Dyspnea   7     VO2 Peak  12.03     Symptoms  No     Resting HR  63 bpm     Resting BP  110/60     Resting Oxygen Saturation   98 %     Exercise Oxygen Saturation   during 6 min walk  98 %     Max Ex. HR  83 bpm     Max Ex. BP  122/62     2 Minute Post BP  114/60        Oxygen Initial Assessment:   Oxygen Re-Evaluation:   Oxygen Discharge (Final Oxygen Re-Evaluation):   Initial Exercise Prescription: Initial Exercise Prescription - 03/03/18 1000      Date of Initial Exercise RX and Referring Provider   Date  03/03/18    Referring Provider  Purvis Sheffield     Expected Discharge Date  06/03/18      Treadmill   MPH  1.4    Grade  0    Minutes  17    METs  2.07      NuStep   Level  1    SPM  108    Minutes  17    METs  2.4      Prescription Details   Frequency (times per week)  3    Duration  Progress to 30 minutes of continuous aerobic without signs/symptoms of physical distress      Intensity   THRR 40-80% of Max Heartrate  100-119-138    Ratings of Perceived Exertion  11-13    Perceived Dyspnea  0-4      Progression   Progression  Continue to progress workloads to maintain intensity without signs/symptoms of physical distress.      Resistance Training   Training Prescription  Yes    Weight  1    Reps  10-15       Perform Capillary Blood Glucose checks as needed.  Exercise Prescription Changes: Exercise Prescription Changes    Row Name 03/03/18 1000             Home Exercise Plan   Plans to continue exercise at  Home (comment)       Frequency  Add 2 additional days to program exercise sessions.       Initial Home Exercises Provided  03/03/18          Exercise Comments:   Exercise Goals and Review: Exercise Goals    Row Name 03/03/18 1018             Exercise Goals   Increase Physical Activity  Yes  Intervention  Provide advice, education, support and counseling about physical activity/exercise needs.;Develop an individualized exercise prescription for aerobic and resistive training based on initial evaluation findings, risk stratification, comorbidities and participant's personal goals.        Expected Outcomes  Short Term: Attend rehab on a regular basis to increase amount of physical activity.;Long Term: Add in home exercise to make exercise part of routine and to increase amount of physical activity.;Long Term: Exercising regularly at least 3-5 days a week.       Increase Strength and Stamina  Yes       Intervention  Provide advice, education, support and counseling about physical activity/exercise needs.;Develop an individualized exercise prescription for aerobic and resistive training based on initial evaluation findings, risk stratification, comorbidities and participant's personal goals.       Expected Outcomes  Short Term: Increase workloads from initial exercise prescription for resistance, speed, and METs.       Able to understand and use rate of perceived exertion (RPE) scale  Yes       Intervention  Provide education and explanation on how to use RPE scale       Expected Outcomes  Short Term: Able to use RPE daily in rehab to express subjective intensity level;Long Term:  Able to use RPE to guide intensity level when exercising independently       Able to understand and use Dyspnea scale  Yes       Intervention  Provide education and explanation on how to use Dyspnea scale       Expected Outcomes  Short Term: Able to use Dyspnea scale daily in rehab to express subjective sense of shortness of breath during exertion;Long Term: Able to use Dyspnea scale to guide intensity level when exercising independently       Knowledge and understanding of Target Heart Rate Range (THRR)  Yes       Intervention  Provide education and explanation of THRR including how the numbers were predicted and where they are located for reference       Expected Outcomes  Short Term: Able to state/look up THRR;Long Term: Able to use THRR to govern intensity when exercising independently;Short Term: Able to use daily as guideline for intensity in rehab       Able to check pulse independently  Yes        Intervention  Provide education and demonstration on how to check pulse in carotid and radial arteries.;Review the importance of being able to check your own pulse for safety during independent exercise       Expected Outcomes  Short Term: Able to explain why pulse checking is important during independent exercise;Long Term: Able to check pulse independently and accurately       Understanding of Exercise Prescription  Yes       Intervention  Provide education, explanation, and written materials on patient's individual exercise prescription       Expected Outcomes  Short Term: Able to explain program exercise prescription;Long Term: Able to explain home exercise prescription to exercise independently          Exercise Goals Re-Evaluation :    Discharge Exercise Prescription (Final Exercise Prescription Changes): Exercise Prescription Changes - 03/03/18 1000      Home Exercise Plan   Plans to continue exercise at  Home (comment)    Frequency  Add 2 additional days to program exercise sessions.    Initial Home Exercises Provided  03/03/18  Nutrition:  Target Goals: Understanding of nutrition guidelines, daily intake of sodium 1500mg , cholesterol 200mg , calories 30% from fat and 7% or less from saturated fats, daily to have 5 or more servings of fruits and vegetables.  Biometrics: Pre Biometrics - 03/03/18 1018      Pre Biometrics   Height  5\' 9"  (1.753 m)    Weight  177 lb 8 oz (80.5 kg)    Waist Circumference  37.5 inches    Hip Circumference  40 inches    Waist to Hip Ratio  0.94 %    BMI (Calculated)  26.2    Triceps Skinfold  6 mm    % Body Fat  21.9 %    Grip Strength  17.6 kg    Flexibility  12.6 in    Single Leg Stand  2.47 seconds        Nutrition Therapy Plan and Nutrition Goals:   Nutrition Assessments: Nutrition Assessments - 03/03/18 0954      MEDFICTS Scores   Pre Score  9       Nutrition Goals Re-Evaluation:   Nutrition Goals Discharge  (Final Nutrition Goals Re-Evaluation):   Psychosocial: Target Goals: Acknowledge presence or absence of significant depression and/or stress, maximize coping skills, provide positive support system. Participant is able to verbalize types and ability to use techniques and skills needed for reducing stress and depression.  Initial Review & Psychosocial Screening: Initial Psych Review & Screening - 03/03/18 1026      Family Dynamics   Good Support System?  Yes      Barriers   Psychosocial barriers to participate in program  There are no identifiable barriers or psychosocial needs.      Screening Interventions   Interventions  Encouraged to exercise       Quality of Life Scores: Quality of Life - 03/03/18 1020      Quality of Life   Select  Quality of Life      Quality of Life Scores   Health/Function Pre  26 %    Socioeconomic Pre  30 %    Psych/Spiritual Pre  30 %    Family Pre  30 %    GLOBAL Pre  28.29 %      Scores of 19 and below usually indicate a poorer quality of life in these areas.  A difference of  2-3 points is a clinically meaningful difference.  A difference of 2-3 points in the total score of the Quality of Life Index has been associated with significant improvement in overall quality of life, self-image, physical symptoms, and general health in studies assessing change in quality of life.  PHQ-9: Recent Review Flowsheet Data    Depression screen [facility] 2/9 03/03/2018   Decreased Interest 0   Down, Depressed, Hopeless 0   PHQ - 2 Score 0   Altered sleeping 0   Tired, decreased energy 1   Change in appetite 0   Feeling bad or failure about yourself  0   Trouble concentrating 0   Moving slowly or fidgety/restless 0   Suicidal thoughts 0   PHQ-9 Score 1   Difficult doing work/chores Somewhat difficult     Interpretation of Total Score  Total Score Depression Severity:  1-4 = Minimal depression, 5-9 = Mild depression, 10-14 = Moderate depression, 15-19 =  Moderately severe depression, 20-27 = Severe depression   Psychosocial Evaluation and Intervention: Psychosocial Evaluation - 03/03/18 1027      Psychosocial Evaluation & Interventions  Interventions  Stress management education;Relaxation education;Encouraged to exercise with the program and follow exercise prescription    Comments  Patient has no psychosocial barriers identified at orientation. His initial QOL score was 28.29 and his PHQ-9 score was 1.    Expected Outcomes  Patient will have no psychosocial issues identified at discharge.     Continue Psychosocial Services   No Follow up required       Psychosocial Re-Evaluation:   Psychosocial Discharge (Final Psychosocial Re-Evaluation):   Vocational Rehabilitation: Provide vocational rehab assistance to qualifying candidates.   Vocational Rehab Evaluation & Intervention: Vocational Rehab - 03/03/18 0956      Initial Vocational Rehab Evaluation & Intervention   Assessment shows need for Vocational Rehabilitation  No       Education: Education Goals: Education classes will be provided on a weekly basis, covering required topics. Participant will state understanding/return demonstration of topics presented.  Learning Barriers/Preferences: Learning Barriers/Preferences - 03/03/18 0954      Learning Barriers/Preferences   Learning Barriers  None    Learning Preferences  Skilled Demonstration;Pictoral       Education Topics: Hypertension, Hypertension Reduction -Define heart disease and high blood pressure. Discus how high blood pressure affects the body and ways to reduce high blood pressure.   Exercise and Your Heart -Discuss why it is important to exercise, the FITT principles of exercise, normal and abnormal responses to exercise, and how to exercise safely.   Angina -Discuss definition of angina, causes of angina, treatment of angina, and how to decrease risk of having angina.   Cardiac Medications -Review  what the following cardiac medications are used for, how they affect the body, and side effects that may occur when taking the medications.  Medications include Aspirin, Beta blockers, calcium channel blockers, ACE Inhibitors, angiotensin receptor blockers, diuretics, digoxin, and antihyperlipidemics.   CARDIAC REHAB PHASE II EXERCISE from 03/08/2018 in [place] [facility]  Date  03/08/18  Educator  Timoteo Expose  Instruction Review Code  2- Demonstrated Understanding      Congestive Heart Failure -Discuss the definition of CHF, how to live with CHF, the signs and symptoms of CHF, and how keep track of weight and sodium intake.   Heart Disease and Intimacy -Discus the effect sexual activity has on the heart, how changes occur during intimacy as we age, and safety during sexual activity.   Smoking Cessation / COPD -Discuss different methods to quit smoking, the health benefits of quitting smoking, and the definition of COPD.   Nutrition I: Fats -Discuss the types of cholesterol, what cholesterol does to the heart, and how cholesterol levels can be controlled.   Nutrition II: Labels -Discuss the different components of food labels and how to read food label   Heart Parts/Heart Disease and PAD -Discuss the anatomy of the heart, the pathway of blood circulation through the heart, and these are affected by heart disease.   Stress I: Signs and Symptoms -Discuss the causes of stress, how stress may lead to anxiety and depression, and ways to limit stress.   Stress II: Relaxation -Discuss different types of relaxation techniques to limit stress.   Warning Signs of Stroke / TIA -Discuss definition of a stroke, what the signs and symptoms are of a stroke, and how to identify when someone is having stroke.   Knowledge Questionnaire Score: Knowledge Questionnaire Score - 03/03/18 0955      Knowledge Questionnaire Score   Pre Score  19/24  Core Components/Risk  Factors/Patient Goals at Admission: Personal Goals and Risk Factors at Admission - 03/03/18 0956      Core Components/Risk Factors/Patient Goals on Admission    Weight Management  Weight Maintenance    Personal Goal Other  Yes    Personal Goal  Patient wants to get healthier and get back to work. He wants to increase his strength and stamina.     Intervention  Patient will attend CR 3 days/week and supplement with exercise 2 days/week.    Expected Outcomes  Patient will complete the program meeting his personal goals.        Core Components/Risk Factors/Patient Goals Review:    Core Components/Risk Factors/Patient Goals at Discharge (Final Review):    ITP Comments: ITP Comments    Row Name 03/03/18 0940 03/13/18 1010         ITP Comments  Patient is a 62 year old male referred by Dr. Purvis Sheffield for S/P CABG x 5. He has a h/o hyperlipidemia and is a former smoker. He has no barriers to CR identified at orientation.   Patient is new to program. He has completed 3 sessions. Will continue to monitor for progress.          Comments: ITP REVIEW Patient is new to program. He has completed 3 sessions. Will continue to monitor for progress.

## 2018-03-13 NOTE — Progress Notes (Signed)
Daily Session Note  Patient Details  Name: Glenn [name] MRN: [id number] Date of Birth: [birth year] Referring Provider:     [place] from 03/03/2018 in [place]  Referring Provider  [name]       Encounter Date: 03/13/2018  Check In: Session Check In - 03/13/18 0930      Check-In   Supervising physician immediately available to respond to emergencies  See telemetry face sheet for immediately available MD    Location  [facility]    Staff Present  Aundra Dubin, RN, Cory Munch, Exercise Physiologist    Medication changes reported      No    Fall or balance concerns reported     No    Warm-up and Cool-down  Performed as group-led instruction    Resistance Training Performed  Yes    VAD Patient?  No    PAD/SET Patient?  No      Pain Assessment   Currently in Pain?  No/denies    Pain Score  0-No pain    Multiple Pain Sites  No       Capillary Blood Glucose: No results found for this or any previous visit (from the past 24 hour(s)).    Social History   Tobacco Use  Smoking Status Former Smoker  Smokeless Tobacco Never Used    Goals Met:  Independence with exercise equipment Exercise tolerated well No report of cardiac concerns or symptoms Strength training completed today  Goals Unmet:  Not Applicable  Comments: Pt able to follow exercise prescription today without complaint.  Will continue to monitor for progression. Check out 1030.   Dr. Kate Sable is Medical Director for [facility] Cardiac and Pulmonary Rehab.

## 2018-03-13 NOTE — Progress Notes (Signed)
*  PRELIMINARY RESULTS* Echocardiogram 2D Echocardiogram has been performed.  Stacey Drain 03/13/2018, 10:34 AM

## 2018-03-15 ENCOUNTER — Encounter (HOSPITAL_COMMUNITY)
Admission: RE | Admit: 2018-03-15 | Discharge: 2018-03-15 | Disposition: A | Discharge status: Home / Self Care | Payer: 59 | Source: Ambulatory Visit | Attending: Cardiovascular Disease | Admitting: Cardiovascular Disease

## 2018-03-15 DIAGNOSIS — Z951 Presence of aortocoronary bypass graft: Secondary | ICD-10-CM

## 2018-03-15 NOTE — Progress Notes (Signed)
Daily Session Note  Patient Details  Name: Glenn [name] MRN: [id number] Date of Birth: [birth year] Referring Provider:     [place] from 03/03/2018 in [place]  Referring Provider  [name]       Encounter Date: 03/15/2018  Check In: Session Check In - 03/15/18 0815      Check-In   Supervising physician immediately available to respond to emergencies  See telemetry face sheet for immediately available MD    Location  [facility]    Staff Present  Aundra Dubin, RN, Cory Munch, Exercise Physiologist    Medication changes reported      No    Fall or balance concerns reported     No    Warm-up and Cool-down  Performed as group-led instruction    Resistance Training Performed  Yes    VAD Patient?  No    PAD/SET Patient?  No      Pain Assessment   Currently in Pain?  No/denies    Pain Score  0-No pain    Multiple Pain Sites  No       Capillary Blood Glucose: No results found for this or any previous visit (from the past 24 hour(s)).    Social History   Tobacco Use  Smoking Status Former Smoker  Smokeless Tobacco Never Used    Goals Met:  Independence with exercise equipment Exercise tolerated well No report of cardiac concerns or symptoms Strength training completed today  Goals Unmet:  Not Applicable  Comments: Pt able to follow exercise prescription today without complaint.  Will continue to monitor for progression. Check out 915.   Dr. Kate Sable is Medical Director for [facility] Cardiac and Pulmonary Rehab.

## 2018-03-17 ENCOUNTER — Encounter (HOSPITAL_COMMUNITY)
Admission: RE | Admit: 2018-03-17 | Discharge: 2018-03-17 | Disposition: A | Discharge status: Home / Self Care | Payer: 59 | Source: Ambulatory Visit | Attending: Cardiovascular Disease | Admitting: Cardiovascular Disease

## 2018-03-17 DIAGNOSIS — Z951 Presence of aortocoronary bypass graft: Secondary | ICD-10-CM | POA: Diagnosis not present

## 2018-03-17 NOTE — Progress Notes (Signed)
Daily Session Note  Patient Details  Name: Glenn [name] MRN: [id number] Date of Birth: 03-30-1956 Referring Provider:     [place] from 03/03/2018 in [place]  Referring Provider  [name]       Encounter Date: 03/17/2018  Check In: Session Check In - 03/17/18 0815      Check-In   Supervising physician immediately available to respond to emergencies  See telemetry face sheet for immediately available MD    Location  [facility]    Staff Present  Aundra Dubin, RN, Cory Munch, Exercise Physiologist    Medication changes reported      No    Fall or balance concerns reported     No    Warm-up and Cool-down  Performed as group-led instruction    Resistance Training Performed  Yes    VAD Patient?  No    PAD/SET Patient?  No      Pain Assessment   Currently in Pain?  No/denies    Pain Score  0-No pain    Multiple Pain Sites  No       Capillary Blood Glucose: No results found for this or any previous visit (from the past 24 hour(s)).    Social History   Tobacco Use  Smoking Status Former Smoker  Smokeless Tobacco Never Used    Goals Met:  Independence with exercise equipment Exercise tolerated well No report of cardiac concerns or symptoms Strength training completed today  Goals Unmet:  Not Applicable  Comments: Pt able to follow exercise prescription today without complaint.  Will continue to monitor for progression. Check out 9:15.   Dr. Kate Sable is Medical Director for [facility] Card[facility] and Pulmonary Rehab.

## 2018-03-20 ENCOUNTER — Encounter (HOSPITAL_COMMUNITY)
Admission: RE | Admit: 2018-03-20 | Discharge: 2018-03-20 | Disposition: A | Discharge status: Home / Self Care | Payer: 59 | Source: Ambulatory Visit | Attending: Cardiovascular Disease | Admitting: Cardiovascular Disease

## 2018-03-20 DIAGNOSIS — Z951 Presence of aortocoronary bypass graft: Secondary | ICD-10-CM

## 2018-03-20 NOTE — Progress Notes (Signed)
Daily Session Note  Patient Details  Name: Glenn [name] MRN: [id number] Date of Birth: 06/16/1955 Referring Provider:     [place] from 03/03/2018 in [place]  Referring Provider  [name]       Encounter Date: 03/20/2018  Check In: Session Check In - 03/20/18 0815      Check-In   Supervising physician immediately available to respond to emergencies  See telemetry face sheet for immediately available MD    Location  [facility]    Staff Present  Aundra Dubin, RN, Cory Munch, Exercise Physiologist    Medication changes reported      No    Fall or balance concerns reported     No    Warm-up and Cool-down  Performed as group-led instruction    Resistance Training Performed  Yes    VAD Patient?  No    PAD/SET Patient?  No      Pain Assessment   Currently in Pain?  No/denies    Pain Score  0-No pain    Multiple Pain Sites  No       Capillary Blood Glucose: No results found for this or any previous visit (from the past 24 hour(s)).    Social History   Tobacco Use  Smoking Status Former Smoker  Smokeless Tobacco Never Used    Goals Met:  Independence with exercise equipment Exercise tolerated well No report of cardiac concerns or symptoms Strength training completed today  Goals Unmet:  Not Applicable  Comments: Pt able to follow exercise prescription today without complaint.  Will continue to monitor for progression. Check out 915.   Dr. Kate Sable is Medical Director for [facility] Cardiac and Pulmonary Rehab.

## 2018-03-21 ENCOUNTER — Ambulatory Visit (INDEPENDENT_AMBULATORY_CARE_PROVIDER_SITE_OTHER): Payer: 59 | Admitting: Cardiovascular Disease

## 2018-03-21 ENCOUNTER — Encounter: Payer: Self-pay | Admitting: Cardiovascular Disease

## 2018-03-21 VITALS — BP 128/76 | HR 75 | Ht 69.5 in | Wt 182.0 lb

## 2018-03-21 DIAGNOSIS — I255 Ischemic cardiomyopathy: Secondary | ICD-10-CM

## 2018-03-21 DIAGNOSIS — E785 Hyperlipidemia, unspecified: Secondary | ICD-10-CM

## 2018-03-21 DIAGNOSIS — I10 Essential (primary) hypertension: Secondary | ICD-10-CM

## 2018-03-21 DIAGNOSIS — I25708 Atherosclerosis of coronary artery bypass graft(s), unspecified, with other forms of angina pectoris: Secondary | ICD-10-CM

## 2018-03-21 DIAGNOSIS — I6523 Occlusion and stenosis of bilateral carotid arteries: Secondary | ICD-10-CM

## 2018-03-21 MED ORDER — METOPROLOL SUCCINATE ER 25 MG PO TB24: 25.0000 mg | ORAL_TABLET | Freq: Every day | ORAL | 3 refills | Status: DC

## 2018-03-21 NOTE — Patient Instructions (Signed)
Medication Instructions:  STOP LOPRESSOR   START TOPROL XL- 25 MG DAILY   Labwork: I will request a copy of lipids from PCP     Testing/Procedures: NONE  Follow-Up: Your physician wants you to follow-up in: April 2020. 3 You will receive a reminder letter in the mail two months in advance. If you don't receive a letter, please call our office to schedule the follow-up appointment.   Any Other Special Instructions Will Be Listed Below (If Applicable).     If you need a refill on your cardiac medications before your next appointment, please call your pharmacy.

## 2018-03-21 NOTE — Progress Notes (Signed)
SUBJECTIVE: The patient presents for routine follow-up.  He has been participating in cardiac rehabilitation and doing well with the program.  I initially evaluated him for fatigue, chest pain, and progressive exertional dyspnea on 12/20/2017 and arrange for stress testing.  During the stress test he developed ventricular tachycardia and underwent CPR and defibrillation with subsequent transfer to [facility] for coronary angiography.  He had severe obstructive coronary artery disease and underwent 5 vessel CABG on 12/23/2017.  He is doing well overall.  He has some chest soreness at the incision site particularly when he coughs but no exertional chest pain or dyspnea.  He denies palpitations.  If he stands up too quickly he becomes lightheaded and dizzy.  He denies syncope.  He also denies leg swelling, orthopnea, and paroxysmal nocturnal dyspnea.  Echocardiogram on 03/13/2018 showed mild to moderately reduced left ventricular systolic function, LVEF 40 to 16%, diffuse hypokinesis, regional wall variation, and mild mitral regurgitation.  Review of Systems: As per "subjective", otherwise negative.  No Known Allergies  Current Outpatient Medications  Medication Sig Dispense Refill  . aspirin 325 MG EC tablet Take 1 tablet (325 mg total) by mouth daily.    Marland Kitchen atorvastatin (LIPITOR) 80 MG tablet Take 1 tablet (80 mg total) by mouth daily at 6 PM. 90 tablet 1  . clopidogrel (PLAVIX) 75 MG tablet Take 1 tablet (75 mg total) by mouth daily. 30 tablet 2  . escitalopram (LEXAPRO) 20 MG tablet Take 20 mg by mouth daily.    Marland Kitchen lisinopril (PRINIVIL,ZESTRIL) 2.5 MG tablet Take 1 tablet (2.5 mg total) by mouth daily. 90 tablet 1  . Menthol-Methyl Salicylate (BENGAY GREASELESS EX) Apply 1 application topically as needed (shoulder pain).    . metoprolol tartrate (LOPRESSOR) 25 MG tablet Take 0.5 tablets (12.5 mg total) by mouth 2 (two) times daily. 90 tablet 1  . multivitamin-iron-minerals-folic acid  (CENTRUM) chewable tablet Chew 1 tablet by mouth daily.     No current facility-administered medications for this visit.     Past Medical History:  Diagnosis Date  . CAD (coronary artery disease)    a. 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 Essential hypertension   . Hyperlipidemia   . Impaired fasting glucose   . Other fatigue   . Palpitations     Past Surgical History:  Procedure Laterality Date  . CORONARY ARTERY BYPASS GRAFT N/A 12/23/2017   Procedure: CORONARY ARTERY BYPASS GRAFTING (CABG) x Five , using left internal mammary artery and right leg greater saphenous vein harvested endoscopically;  Surgeon: Kerin Perna, MD;  Location: [facility] OR;  Service: Open Heart Surgery;  Laterality: N/A;  . LEFT HEART CATH AND CORONARY ANGIOGRAPHY N/A 12/22/2017   Procedure: LEFT HEART CATH AND CORONARY ANGIOGRAPHY;  Surgeon: Yvonne Kendall, MD;  Location: [facility] INVASIVE CV LAB;  Service: Cardiovascular;  Laterality: N/A;  . SHOULDER ACROMIOPLASTY  07/01/2017  . TEE WITHOUT CARDIOVERSION N/A 12/23/2017   Procedure: TRANSESOPHAGEAL ECHOCARDIOGRAM (TEE);  Surgeon: Donata Clay, Theron Arista, MD;  Location: [facility] OR;  Service: Open Heart Surgery;  Laterality: N/A;    Social History   Socioeconomic History  . Marital status: Married    Spouse name: Not on file  . Number of children: Not on file  . Years of education: Not on file  . Highest education level: Not on file  Occupational History  . Not on file  Social Needs  . Financial resource strain: Not on file  . Food insecurity:    Worry: Not on file    Inability: Not on file  . Transportation needs:    Medical: Not on file    Non-medical: Not on file  Tobacco Use  . Smoking status: Former Games developer  . Smokeless tobacco: Never Used  Substance and Sexual Activity  . Alcohol use: Yes    Comment: on  occasion  . Drug use: No  . Sexual activity: Not on file  Lifestyle  . Physical activity:    Days per week: Not on file    Minutes per session: Not on file  . Stress: Not on file  Relationships  . Social connections:    Talks on phone: Not on file    Gets together: Not on file    Attends religious service: Not on file    Active member of club or organization: Not on file    Attends meetings of clubs or organizations: Not on file    Relationship status: Not on file  . Intimate partner violence:    Fear of current or ex partner: Not on file    Emotionally abused: Not on file    Physically abused: Not on file    Forced sexual activity: Not on file  Other Topics Concern  . Not on file  Social History Narrative  . Not on file     Vitals:   03/21/18 1328  BP: 128/76  Pulse: 75  SpO2: 97%  Weight: 182 lb (82.6 kg)  Height: 5' 9.5" (1.765 m)    Wt Readings from Last 3 Encounters:  03/21/18 182 lb (82.6 kg)  03/03/18 177 lb 8 oz (80.5 kg)  01/25/18 173 lb 12.8 oz (78.8 kg)     PHYSICAL EXAM General: NAD HEENT: Normal. Neck: No JVD, no thyromegaly. Lungs: Clear to auscultation bilaterally with normal respiratory effort. CV: Regular rate and rhythm, normal S1/S2, no S3/S4, no murmur. No pretibial or periankle edema.  No carotid bruit.   Abdomen: Soft, nontender, no distention.  Neurologic: Alert and oriented.  Psych: Normal affect. Skin: Normal. Musculoskeletal: No gross deformities.    ECG: Reviewed above under Subjective   Labs: Lab Results  Component Value Date/Time   K 3.9 12/27/2017 03:50 AM   BUN 22 12/27/2017 03:50 AM   CREATININE 1.03 12/27/2017 03:50 AM   ALT 24 12/23/2017 05:20 AM   HGB 9.6 (L) 12/27/2017 03:50 AM     Lipids: Lab Results  Component Value Date/Time   LDLCALC 136 (H) 12/23/2017 05:20 AM   CHOL 196 12/23/2017 05:20 AM   TRIG 60 12/23/2017 05:20 AM   HDL 48 12/23/2017 05:20 AM       ASSESSMENT AND PLAN: 1.  Coronary artery  disease: 5 vessel CABG on 12/23/2017 with LIMA-LAD, SVG-D1, Seq-RI-LCx, and SVG-PDA.    Symptomatically stable.  Continue aspirin 81 mg, Plavix (as recommended by CT surgery), and atorvastatin.  I will switch Lopressor to Toprol-XL 25 mg daily given LVEF 40 to 45%.  2.  Ischemic cardiomyopathy: LVEF 40 to 45% at the time of hospitalization and on repeat echocardiogram on 03/13/2018 as detailed above.  Currently on low-dose Lopressor and lisinopril.  Soft blood pressures have limited advancement of medication titration in the past.  Blood pressure is normal today.  I will switch Lopressor to Toprol-XL 25 mg daily.  3.  Hypertension: Blood pressure is normal.  He  remains on low-dose lisinopril and Lopressor.  I will switch Lopressor to Toprol-XL 25 mg daily.  4.  Hyperlipidemia: LDL 136 on 12/23/2017.  Full lipid panel reviewed above.  I will request most recent lipids from PCP.  Continue atorvastatin 80 mg.  5.  Carotid artery stenosis: Dopplers in 12/2017 showed 40-59% RICA stenosis and 1-39% LICA stenosis. Will need repeat doppler studies in 12/2018.  Continue aspirin and statin.   Disposition: Follow up April 2020   Prentice Docker, M.D., F.A.C.C.

## 2018-03-22 ENCOUNTER — Encounter (HOSPITAL_COMMUNITY)
Admission: RE | Admit: 2018-03-22 | Discharge: 2018-03-22 | Disposition: A | Discharge status: Home / Self Care | Payer: 59 | Source: Ambulatory Visit | Attending: Cardiovascular Disease | Admitting: Cardiovascular Disease

## 2018-03-22 DIAGNOSIS — Z951 Presence of aortocoronary bypass graft: Secondary | ICD-10-CM

## 2018-03-22 NOTE — Progress Notes (Signed)
Daily Session Note  Patient Details  Name: Glenn [name] MRN: [id number] Date of Birth: [birth year] Referring Provider:     [place] from 03/03/2018 in [place]  Referring Provider  [name]       Encounter Date: 03/22/2018  Check In: Session Check In - 03/22/18 0815      Check-In   Supervising physician immediately available to respond to emergencies  See telemetry face sheet for immediately available MD    Location  [facility]    Staff Present  Aundra Dubin, RN, Cory Munch, Exercise Physiologist;Other    Medication changes reported      No    Fall or balance concerns reported     No    Warm-up and Cool-down  Performed as group-led instruction    Resistance Training Performed  Yes    VAD Patient?  No    PAD/SET Patient?  No      Pain Assessment   Currently in Pain?  No/denies    Pain Score  0-No pain    Multiple Pain Sites  No       Capillary Blood Glucose: No results found for this or any previous visit (from the past 24 hour(s)).    Social History   Tobacco Use  Smoking Status Former Smoker  Smokeless Tobacco Never Used    Goals Met:  Independence with exercise equipment Exercise tolerated well No report of cardiac concerns or symptoms Strength training completed today  Goals Unmet:  Not Applicable  Comments: Pt able to follow exercise prescription today without complaint.  Will continue to monitor for progression. Check out 915.   Dr. Kate Sable is Medical Director for [facility].

## 2018-03-24 ENCOUNTER — Encounter (HOSPITAL_COMMUNITY)
Admission: RE | Admit: 2018-03-24 | Discharge: 2018-03-24 | Disposition: A | Discharge status: Home / Self Care | Payer: 59 | Source: Ambulatory Visit | Attending: Cardiovascular Disease | Admitting: Cardiovascular Disease

## 2018-03-24 DIAGNOSIS — Z951 Presence of aortocoronary bypass graft: Secondary | ICD-10-CM

## 2018-03-24 NOTE — Progress Notes (Signed)
Daily Session Note  Patient Details  Name: Glenn [name] MRN: [id number] Date of Birth: 02/12/1956 Referring Provider:     [place] from 03/03/2018 in [place]  Referring Provider  [name]       Encounter Date: 03/24/2018  Check In: Session Check In - 03/24/18 0815      Check-In   Supervising physician immediately available to respond to emergencies  See telemetry face sheet for immediately available MD    Location  [facility]    Staff Present  Aundra Dubin, RN, Cory Munch, Exercise Physiologist;Diane Coad, MS, EP, [facility], Exercise Physiologist    Medication changes reported      No    Fall or balance concerns reported     No    Warm-up and Cool-down  Performed as group-led instruction    Resistance Training Performed  Yes    VAD Patient?  No    PAD/SET Patient?  No      Pain Assessment   Currently in Pain?  No/denies    Pain Score  0-No pain    Multiple Pain Sites  No       Capillary Blood Glucose: No results found for this or any previous visit (from the past 24 hour(s)).    Social History   Tobacco Use  Smoking Status Former Smoker  Smokeless Tobacco Never Used    Goals Met:  Independence with exercise equipment Exercise tolerated well No report of cardiac concerns or symptoms Strength training completed today  Goals Unmet:  Not Applicable  Comments: Pt able to follow exercise prescription today without complaint.  Will continue to monitor for progression. Check out 0915.   Dr. Kate Sable is Medical Director for [facility].

## 2018-03-27 ENCOUNTER — Encounter (HOSPITAL_COMMUNITY)
Admission: RE | Admit: 2018-03-27 | Discharge: 2018-03-27 | Disposition: A | Discharge status: Home / Self Care | Payer: 59 | Source: Ambulatory Visit | Attending: Cardiovascular Disease | Admitting: Cardiovascular Disease

## 2018-03-27 DIAGNOSIS — Z951 Presence of aortocoronary bypass graft: Secondary | ICD-10-CM | POA: Diagnosis not present

## 2018-03-27 NOTE — Progress Notes (Signed)
Cardiac Individual Treatment Plan  Patient Details  Name: Glenn [name] MRN: [id number] Date of Birth: 07-10-1955 Referring Provider:     [place] from 03/03/2018 in [place]  Referring Provider  [name]       Initial Encounter Date:    CARDIAC REHAB PHASE II ORIENTATION from 03/03/2018 in [place]  Date  03/03/18      Visit Diagnosis: S/P CABG x 5  Patient's Home Medications on Admission:  Current Outpatient Medications:  .  aspirin EC 81 MG tablet, Take 81 mg by mouth daily., Disp: , Rfl:  .  atorvastatin (LIPITOR) 80 MG tablet, Take 1 tablet (80 mg total) by mouth daily at 6 PM., Disp: 90 tablet, Rfl: 1 .  clopidogrel (PLAVIX) 75 MG tablet, Take 1 tablet (75 mg total) by mouth daily., Disp: 30 tablet, Rfl: 2 .  escitalopram (LEXAPRO) 20 MG tablet, Take 20 mg by mouth daily., Disp: , Rfl:  .  lisinopril (PRINIVIL,ZESTRIL) 2.5 MG tablet, Take 1 tablet (2.5 mg total) by mouth daily., Disp: 90 tablet, Rfl: 1 .  Menthol-Methyl Salicylate (BENGAY GREASELESS EX), Apply 1 application topically as needed (shoulder pain)., Disp: , Rfl:  .  metoprolol succinate (TOPROL XL) 25 MG 24 hr tablet, Take 1 tablet (25 mg total) by mouth daily., Disp: 90 tablet, Rfl: 3 .  multivitamin-iron-minerals-folic acid (CENTRUM) chewable tablet, Chew 1 tablet by mouth daily., Disp: , Rfl:   Past Medical History: Past Medical History:  Diagnosis Date  . CAD (coronary artery disease)    a. 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 Essential hypertension   . Hyperlipidemia   . Impaired fasting glucose   . Other fatigue   . Palpitations     Tobacco Use: Social History   Tobacco Use  Smoking Status Former Smoker  Smokeless Tobacco Never Used    Labs: Recent Clinical biochemist for [facility]iac and Pulmonary Rehab Latest Ref Rng & Units 12/23/2017 12/23/2017 12/23/2017 12/23/2017 12/24/2017   Cholestrol 0 - 200 mg/dL - - - - -   LDLCALC 0 - 99 mg/dL - - - - -   HDL >40 mg/dL - - - - -   Trlycerides <150 mg/dL - - - - -   Hemoglobin A1c 4.8 - 5.6 % - - - - -   PHART 7.350 - 7.450 7.335(L) 7.279(L) 7.339(L) - -   PCO2ART 32.0 - 48.0 mmHg 40.8 39.2 39.6 - -   HCO3 20.0 - 28.0 mmol/L 22.0 18.1(L) 21.5 - -   TCO2 22 - 32 mmol/L 23 19(L) 23 20(L) 23   ACIDBASEDEF 0.0 - 2.0 mmol/L 4.0(H) 8.0(H) 4.0(H) - -   O2SAT % 99.0 99.0 99.0 - -      Capillary Blood Glucose: Lab Results  Component Value Date   GLUCAP 89 12/28/2017   GLUCAP 89 12/27/2017   GLUCAP 85 12/27/2017   GLUCAP 81 12/27/2017   GLUCAP 80 12/27/2017     Exercise Target Goals: Exercise Program Goal: Individual exercise prescription set using results from initial 6 min walk test and THRR while considering  patient's activity barriers and safety.   Exercise Prescription Goal: Starting with aerobic activity 30 plus minutes a day, 3 days per week for initial exercise prescription. Provide home exercise prescription and guidelines that  participant acknowledges understanding prior to discharge.  Activity Barriers & Risk Stratification: Activity Barriers & Cardiac Risk Stratification - 03/03/18 1016      Activity Barriers & Cardiac Risk Stratification   Activity Barriers  Other (comment)    Comments  L shoulder pain     Cardiac Risk Stratification  High       6 Minute Walk: 6 Minute Walk    Row Name 03/03/18 1015         6 Minute Walk   Phase  Initial     Distance  1450 feet     Walk Time  6 minutes     # of Rest Breaks  0     MPH  2.75     METS  3.1     RPE  9     Perceived Dyspnea   7     VO2 Peak  12.03     Symptoms  No     Resting HR  63 bpm     Resting BP  110/60     Resting Oxygen Saturation   98 %     Exercise Oxygen Saturation  during 6 min walk  98 %      Max Ex. HR  83 bpm     Max Ex. BP  122/62     2 Minute Post BP  114/60        Oxygen Initial Assessment:   Oxygen Re-Evaluation:   Oxygen Discharge (Final Oxygen Re-Evaluation):   Initial Exercise Prescription: Initial Exercise Prescription - 03/03/18 1000      Date of Initial Exercise RX and Referring Provider   Date  03/03/18    Referring Provider  [name]     Expected Discharge Date  06/03/18      Treadmill   MPH  1.4    Grade  0    Minutes  17    METs  2.07      NuStep   Level  1    SPM  108    Minutes  17    METs  2.4      Prescription Details   Frequency (times per week)  3    Duration  Progress to 30 minutes of continuous aerobic without signs/symptoms of physical distress      Intensity   THRR 40-80% of Max Heartrate  100-119-138    Ratings of Perceived Exertion  11-13    Perceived Dyspnea  0-4      Progression   Progression  Continue to progress workloads to maintain intensity without signs/symptoms of physical distress.      Resistance Training   Training Prescription  Yes    Weight  1    Reps  10-15       Perform Capillary Blood Glucose checks as needed.  Exercise Prescription Changes:  Exercise Prescription Changes    Row Name 03/03/18 1000 03/14/18 1000 03/27/18 1400         Response to Exercise   Blood Pressure (Admit)  -  114/68  106/62     Blood Pressure (Exercise)  -  144/60  158/78     Blood Pressure (Exit)  -  112/60  98/62     Heart Rate (Admit)  -  51 bpm  60 bpm     Heart Rate (Exercise)  -  105 bpm  98 bpm     Heart Rate (Exit)  -  60 bpm  73 bpm     Rating  of Perceived Exertion (Exercise)  -  11  11     Comments  -  First full week of exercise!   Increase in overall MET level      Duration  -  Progress to 30 minutes of  aerobic without signs/symptoms of physical distress  Progress to 30 minutes of  aerobic without signs/symptoms of physical distress     Intensity  -  THRR New 100-119-138  THRR unchanged        Progression   Progression  -  Continue to progress workloads to maintain intensity without signs/symptoms of physical distress.  Continue to progress workloads to maintain intensity without signs/symptoms of physical distress.     Average METs  -  2.45  3.9       Resistance Training   Training Prescription  -  Yes  Yes     Weight  -  1  2     Reps  -  10-15  10-15       Treadmill   MPH  -  1.8  2.5     Grade  -  0  0     Minutes  -  17  17     METs  -  2.3  2.9       NuStep   Level  -  1  - switched to XR      SPM  -  115  -     Minutes  -  17  -     METs  -  2.6  -       Recumbant Elliptical   Level  -  -  1     RPM  -  -  64     Watts  -  -  92     Minutes  -  -  22     METs  -  -  4.9       Home Exercise Plan   Plans to continue exercise at  Home (comment)  Home (comment)  Home (comment)     Frequency  Add 2 additional days to program exercise sessions.  Add 2 additional days to program exercise sessions.  Add 2 additional days to program exercise sessions.     Initial Home Exercises Provided  03/03/18  03/03/18  03/03/18        Exercise Comments:  Exercise Comments    Row Name 03/27/18 1451           Exercise Comments  Pt. has attended 10 sessions so far. He is eager to be here and increase his strength to get back to work. He has done well in the program and is tolerating the exercise with ease.           Exercise Goals and Review:  Exercise Goals    Row Name 03/03/18 1018             Exercise Goals   Increase Physical Activity  Yes       Intervention  Provide advice, education, support and counseling about physical activity/exercise needs.;Develop an individualized exercise prescription for aerobic and resistive training based on initial evaluation findings, risk stratification, comorbidities and participant's personal goals.       Expected Outcomes  Short Term: Attend rehab on a regular basis to increase amount of physical activity.;Long Term: Add in  home exercise to make exercise part of routine and to increase amount of physical activity.;Long Term: Exercising regularly  at least 3-5 days a week.       Increase Strength and Stamina  Yes       Intervention  Provide advice, education, support and counseling about physical activity/exercise needs.;Develop an individualized exercise prescription for aerobic and resistive training based on initial evaluation findings, risk stratification, comorbidities and participant's personal goals.       Expected Outcomes  Short Term: Increase workloads from initial exercise prescription for resistance, speed, and METs.       Able to understand and use rate of perceived exertion (RPE) scale  Yes       Intervention  Provide education and explanation on how to use RPE scale       Expected Outcomes  Short Term: Able to use RPE daily in rehab to express subjective intensity level;Long Term:  Able to use RPE to guide intensity level when exercising independently       Able to understand and use Dyspnea scale  Yes       Intervention  Provide education and explanation on how to use Dyspnea scale       Expected Outcomes  Short Term: Able to use Dyspnea scale daily in rehab to express subjective sense of shortness of breath during exertion;Long Term: Able to use Dyspnea scale to guide intensity level when exercising independently       Knowledge and understanding of Target Heart Rate Range (THRR)  Yes       Intervention  Provide education and explanation of THRR including how the numbers were predicted and where they are located for reference       Expected Outcomes  Short Term: Able to state/look up THRR;Long Term: Able to use THRR to govern intensity when exercising independently;Short Term: Able to use daily as guideline for intensity in rehab       Able to check pulse independently  Yes       Intervention  Provide education and demonstration on how to check pulse in carotid and radial arteries.;Review the importance of  being able to check your own pulse for safety during independent exercise       Expected Outcomes  Short Term: Able to explain why pulse checking is important during independent exercise;Long Term: Able to check pulse independently and accurately       Understanding of Exercise Prescription  Yes       Intervention  Provide education, explanation, and written materials on patient's individual exercise prescription       Expected Outcomes  Short Term: Able to explain program exercise prescription;Long Term: Able to explain home exercise prescription to exercise independently          Exercise Goals Re-Evaluation : Exercise Goals Re-Evaluation    Row Name 03/27/18 1450             Exercise Goal Re-Evaluation   Exercise Goals Review  Increase Physical Activity;Increase Strength and Stamina;Able to understand and use rate of perceived exertion (RPE) scale;Knowledge and understanding of Target Heart Rate Range (THRR);Able to check pulse independently;Understanding of Exercise Prescription       Comments  Pt. has done excellent in the program so far. He has tolerated all exercise and increases well. We will continue to progress as we see fit.        Expected Outcomes  Increase strength to get back to work.            Discharge Exercise Prescription (Final Exercise Prescription Changes): Exercise Prescription Changes - 03/27/18 1400  Response to Exercise   Blood Pressure (Admit)  106/62    Blood Pressure (Exercise)  158/78    Blood Pressure (Exit)  98/62    Heart Rate (Admit)  60 bpm    Heart Rate (Exercise)  98 bpm    Heart Rate (Exit)  73 bpm    Rating of Perceived Exertion (Exercise)  11    Comments  Increase in overall MET level     Duration  Progress to 30 minutes of  aerobic without signs/symptoms of physical distress    Intensity  THRR unchanged      Progression   Progression  Continue to progress workloads to maintain intensity without signs/symptoms of physical distress.     Average METs  3.9      Resistance Training   Training Prescription  Yes    Weight  2    Reps  10-15      Treadmill   MPH  2.5    Grade  0    Minutes  17    METs  2.9      NuStep   Level  --   switched to XR      Recumbant Elliptical   Level  1    RPM  64    Watts  92    Minutes  22    METs  4.9      Home Exercise Plan   Plans to continue exercise at  Home (comment)    Frequency  Add 2 additional days to program exercise sessions.    Initial Home Exercises Provided  03/03/18       Nutrition:  Target Goals: Understanding of nutrition guidelines, daily intake of sodium <152m, cholesterol <2066m calories 30% from fat and 7% or less from saturated fats, daily to have 5 or more servings of fruits and vegetables.  Biometrics: Pre Biometrics - 03/03/18 1018      Pre Biometrics   Height  _0  (1.753 m)    Weight  80.5 kg    Waist Circumference  37.5 inches    Hip Circumference  40 inches    Waist to Hip Ratio  0.94 %    BMI (Calculated)  26.2    Triceps Skinfold  6 mm    % Body Fat  21.9 %    Grip Strength  17.6 kg    Flexibility  12.6 in    Single Leg Stand  2.47 seconds        Nutrition Therapy Plan and Nutrition Goals: Nutrition Therapy & Goals - 03/27/18 1502      Nutrition Therapy   RD appointment deferred  Yes      Personal Nutrition Goals   Comments  Patient was no able to make the RD appointment. He hopes to attend in January. Will continue to montior for progress.       Intervention Plan   Intervention  Nutrition handout(s) given to patient.       Nutrition Assessments: Nutrition Assessments - 03/03/18 0954      MEDFICTS Scores   Pre Score  9       Nutrition Goals Re-Evaluation:   Nutrition Goals Discharge (Final Nutrition Goals Re-Evaluation):   Psychosocial: Target Goals: Acknowledge presence or absence of significant depression and/or stress, maximize coping skills, provide positive support system. Participant is able to  verbalize types and ability to use techniques and skills needed for reducing stress and depression.  Initial Review & Psychosocial Screening: Initial Psych Review & Screening -  03/03/18 [place]?  Yes      Barriers   Psychosocial barriers to participate in program  There are no identifiable barriers or psychosocial needs.      Screening Interventions   Interventions  Encouraged to exercise       Quality of Life Scores: Quality of Life - 03/03/18 1020      Quality of Life   Select  Quality of Life      Quality of Life Scores   Health/Function Pre  26 %    Socioeconomic Pre  30 %    Psych/Spiritual Pre  30 %    Family Pre  30 %    GLOBAL Pre  28.29 %      Scores of 19 and below usually indicate a poorer quality of life in these areas.  A difference of  2-3 points is a clinically meaningful difference.  A difference of 2-3 points in the total score of the Quality of Life Index has been associated with significant improvement in overall quality of life, self-image, physical symptoms, and general health in studies assessing change in quality of life.  PHQ-9: Recent Review Flowsheet Data    Depression screen [name] 2/9 03/03/2018   Decreased Interest 0   Down, Depressed, Hopeless 0   PHQ - 2 Score 0   Altered sleeping 0   Tired, decreased energy 1   Change in appetite 0   Feeling bad or failure about yourself  0   Trouble concentrating 0   Moving slowly or fidgety/restless 0   Suicidal thoughts 0   PHQ-9 Score 1   Difficult doing work/chores Somewhat difficult     Interpretation of Total Score  Total Score Depression Severity:  1-4 = Minimal depression, 5-9 = Mild depression, 10-14 = Moderate depression, 15-19 = Moderately severe depression, 20-27 = Severe depression   Psychosocial Evaluation and Intervention: Psychosocial Evaluation - 03/03/18 1027      Psychosocial Evaluation & Interventions   Interventions  Stress management  education;Relaxation education;Encouraged to exercise with the program and follow exercise prescription    Comments  Patient has no psychosocial barriers identified at orientation. His initial QOL score was 28.29 and his PHQ-9 score was 1.    Expected Outcomes  Patient will have no psychosocial issues identified at discharge.     Continue Psychosocial Services   No Follow up required       Psychosocial Re-Evaluation: Psychosocial Re-Evaluation    [place] Name 03/27/18 1505             Psychosocial Re-Evaluation   Current issues with  None Identified       Comments  Patient's initial QOL score was 28.29 and his PHQ-9 1 with no psychosocial issues identified.        Expected Outcomes  Patient will have no psychosocial issues identified at discharge.        Interventions  Stress management education;Relaxation education;Encouraged to attend Cardiac Rehabilitation for the exercise       Continue Psychosocial Services   No Follow up required          Psychosocial Discharge (Final Psychosocial Re-Evaluation): Psychosocial Re-Evaluation - 03/27/18 1505      Psychosocial Re-Evaluation   Current issues with  None Identified    Comments  Patient's initial QOL score was 28.29 and his PHQ-9 1 with no psychosocial issues identified.     Expected Outcomes  Patient will have no  psychosocial issues identified at discharge.     Interventions  Stress management education;Relaxation education;Encouraged to attend Cardiac Rehabilitation for the exercise    Continue Psychosocial Services   No Follow up required       Vocational Rehabilitation: Provide vocational rehab assistance to qualifying candidates.   Vocational Rehab Evaluation & Intervention: Vocational Rehab - 03/03/18 0956      Initial Vocational Rehab Evaluation & Intervention   Assessment shows need for Vocational Rehabilitation  No       Education: Education Goals: Education classes will be provided on a weekly basis, covering  required topics. Participant will state understanding/return demonstration of topics presented.  Learning Barriers/Preferences: Learning Barriers/Preferences - 03/03/18 0954      Learning Barriers/Preferences   Learning Barriers  None    Learning Preferences  Skilled Demonstration;Pictoral       Education Topics: Hypertension, Hypertension Reduction -Define heart disease and high blood pressure. Discus how high blood pressure affects the body and ways to reduce high blood pressure.   Exercise and Your Heart -Discuss why it is important to exercise, the FITT principles of exercise, normal and abnormal responses to exercise, and how to exercise safely.   Angina -Discuss definition of angina, causes of angina, treatment of angina, and how to decrease risk of having angina.   Cardiac Medications -Review what the following cardiac medications are used for, how they affect the body, and side effects that may occur when taking the medications.  Medications include Aspirin, Beta blockers, calcium channel blockers, ACE Inhibitors, angiotensin receptor blockers, diuretics, digoxin, and antihyperlipidemics.   CARDIAC REHAB PHASE II EXERCISE from 03/22/2018 in [place]  Date  03/08/18  Educator  Etheleen Mayhew  Instruction Review Code  2- Demonstrated Understanding      Congestive Heart Failure -Discuss the definition of CHF, how to live with CHF, the signs and symptoms of CHF, and how keep track of weight and sodium intake.   CARDIAC REHAB PHASE II EXERCISE from 03/22/2018 in [place]  Date  03/15/18  Educator  Wynetta Emery  Instruction Review Code  2- Demonstrated Understanding      Heart Disease and Intimacy -Discus the effect sexual activity has on the heart, how changes occur during intimacy as we age, and safety during sexual activity.   CARDIAC REHAB PHASE II EXERCISE from 03/22/2018 in [place]  Date  03/22/18   Educator  Etheleen Mayhew  Instruction Review Code  2- Demonstrated Understanding      Smoking Cessation / COPD -Discuss different methods to quit smoking, the health benefits of quitting smoking, and the definition of COPD.   Nutrition I: Fats -Discuss the types of cholesterol, what cholesterol does to the heart, and how cholesterol levels can be controlled.   Nutrition II: Labels -Discuss the different components of food labels and how to read food label   Heart Parts/Heart Disease and PAD -Discuss the anatomy of the heart, the pathway of blood circulation through the heart, and these are affected by heart disease.   Stress I: Signs and Symptoms -Discuss the causes of stress, how stress may lead to anxiety and depression, and ways to limit stress.   Stress II: Relaxation -Discuss different types of relaxation techniques to limit stress.   Warning Signs of Stroke / TIA -Discuss definition of a stroke, what the signs and symptoms are of a stroke, and how to identify when someone is having stroke.   Knowledge Questionnaire Score: Knowledge Questionnaire Score -  03/03/18 0955      Knowledge Questionnaire Score   Pre Score  19/24       Core Components/Risk Factors/Patient Goals at Admission: Personal Goals and Risk Factors at Admission - 03/03/18 0956      Core Components/Risk Factors/Patient Goals on Admission    Weight Management  Weight Maintenance    Personal Goal Other  Yes    Personal Goal  Patient wants to get healthier and get back to work. He wants to increase his strength and stamina.     Intervention  Patient will attend CR 3 days/week and supplement with exercise 2 days/week.    Expected Outcomes  Patient will complete the program meeting his personal goals.        Core Components/Risk Factors/Patient Goals Review:  Goals and Risk Factor Review    Row Name 03/27/18 1503             Core Components/Risk Factors/Patient Goals Review   Personal Goals  Review  Other Get healthier; get back to work; increase strength and stamina.        Review  Patient has completed 10 sessions gaining 4 lbs since his initial visit. He is doing well in the program with progression. He says he does feel stronger and has more endurance. He hopes to continue to meet his goals as he continues the program. Will continue to monitor for progress.        Expected Outcomes  Patient will continue to attend sessions and complete the program meeting his personal goals.           Core Components/Risk Factors/Patient Goals at Discharge (Final Review):  Goals and Risk Factor Review - 03/27/18 1503      Core Components/Risk Factors/Patient Goals Review   Personal Goals Review  Other   Get healthier; get back to work; increase strength and stamina.    Review  Patient has completed 10 sessions gaining 4 lbs since his initial visit. He is doing well in the program with progression. He says he does feel stronger and has more endurance. He hopes to continue to meet his goals as he continues the program. Will continue to monitor for progress.     Expected Outcomes  Patient will continue to attend sessions and complete the program meeting his personal goals.        ITP Comments: ITP Comments    Row Name 03/03/18 0940 03/13/18 1010         ITP Comments  Patient is a 62 year old male referred by Dr. [name] for S/P CABG x 5. He has a h/o hyperlipidemia and is a former smoker. He has no barriers to CR identified at orientation.   Patient is new to program. He has completed 3 sessions. Will continue to monitor for progress.          Comments: ITP REVIEW Patient doing well in the program. Will continue to monitor for progress.

## 2018-03-27 NOTE — Progress Notes (Signed)
Daily Session Note  Patient Details  Name: JOB HOLTSCLAW MRN: [id number] Date of Birth: 06/08/1955 Referring Provider:     [place] from 03/03/2018 in [place]  Referring Provider  [name]       Encounter Date: 03/27/2018  Check In: Session Check In - 03/27/18 0815      Check-In   Supervising physician immediately available to respond to emergencies  See telemetry face sheet for immediately available MD    Location  [facility]    Staff Present  Aundra Dubin, RN, Cory Munch, Exercise Physiologist;Diane Coad, MS, EP, [facility], Exercise Physiologist    Medication changes reported      No    Fall or balance concerns reported     No    Warm-up and Cool-down  Performed as group-led instruction    Resistance Training Performed  Yes    VAD Patient?  No    PAD/SET Patient?  No      Pain Assessment   Currently in Pain?  No/denies    Pain Score  0-No pain    Multiple Pain Sites  No       Capillary Blood Glucose: No results found for this or any previous visit (from the past 24 hour(s)).    Social History   Tobacco Use  Smoking Status Former Smoker  Smokeless Tobacco Never Used    Goals Met:  Independence with exercise equipment Exercise tolerated well No report of cardiac concerns or symptoms Strength training completed today  Goals Unmet:  Not Applicable  Comments: Pt able to follow exercise prescription today without complaint.  Will continue to monitor for progression. CHeck out 915.   Dr. Kate Sable is Medical Director for [facility] Cardiac and Pulmonary Rehab.

## 2018-03-29 ENCOUNTER — Encounter: Payer: Self-pay | Admitting: Cardiothoracic Surgery

## 2018-03-29 ENCOUNTER — Encounter (HOSPITAL_COMMUNITY): Payer: 59

## 2018-03-29 ENCOUNTER — Ambulatory Visit (INDEPENDENT_AMBULATORY_CARE_PROVIDER_SITE_OTHER): Payer: 59 | Admitting: Cardiothoracic Surgery

## 2018-03-29 ENCOUNTER — Encounter (HOSPITAL_COMMUNITY)
Admission: RE | Admit: 2018-03-29 | Discharge: 2018-03-29 | Disposition: A | Discharge status: Home / Self Care | Payer: 59 | Source: Ambulatory Visit | Attending: Cardiovascular Disease | Admitting: Cardiovascular Disease

## 2018-03-29 VITALS — BP 120/74 | HR 60 | Resp 20 | Ht 69.0 in | Wt 179.0 lb

## 2018-03-29 DIAGNOSIS — Z951 Presence of aortocoronary bypass graft: Secondary | ICD-10-CM | POA: Diagnosis not present

## 2018-03-29 DIAGNOSIS — I2510 Atherosclerotic heart disease of native coronary artery without angina pectoris: Secondary | ICD-10-CM

## 2018-03-29 NOTE — Progress Notes (Signed)
Daily Session Note  Patient Details  Name: Glenn [name] MRN: [id number] Date of Birth: [birth year] Referring Provider:     [place] from 03/03/2018 in [place]  Referring Provider  [name]       Encounter Date: 03/29/2018  Check In: Session Check In - 03/29/18 1545      Check-In   Supervising physician immediately available to respond to emergencies  See telemetry face sheet for immediately available MD    Location  [facility]    Staff Present  Aundra Dubin, RN, Cory Munch, Exercise Physiologist    Medication changes reported      No    Fall or balance concerns reported     No    Warm-up and Cool-down  Performed as group-led instruction    Resistance Training Performed  Yes    VAD Patient?  No    PAD/SET Patient?  No      Pain Assessment   Currently in Pain?  No/denies    Pain Score  0-No pain    Multiple Pain Sites  No       Capillary Blood Glucose: No results found for this or any previous visit (from the past 24 hour(s)).    Social History   Tobacco Use  Smoking Status Former Smoker  Smokeless Tobacco Never Used    Goals Met:  Independence with exercise equipment Exercise tolerated well No report of cardiac concerns or symptoms Strength training completed today  Goals Unmet:  Not Applicable  Comments: Pt able to follow exercise prescription today without complaint.  Will continue to monitor for progression. Check out 1645.   Dr. Kate Sable is Medical Director for [facility] Cardiac and Pulmonary Rehab.

## 2018-03-29 NOTE — Progress Notes (Signed)
PCP is Benita Stabile, MD Referring Provider is Laqueta Linden, MD  Chief Complaint  Patient presents with  . Routine Post Op    2 month f/u, discuss return to work , HX of CABG     HPI: Routine follow-up 3 months after emergency CABG x5 for V. fib arrest associated with STEMI.  Patient had preop EF of 25%.  He recovered from surgery.  He is now starting his outpatient cardiac rehab at [facility].  An echocardiogram performed last week shows EF now 40-45% without significant MR.  He is in sinus rhythm.  He denies symptoms of angina or heart failure and he is on aspirin Plavix Crestor  lisinopril and Toprol-XL.  Last chest x-ray is clear.  He denies any clicking or popping sensation in the sternal incision.  Past Medical History:  Diagnosis Date  . CAD (coronary artery disease)    a. 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 Essential hypertension   . Hyperlipidemia   . Impaired fasting glucose   . Other fatigue   . Palpitations     Past Surgical History:  Procedure Laterality Date  . CORONARY ARTERY BYPASS GRAFT N/A 12/23/2017   Procedure: CORONARY ARTERY BYPASS GRAFTING (CABG) x Five , using left internal mammary artery and right leg greater saphenous vein harvested endoscopically;  Surgeon: Kerin Perna, MD;  Location: [facility] OR;  Service: Open Heart Surgery;  Laterality: N/A;  . LEFT HEART CATH AND CORONARY ANGIOGRAPHY N/A 12/22/2017   Procedure: LEFT HEART CATH AND CORONARY ANGIOGRAPHY;  Surgeon: Yvonne Kendall, MD;  Location: [facility] INVASIVE CV LAB;  Service: Cardiovascular;  Laterality: N/A;  . SHOULDER ACROMIOPLASTY  07/01/2017  . TEE WITHOUT CARDIOVERSION N/A 12/23/2017   Procedure: TRANSESOPHAGEAL ECHOCARDIOGRAM (TEE);  Surgeon: Donata Clay, Theron Arista, MD;  Location: [facility] OR;  Service: Open Heart Surgery;  Laterality: N/A;     Family History  Problem Relation Age of Onset  . Diabetes Mother   . Hypertension Mother   . Heart disease Mother   . Cancer Father   . Heart disease Brother   . CAD Brother 76    Social History Social History   Tobacco Use  . Smoking status: Former Games developer  . Smokeless tobacco: Never Used  Substance Use Topics  . Alcohol use: Yes    Comment: on occasion  . Drug use: No    Current Outpatient Medications  Medication Sig Dispense Refill  . aspirin EC 81 MG tablet Take 81 mg by mouth daily.    Marland Kitchen atorvastatin (LIPITOR) 80 MG tablet Take 1 tablet (80 mg total) by mouth daily at 6 PM. 90 tablet 1  . clopidogrel (PLAVIX) 75 MG tablet Take 1 tablet (75 mg total) by mouth daily. 30 tablet 2  . escitalopram (LEXAPRO) 20 MG tablet Take 20 mg by mouth daily.    Marland Kitchen lisinopril (PRINIVIL,ZESTRIL) 2.5 MG tablet Take 1 tablet (2.5 mg total) by mouth daily. 90 tablet 1  . Menthol-Methyl Salicylate (BENGAY GREASELESS EX) Apply 1 application topically as needed (shoulder pain).    . metoprolol succinate (TOPROL XL) 25 MG 24 hr tablet Take 1 tablet (25 mg total) by mouth daily. 90 tablet 3  . multivitamin-iron-minerals-folic acid (CENTRUM) chewable tablet Chew 1 tablet by mouth daily.     No current facility-administered medications for this visit.     No  Known Allergies  Review of Systems  Still have some soreness in his chest Has not recovered full exercise capacity yet Lifting is been limited to 20 pounds or less  BP 120/74   Pulse 60   Resp 20   Ht 5\' 9"  (1.753 m)   Wt 179 lb (81.2 kg)   SpO2 97% Comment: RA  BMI 26.43 kg/m  Physical Exam   Diagnostic Tests:      Exam    General- alert and comfortable    Neck- no JVD, no cervical adenopathy palpable, no carotid bruit   Lungs- clear without rales, wheezes   Cor- regular rate and rhythm, no murmur , gallop   Abdomen- soft, non-tender   Extremities - warm, non-tender, minimal edema   Neuro- oriented, appropriate, no  focal weakness   Impression: Doing well almost 3 months postop emergency CABG after V. fib arrest-MI He needs to finish his cardiac rehab program before he is ready to return to work which is a fairly physical job.  He should continue his current medications. He can now lift up to 40 pounds if necessary.  Plan: I will see the patient back in about 3 months to discuss return to work after he finishes his cardiac rehab program.   Mikey Bussing, MD [facility] [phone]

## 2018-03-31 ENCOUNTER — Encounter (HOSPITAL_COMMUNITY)
Admission: RE | Admit: 2018-03-31 | Discharge: 2018-03-31 | Disposition: A | Discharge status: Home / Self Care | Payer: 59 | Source: Ambulatory Visit | Attending: Cardiovascular Disease | Admitting: Cardiovascular Disease

## 2018-03-31 DIAGNOSIS — Z951 Presence of aortocoronary bypass graft: Secondary | ICD-10-CM

## 2018-03-31 NOTE — Progress Notes (Signed)
Daily Session Note  Patient Details  Name: LYNDEN FLEMMER MRN: [id number] Date of Birth: [birth year] Referring Provider:     [place] from 03/03/2018 in [place]  Referring Provider  [name]       Encounter Date: 03/31/2018  Check In: Session Check In - 03/31/18 0815      Check-In   Supervising physician immediately available to respond to emergencies  See telemetry face sheet for immediately available MD    Location  [facility]    Staff Present  Aundra Dubin, RN, Cory Munch, Exercise Physiologist    Medication changes reported      No    Fall or balance concerns reported     No    Warm-up and Cool-down  Performed as group-led instruction    Resistance Training Performed  Yes    VAD Patient?  No    PAD/SET Patient?  No      Pain Assessment   Currently in Pain?  No/denies    Pain Score  0-No pain    Multiple Pain Sites  No       Capillary Blood Glucose: No results found for this or any previous visit (from the past 24 hour(s)).    Social History   Tobacco Use  Smoking Status Former Smoker  Smokeless Tobacco Never Used    Goals Met:  Independence with exercise equipment Exercise tolerated well No report of cardiac concerns or symptoms Strength training completed today  Goals Unmet:  Not Applicable  Comments: Pt able to follow exercise prescription today without complaint.  Will continue to monitor for progression. Check out 915.   Dr. Kate Sable is Medical Director for [facility] Cardiac and Pulmonary Rehab.

## 2018-04-03 ENCOUNTER — Encounter (HOSPITAL_COMMUNITY)
Admission: RE | Admit: 2018-04-03 | Discharge: 2018-04-03 | Disposition: A | Discharge status: Home / Self Care | Payer: 59 | Source: Ambulatory Visit | Attending: Cardiovascular Disease | Admitting: Cardiovascular Disease

## 2018-04-03 DIAGNOSIS — Z951 Presence of aortocoronary bypass graft: Secondary | ICD-10-CM | POA: Diagnosis not present

## 2018-04-03 NOTE — Progress Notes (Signed)
Daily Session Note  Patient Details  Name: Glenn [name] MRN: [id number] Date of Birth: 04-04-1956 Referring Provider:     [place] from 03/03/2018 in [place]  Referring Provider  [name]       Encounter Date: 04/03/2018  Check In: Session Check In - 04/03/18 0815      Check-In   Supervising physician immediately available to respond to emergencies  See telemetry face sheet for immediately available MD    Location  [facility]    Staff Present  Aundra Dubin, RN, Cory Munch, Exercise Physiologist    Medication changes reported      No    Fall or balance concerns reported     No    Warm-up and Cool-down  Performed as group-led instruction    Resistance Training Performed  Yes    VAD Patient?  No    PAD/SET Patient?  No      Pain Assessment   Currently in Pain?  No/denies    Pain Score  0-No pain    Multiple Pain Sites  No       Capillary Blood Glucose: No results found for this or any previous visit (from the past 24 hour(s)).    Social History   Tobacco Use  Smoking Status Former Smoker  Smokeless Tobacco Never Used    Goals Met:  Independence with exercise equipment Exercise tolerated well No report of cardiac concerns or symptoms Strength training completed today  Goals Unmet:  Not Applicable  Comments: Pt able to follow exercise prescription today without complaint.  Will continue to monitor for progression. Check out 915.   Dr. Kate Sable is Medical Director for [facility] Cardiac and Pulmonary Rehab.

## 2018-04-05 ENCOUNTER — Encounter (HOSPITAL_COMMUNITY): Payer: 59

## 2018-04-07 ENCOUNTER — Encounter (HOSPITAL_COMMUNITY)
Admission: RE | Admit: 2018-04-07 | Discharge: 2018-04-07 | Disposition: A | Discharge status: Home / Self Care | Payer: 59 | Source: Ambulatory Visit | Attending: Cardiovascular Disease | Admitting: Cardiovascular Disease

## 2018-04-07 DIAGNOSIS — Z951 Presence of aortocoronary bypass graft: Secondary | ICD-10-CM

## 2018-04-07 NOTE — Progress Notes (Signed)
Daily Session Note  Patient Details  Name: LINDON KIEL MRN: [id number] Date of Birth: Sep 05, 1955 Referring Provider:     [place] from 03/03/2018 in [place]  Referring Provider  [name]       Encounter Date: 04/07/2018  Check In: Session Check In - 04/07/18 0815      Check-In   Supervising physician immediately available to respond to emergencies  See telemetry face sheet for immediately available MD    Location  [facility]    Staff Present  Benay Pike, Exercise Physiologist;Diane Coad, MS, EP, [facility], Exercise Physiologist;Other    Medication changes reported      No    Fall or balance concerns reported     No    Warm-up and Cool-down  Performed as group-led instruction    Resistance Training Performed  Yes    VAD Patient?  No    PAD/SET Patient?  No      Pain Assessment   Currently in Pain?  No/denies    Pain Score  0-No pain    Multiple Pain Sites  No       Capillary Blood Glucose: No results found for this or any previous visit (from the past 24 hour(s)).    Social History   Tobacco Use  Smoking Status Former Smoker  Smokeless Tobacco Never Used    Goals Met:  Independence with exercise equipment Exercise tolerated well No report of cardiac concerns or symptoms Strength training completed today  Goals Unmet:  Not Applicable  Comments: Pt able to follow exercise prescription today without complaint.  Will continue to monitor for progression. Check out 0915.   Dr. Kate Sable is Medical Director for [facility] Cardiac and Pulmonary Rehab.

## 2018-04-10 ENCOUNTER — Encounter (HOSPITAL_COMMUNITY)
Admission: RE | Admit: 2018-04-10 | Discharge: 2018-04-10 | Disposition: A | Discharge status: Home / Self Care | Payer: 59 | Source: Ambulatory Visit | Attending: Cardiovascular Disease | Admitting: Cardiovascular Disease

## 2018-04-10 DIAGNOSIS — Z951 Presence of aortocoronary bypass graft: Secondary | ICD-10-CM | POA: Diagnosis not present

## 2018-04-10 NOTE — Progress Notes (Signed)
Daily Session Note  Patient Details  Name: Glenn [name] MRN: [id number] Date of Birth: May 03, 1955 Referring Provider:     [place] from 03/03/2018 in [place]  Referring Provider  [name]       Encounter Date: 04/10/2018  Check In: Session Check In - 04/10/18 0815      Check-In   Supervising physician immediately available to respond to emergencies  See telemetry face sheet for immediately available MD    Location  [facility]    Staff Present  Benay Pike, Exercise Physiologist;Diane Coad, MS, EP, [facility], Exercise Physiologist;Other    Medication changes reported      No    Fall or balance concerns reported     No    Warm-up and Cool-down  Performed as group-led instruction    Resistance Training Performed  Yes    VAD Patient?  No    PAD/SET Patient?  No      Pain Assessment   Currently in Pain?  No/denies    Pain Score  0-No pain    Multiple Pain Sites  No       Capillary Blood Glucose: No results found for this or any previous visit (from the past 24 hour(s)).    Social History   Tobacco Use  Smoking Status Former Smoker  Smokeless Tobacco Never Used    Goals Met:  Independence with exercise equipment Exercise tolerated well No report of cardiac concerns or symptoms Strength training completed today  Goals Unmet:  Not Applicable  Comments: Pt able to follow exercise prescription today without complaint.  Will continue to monitor for progression. Check out 9015.   Dr. Kate Sable is Medical Director for [facility] Cardiac and Pulmonary Rehab.

## 2018-04-12 ENCOUNTER — Encounter (HOSPITAL_COMMUNITY): Payer: 59

## 2018-04-14 ENCOUNTER — Encounter (HOSPITAL_COMMUNITY)
Admission: RE | Admit: 2018-04-14 | Discharge: 2018-04-14 | Disposition: A | Discharge status: Home / Self Care | Payer: 59 | Source: Ambulatory Visit | Attending: Cardiovascular Disease | Admitting: Cardiovascular Disease

## 2018-04-14 DIAGNOSIS — Z951 Presence of aortocoronary bypass graft: Secondary | ICD-10-CM | POA: Insufficient documentation

## 2018-04-14 NOTE — Progress Notes (Signed)
Daily Session Note  Patient Details  Name: Glenn [name] MRN: [id number] Date of Birth: Oct 13, 1955 Referring Provider:     [name] from 03/03/2018 in [place]  Referring Provider  [name]       Encounter Date: 04/14/2018  Check In: Session Check In - 04/14/18 0846      Check-In   Supervising physician immediately available to respond to emergencies  See telemetry face sheet for immediately available MD    Location  [facility]    Staff Present  Benay Pike, Exercise Physiologist;Diane Coad, MS, EP, [facility], Exercise Physiologist;Debra Wynetta Emery, RN, BSN    Medication changes reported      No    Fall or balance concerns reported     No    Warm-up and Cool-down  Performed as group-led instruction    Resistance Training Performed  Yes    VAD Patient?  No    PAD/SET Patient?  No      Pain Assessment   Currently in Pain?  No/denies    Pain Score  0-No pain    Multiple Pain Sites  No       Capillary Blood Glucose: No results found for this or any previous visit (from the past 24 hour(s)).    Social History   Tobacco Use  Smoking Status Former Smoker  Smokeless Tobacco Never Used    Goals Met:  Independence with exercise equipment Exercise tolerated well No report of cardiac concerns or symptoms Strength training completed today  Goals Unmet:  Not Applicable  Comments: Pt able to follow exercise prescription today without complaint.  Will continue to monitor for progression. Check out 0915.   Dr. Kate Sable is Medical Director for [facility] Cardiac and Pulmonary Rehab.

## 2018-04-17 ENCOUNTER — Encounter (HOSPITAL_COMMUNITY)
Admission: RE | Admit: 2018-04-17 | Discharge: 2018-04-17 | Disposition: A | Discharge status: Home / Self Care | Payer: 59 | Source: Ambulatory Visit | Attending: Cardiovascular Disease | Admitting: Cardiovascular Disease

## 2018-04-17 DIAGNOSIS — Z951 Presence of aortocoronary bypass graft: Secondary | ICD-10-CM | POA: Diagnosis not present

## 2018-04-17 NOTE — Progress Notes (Signed)
Daily Session Note  Patient Details  Name: Glenn [name] MRN: [id number] Date of Birth: Feb 24, 1956 Referring Provider:     [place] from 03/03/2018 in [place]  Referring Provider  [name]       Encounter Date: 04/17/2018  Check In: Session Check In - 04/17/18 0815      Check-In   Supervising physician immediately available to respond to emergencies  See telemetry face sheet for immediately available MD    Location  [facility]    Staff Present  Benay Pike, Exercise Physiologist;Diane Coad, MS, EP, [facility], Exercise Physiologist;Audri Kozub Wynetta Emery, RN, BSN    Medication changes reported      No    Fall or balance concerns reported     No    Warm-up and Cool-down  Performed as group-led instruction    Resistance Training Performed  Yes    VAD Patient?  No    PAD/SET Patient?  No      Pain Assessment   Currently in Pain?  No/denies    Pain Score  0-No pain    Multiple Pain Sites  No       Capillary Blood Glucose: No results found for this or any previous visit (from the past 24 hour(s)).    Social History   Tobacco Use  Smoking Status Former Smoker  Smokeless Tobacco Never Used    Goals Met:  Independence with exercise equipment Exercise tolerated well No report of cardiac concerns or symptoms Strength training completed today  Goals Unmet:  Not Applicable  Comments: Pt able to follow exercise prescription today without complaint.  Will continue to monitor for progression. Check out 915.   Dr. Kate Sable is Medical Director for [facility] Cardiac and Pulmonary Rehab.

## 2018-04-19 ENCOUNTER — Encounter (HOSPITAL_COMMUNITY)
Admission: RE | Admit: 2018-04-19 | Discharge: 2018-04-19 | Disposition: A | Discharge status: Home / Self Care | Payer: 59 | Source: Ambulatory Visit | Attending: Cardiovascular Disease | Admitting: Cardiovascular Disease

## 2018-04-19 DIAGNOSIS — Z951 Presence of aortocoronary bypass graft: Secondary | ICD-10-CM

## 2018-04-19 NOTE — Progress Notes (Signed)
Daily Session Note  Patient Details  Name: Glenn [name] MRN: [id number] Date of Birth: 29-Oct-1955 Referring Provider:     [place] from 03/03/2018 in [place]  Referring Provider  [name]       Encounter Date: 04/19/2018  Check In: Session Check In - 04/19/18 0815      Check-In   Supervising physician immediately available to respond to emergencies  See telemetry face sheet for immediately available MD    Location  [facility]    Staff Present  Benay Pike, Exercise Physiologist;Diane Coad, MS, EP, [facility], Exercise Physiologist;Debra Wynetta Emery, RN, BSN    Medication changes reported      No    Fall or balance concerns reported     No    Warm-up and Cool-down  Performed as group-led instruction    Resistance Training Performed  Yes    VAD Patient?  No    PAD/SET Patient?  No      Pain Assessment   Currently in Pain?  No/denies    Pain Score  0-No pain    Multiple Pain Sites  No       Capillary Blood Glucose: No results found for this or any previous visit (from the past 24 hour(s)).    Social History   Tobacco Use  Smoking Status Former Smoker  Smokeless Tobacco Never Used    Goals Met:  Independence with exercise equipment Exercise tolerated well No report of cardiac concerns or symptoms Strength training completed today  Goals Unmet:  Not Applicable  Comments: Pt able to follow exercise prescription today without complaint.  Will continue to monitor for progression. Check out 0915.   Dr. Kate Sable is Medical Director for [facility] Cardiac and Pulmonary Rehab.

## 2018-04-20 NOTE — Progress Notes (Signed)
Cardiac Individual Treatment Plan  Patient Details  Name: Glenn [name] MRN: [id number] Date of Birth: 07-10-1955 Referring Provider:     [place] from 03/03/2018 in [place]  Referring Provider  [name]       Initial Encounter Date:    CARDIAC REHAB PHASE II ORIENTATION from 03/03/2018 in [place]  Date  03/03/18      Visit Diagnosis: S/P CABG x 5  Patient's Home Medications on Admission:  Current Outpatient Medications:  .  aspirin EC 81 MG tablet, Take 81 mg by mouth daily., Disp: , Rfl:  .  atorvastatin (LIPITOR) 80 MG tablet, Take 1 tablet (80 mg total) by mouth daily at 6 PM., Disp: 90 tablet, Rfl: 1 .  clopidogrel (PLAVIX) 75 MG tablet, Take 1 tablet (75 mg total) by mouth daily., Disp: 30 tablet, Rfl: 2 .  escitalopram (LEXAPRO) 20 MG tablet, Take 20 mg by mouth daily., Disp: , Rfl:  .  lisinopril (PRINIVIL,ZESTRIL) 2.5 MG tablet, Take 1 tablet (2.5 mg total) by mouth daily., Disp: 90 tablet, Rfl: 1 .  Menthol-Methyl Salicylate (BENGAY GREASELESS EX), Apply 1 application topically as needed (shoulder pain)., Disp: , Rfl:  .  metoprolol succinate (TOPROL XL) 25 MG 24 hr tablet, Take 1 tablet (25 mg total) by mouth daily., Disp: 90 tablet, Rfl: 3 .  multivitamin-iron-minerals-folic acid (CENTRUM) chewable tablet, Chew 1 tablet by mouth daily., Disp: , Rfl:   Past Medical History: Past Medical History:  Diagnosis Date  . CAD (coronary artery disease)    a. 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 Essential hypertension   . Hyperlipidemia   . Impaired fasting glucose   . Other fatigue   . Palpitations     Tobacco Use: Social History   Tobacco Use  Smoking Status Former Smoker  Smokeless Tobacco Never Used    Labs: Recent Clinical biochemist for [facility]iac and Pulmonary Rehab Latest Ref Rng & Units 12/23/2017 12/23/2017 12/23/2017 12/23/2017 12/24/2017   Cholestrol 0 - 200 mg/dL - - - - -   LDLCALC 0 - 99 mg/dL - - - - -   HDL >40 mg/dL - - - - -   Trlycerides <150 mg/dL - - - - -   Hemoglobin A1c 4.8 - 5.6 % - - - - -   PHART 7.350 - 7.450 7.335(L) 7.279(L) 7.339(L) - -   PCO2ART 32.0 - 48.0 mmHg 40.8 39.2 39.6 - -   HCO3 20.0 - 28.0 mmol/L 22.0 18.1(L) 21.5 - -   TCO2 22 - 32 mmol/L 23 19(L) 23 20(L) 23   ACIDBASEDEF 0.0 - 2.0 mmol/L 4.0(H) 8.0(H) 4.0(H) - -   O2SAT % 99.0 99.0 99.0 - -      Capillary Blood Glucose: Lab Results  Component Value Date   GLUCAP 89 12/28/2017   GLUCAP 89 12/27/2017   GLUCAP 85 12/27/2017   GLUCAP 81 12/27/2017   GLUCAP 80 12/27/2017     Exercise Target Goals: Exercise Program Goal: Individual exercise prescription set using results from initial 6 min walk test and THRR while considering  patient's activity barriers and safety.   Exercise Prescription Goal: Starting with aerobic activity 30 plus minutes a day, 3 days per week for initial exercise prescription. Provide home exercise prescription and guidelines that  participant acknowledges understanding prior to discharge.  Activity Barriers & Risk Stratification: Activity Barriers & Cardiac Risk Stratification - 03/03/18 1016      Activity Barriers & Cardiac Risk Stratification   Activity Barriers  Other (comment)    Comments  L shoulder pain     Cardiac Risk Stratification  High       6 Minute Walk: 6 Minute Walk    Row Name 03/03/18 1015         6 Minute Walk   Phase  Initial     Distance  1450 feet     Walk Time  6 minutes     # of Rest Breaks  0     MPH  2.75     METS  3.1     RPE  9     Perceived Dyspnea   7     VO2 Peak  12.03     Symptoms  No     Resting HR  63 bpm     Resting BP  110/60     Resting Oxygen Saturation   98 %     Exercise Oxygen Saturation  during 6 min walk  98 %      Max Ex. HR  83 bpm     Max Ex. BP  122/62     2 Minute Post BP  114/60        Oxygen Initial Assessment:   Oxygen Re-Evaluation:   Oxygen Discharge (Final Oxygen Re-Evaluation):   Initial Exercise Prescription: Initial Exercise Prescription - 03/03/18 1000      Date of Initial Exercise RX and Referring Provider   Date  03/03/18    Referring Provider  [name]     Expected Discharge Date  06/03/18      Treadmill   MPH  1.4    Grade  0    Minutes  17    METs  2.07      NuStep   Level  1    SPM  108    Minutes  17    METs  2.4      Prescription Details   Frequency (times per week)  3    Duration  Progress to 30 minutes of continuous aerobic without signs/symptoms of physical distress      Intensity   THRR 40-80% of Max Heartrate  100-119-138    Ratings of Perceived Exertion  11-13    Perceived Dyspnea  0-4      Progression   Progression  Continue to progress workloads to maintain intensity without signs/symptoms of physical distress.      Resistance Training   Training Prescription  Yes    Weight  1    Reps  10-15       Perform Capillary Blood Glucose checks as needed.  Exercise Prescription Changes:  Exercise Prescription Changes    Row Name 03/03/18 1000 03/14/18 1000 03/27/18 1400 04/11/18 1200       Response to Exercise   Blood Pressure (Admit)  -  114/68  106/62  120/72    Blood Pressure (Exercise)  -  144/60  158/78  150/72    Blood Pressure (Exit)  -  112/60  98/62  98/58    Heart Rate (Admit)  -  51 bpm  60 bpm  60 bpm    Heart Rate (Exercise)  -  105 bpm  98 bpm  112 bpm    Heart Rate (Exit)  -  60 bpm  73 bpm  78 bpm    Rating of Perceived Exertion (Exercise)  -  '11  11  11    '$ Comments  -  First full week of exercise!   Increase in overall MET level   -    Duration  -  Progress to 30 minutes of  aerobic without signs/symptoms of physical distress  Progress to 30 minutes of  aerobic without signs/symptoms of physical distress  Continue  with 30 min of aerobic exercise without signs/symptoms of physical distress.    Intensity  -  THRR New 100-119-138  THRR unchanged  THRR unchanged      Progression   Progression  -  Continue to progress workloads to maintain intensity without signs/symptoms of physical distress.  Continue to progress workloads to maintain intensity without signs/symptoms of physical distress.  Continue to progress workloads to maintain intensity without signs/symptoms of physical distress.    Average METs  -  2.45  3.9  4.8      Resistance Training   Training Prescription  -  Yes  Yes  Yes    Weight  -  '1  2  3    '$ Reps  -  10-15  10-15  10-15      Treadmill   MPH  -  1.8  2.5  2.7    Grade  -  0  0  0    Minutes  -  '17  17  22    '$ METs  -  2.3  2.9  3.1      NuStep   Level  -  1  - switched to XR   -    SPM  -  115  -  -    Minutes  -  17  -  -    METs  -  2.6  -  -      Recumbant Elliptical   Level  -  -  1  2    RPM  -  -  64  89    Watts  -  -  92  116    Minutes  -  -  22  17    METs  -  -  4.9  6.5      Home Exercise Plan   Plans to continue exercise at  Home (comment)  Home (comment)  Home (comment)  Home (comment)    Frequency  Add 2 additional days to program exercise sessions.  Add 2 additional days to program exercise sessions.  Add 2 additional days to program exercise sessions.  Add 2 additional days to program exercise sessions.    Initial Home Exercises Provided  03/03/18  03/03/18  03/03/18  03/03/18       Exercise Comments:  Exercise Comments    Row Name 03/27/18 1451 04/18/18 0829         Exercise Comments  Pt. has attended 10 sessions so far. He is eager to be here and increase his strength to get back to work. He has done well in the program and is tolerating the exercise with ease.   Pt. works hard every session to push himself farther than before. He is willing and able to handle all increases with ease. We will continue to progress him as needed.          Exercise  Goals and Review:  Exercise Goals    Row Name 03/03/18 1018  Exercise Goals   Increase Physical Activity  Yes       Intervention  Provide advice, education, support and counseling about physical activity/exercise needs.;Develop an individualized exercise prescription for aerobic and resistive training based on initial evaluation findings, risk stratification, comorbidities and participant's personal goals.       Expected Outcomes  Short Term: Attend rehab on a regular basis to increase amount of physical activity.;Long Term: Add in home exercise to make exercise part of routine and to increase amount of physical activity.;Long Term: Exercising regularly at least 3-5 days a week.       Increase Strength and Stamina  Yes       Intervention  Provide advice, education, support and counseling about physical activity/exercise needs.;Develop an individualized exercise prescription for aerobic and resistive training based on initial evaluation findings, risk stratification, comorbidities and participant's personal goals.       Expected Outcomes  Short Term: Increase workloads from initial exercise prescription for resistance, speed, and METs.       Able to understand and use rate of perceived exertion (RPE) scale  Yes       Intervention  Provide education and explanation on how to use RPE scale       Expected Outcomes  Short Term: Able to use RPE daily in rehab to express subjective intensity level;Long Term:  Able to use RPE to guide intensity level when exercising independently       Able to understand and use Dyspnea scale  Yes       Intervention  Provide education and explanation on how to use Dyspnea scale       Expected Outcomes  Short Term: Able to use Dyspnea scale daily in rehab to express subjective sense of shortness of breath during exertion;Long Term: Able to use Dyspnea scale to guide intensity level when exercising independently       Knowledge and understanding of Target  Heart Rate Range (THRR)  Yes       Intervention  Provide education and explanation of THRR including how the numbers were predicted and where they are located for reference       Expected Outcomes  Short Term: Able to state/look up THRR;Long Term: Able to use THRR to govern intensity when exercising independently;Short Term: Able to use daily as guideline for intensity in rehab       Able to check pulse independently  Yes       Intervention  Provide education and demonstration on how to check pulse in carotid and radial arteries.;Review the importance of being able to check your own pulse for safety during independent exercise       Expected Outcomes  Short Term: Able to explain why pulse checking is important during independent exercise;Long Term: Able to check pulse independently and accurately       Understanding of Exercise Prescription  Yes       Intervention  Provide education, explanation, and written materials on patient's individual exercise prescription       Expected Outcomes  Short Term: Able to explain program exercise prescription;Long Term: Able to explain home exercise prescription to exercise independently          Exercise Goals Re-Evaluation : Exercise Goals Re-Evaluation    [place] Name 03/27/18 1450 04/18/18 0826           Exercise Goal Re-Evaluation   Exercise Goals Review  Increase Physical Activity;Increase Strength and Stamina;Able to understand and use rate of perceived exertion (RPE) scale;Knowledge  and understanding of Target Heart Rate Range (THRR);Able to check pulse independently;Understanding of Exercise Prescription  Increase Physical Activity;Increase Strength and Stamina;Able to understand and use rate of perceived exertion (RPE) scale;Knowledge and understanding of Target Heart Rate Range (THRR);Able to check pulse independently;Understanding of Exercise Prescription      Comments  Pt. has done excellent in the program so far. He has tolerated all exercise and  increases well. We will continue to progress as we see fit.   Pt. continues to excel in the program. He works hard every session and pushes to increase his distance and overall MET level. He is up to 3.0MPH on the TM and reaching 6.9 METS on the XR.      Expected Outcomes  Increase strength to get back to work.   increase stamina and strength.           Discharge Exercise Prescription (Final Exercise Prescription Changes): Exercise Prescription Changes - 04/11/18 1200      Response to Exercise   Blood Pressure (Admit)  120/72    Blood Pressure (Exercise)  150/72    Blood Pressure (Exit)  98/58    Heart Rate (Admit)  60 bpm    Heart Rate (Exercise)  112 bpm    Heart Rate (Exit)  78 bpm    Rating of Perceived Exertion (Exercise)  11    Duration  Continue with 30 min of aerobic exercise without signs/symptoms of physical distress.    Intensity  THRR unchanged      Progression   Progression  Continue to progress workloads to maintain intensity without signs/symptoms of physical distress.    Average METs  4.8      Resistance Training   Training Prescription  Yes    Weight  3    Reps  10-15      Treadmill   MPH  2.7    Grade  0    Minutes  22    METs  3.1      Recumbant Elliptical   Level  2    RPM  89    Watts  116    Minutes  17    METs  6.5      Home Exercise Plan   Plans to continue exercise at  Home (comment)    Frequency  Add 2 additional days to program exercise sessions.    Initial Home Exercises Provided  03/03/18       Nutrition:  Target Goals: Understanding of nutrition guidelines, daily intake of sodium '1500mg'$ , cholesterol '200mg'$ , calories 30% from fat and 7% or less from saturated fats, daily to have 5 or more servings of fruits and vegetables.  Biometrics: Pre Biometrics - 03/03/18 1018      Pre Biometrics   Height  '5\' 9"'$  (1.753 m)    Weight  80.5 kg    Waist Circumference  37.5 inches    Hip Circumference  40 inches    Waist to Hip Ratio  0.94 %     BMI (Calculated)  26.2    Triceps Skinfold  6 mm    % Body Fat  21.9 %    Grip Strength  17.6 kg    Flexibility  12.6 in    Single Leg Stand  2.47 seconds        Nutrition Therapy Plan and Nutrition Goals: Nutrition Therapy & Goals - 04/20/18 0747      Nutrition Therapy   RD appointment deferred  Yes  Personal Nutrition Goals   Comments  Patient was encouraged to attend the RD appointment today. Will continue to monitor for progress.       Intervention Plan   Intervention  Nutrition handout(s) given to patient.       Nutrition Assessments: Nutrition Assessments - 03/03/18 0954      MEDFICTS Scores   Pre Score  9       Nutrition Goals Re-Evaluation:   Nutrition Goals Discharge (Final Nutrition Goals Re-Evaluation):   Psychosocial: Target Goals: Acknowledge presence or absence of significant depression and/or stress, maximize coping skills, provide positive support system. Participant is able to verbalize types and ability to use techniques and skills needed for reducing stress and depression.  Initial Review & Psychosocial Screening: Initial Psych Review & Screening - 03/03/18 1026      Family Dynamics   Good Support System?  Yes      Barriers   Psychosocial barriers to participate in program  There are no identifiable barriers or psychosocial needs.      Screening Interventions   Interventions  Encouraged to exercise       Quality of Life Scores: Quality of Life - 03/03/18 1020      Quality of Life   Select  Quality of Life      Quality of Life Scores   Health/Function Pre  26 %    Socioeconomic Pre  30 %    Psych/Spiritual Pre  30 %    Family Pre  30 %    GLOBAL Pre  28.29 %      Scores of 19 and below usually indicate a poorer quality of life in these areas.  A difference of  2-3 points is a clinically meaningful difference.  A difference of 2-3 points in the total score of the Quality of Life Index has been associated with significant  improvement in overall quality of life, self-image, physical symptoms, and general health in studies assessing change in quality of life.  PHQ-9: Recent Review Flowsheet Data    Depression screen [facility] 2/9 03/03/2018   Decreased Interest 0   Down, Depressed, Hopeless 0   PHQ - 2 Score 0   Altered sleeping 0   Tired, decreased energy 1   Change in appetite 0   Feeling bad or failure about yourself  0   Trouble concentrating 0   Moving slowly or fidgety/restless 0   Suicidal thoughts 0   PHQ-9 Score 1   Difficult doing work/chores Somewhat difficult     Interpretation of Total Score  Total Score Depression Severity:  1-4 = Minimal depression, 5-9 = Mild depression, 10-14 = Moderate depression, 15-19 = Moderately severe depression, 20-27 = Severe depression   Psychosocial Evaluation and Intervention: Psychosocial Evaluation - 03/03/18 1027      Psychosocial Evaluation & Interventions   Interventions  Stress management education;Relaxation education;Encouraged to exercise with the program and follow exercise prescription    Comments  Patient has no psychosocial barriers identified at orientation. His initial QOL score was 28.29 and his PHQ-9 score was 1.    Expected Outcomes  Patient will have no psychosocial issues identified at discharge.     Continue Psychosocial Services   No Follow up required       Psychosocial Re-Evaluation: Psychosocial Re-Evaluation    [place] Name 03/27/18 1505 04/20/18 0750           Psychosocial Re-Evaluation   Current issues with  None Identified  None Identified  Comments  Patient's initial QOL score was 28.29 and his PHQ-9 1 with no psychosocial issues identified.   Patient's initial QOL score was 28.29 and his PHQ-9 1 with no psychosocial issues identified.       Expected Outcomes  Patient will have no psychosocial issues identified at discharge.   Patient will have no psychosocial issues identified at discharge.       Interventions  Stress  management education;Relaxation education;Encouraged to attend Cardiac Rehabilitation for the exercise  Stress management education;Relaxation education;Encouraged to attend Cardiac Rehabilitation for the exercise      Continue Psychosocial Services   No Follow up required  No Follow up required         Psychosocial Discharge (Final Psychosocial Re-Evaluation): Psychosocial Re-Evaluation - 04/20/18 0750      Psychosocial Re-Evaluation   Current issues with  None Identified    Comments  Patient's initial QOL score was 28.29 and his PHQ-9 1 with no psychosocial issues identified.     Expected Outcomes  Patient will have no psychosocial issues identified at discharge.     Interventions  Stress management education;Relaxation education;Encouraged to attend Cardiac Rehabilitation for the exercise    Continue Psychosocial Services   No Follow up required       Vocational Rehabilitation: Provide vocational rehab assistance to qualifying candidates.   Vocational Rehab Evaluation & Intervention: Vocational Rehab - 03/03/18 0956      Initial Vocational Rehab Evaluation & Intervention   Assessment shows need for Vocational Rehabilitation  No       Education: Education Goals: Education classes will be provided on a weekly basis, covering required topics. Participant will state understanding/return demonstration of topics presented.  Learning Barriers/Preferences: Learning Barriers/Preferences - 03/03/18 0954      Learning Barriers/Preferences   Learning Barriers  None    Learning Preferences  Skilled Demonstration;Pictoral       Education Topics: Hypertension, Hypertension Reduction -Define heart disease and high blood pressure. Discus how high blood pressure affects the body and ways to reduce high blood pressure.   Exercise and Your Heart -Discuss why it is important to exercise, the FITT principles of exercise, normal and abnormal responses to exercise, and how to exercise  safely.   Angina -Discuss definition of angina, causes of angina, treatment of angina, and how to decrease risk of having angina.   Cardiac Medications -Review what the following cardiac medications are used for, how they affect the body, and side effects that may occur when taking the medications.  Medications include Aspirin, Beta blockers, calcium channel blockers, ACE Inhibitors, angiotensin receptor blockers, diuretics, digoxin, and antihyperlipidemics.   CARDIAC REHAB PHASE II EXERCISE from 04/19/2018 in [place]  Date  03/08/18  Educator  Etheleen Mayhew  Instruction Review Code  2- Demonstrated Understanding      Congestive Heart Failure -Discuss the definition of CHF, how to live with CHF, the signs and symptoms of CHF, and how keep track of weight and sodium intake.   CARDIAC REHAB PHASE II EXERCISE from 04/19/2018 in [place]  Date  03/15/18  Educator  Wynetta Emery  Instruction Review Code  2- Demonstrated Understanding      Heart Disease and Intimacy -Discus the effect sexual activity has on the heart, how changes occur during intimacy as we age, and safety during sexual activity.   CARDIAC REHAB PHASE II EXERCISE from 04/19/2018 in [place]  Date  03/22/18  Educator  Etheleen Mayhew  Instruction Review Code  2- Demonstrated Understanding      Smoking Cessation / COPD -Discuss different methods to quit smoking, the health benefits of quitting smoking, and the definition of COPD.   CARDIAC REHAB PHASE II EXERCISE from 04/19/2018 in [place]  Date  03/29/18  Educator  Wynetta Emery  Instruction Review Code  2- Demonstrated Understanding      Nutrition I: Fats -Discuss the types of cholesterol, what cholesterol does to the heart, and how cholesterol levels can be controlled.   Nutrition II: Labels -Discuss the different components of food labels and how to read food label   CARDIAC REHAB  PHASE II EXERCISE from 04/19/2018 in [place]  Date  04/19/18  Educator  DC  Instruction Review Code  2- Demonstrated Understanding      Heart Parts/Heart Disease and PAD -Discuss the anatomy of the heart, the pathway of blood circulation through the heart, and these are affected by heart disease.   CARDIAC REHAB PHASE II EXERCISE from 04/19/2018 in [place]  Date  04/14/18  Educator  Wynetta Emery  Instruction Review Code  2- Demonstrated Understanding      Stress I: Signs and Symptoms -Discuss the causes of stress, how stress may lead to anxiety and depression, and ways to limit stress.   Stress II: Relaxation -Discuss different types of relaxation techniques to limit stress.   Warning Signs of Stroke / TIA -Discuss definition of a stroke, what the signs and symptoms are of a stroke, and how to identify when someone is having stroke.   Knowledge Questionnaire Score: Knowledge Questionnaire Score - 03/03/18 0955      Knowledge Questionnaire Score   Pre Score  19/24       Core Components/Risk Factors/Patient Goals at Admission: Personal Goals and Risk Factors at Admission - 03/03/18 0956      Core Components/Risk Factors/Patient Goals on Admission    Weight Management  Weight Maintenance    Personal Goal Other  Yes    Personal Goal  Patient wants to get healthier and get back to work. He wants to increase his strength and stamina.     Intervention  Patient will attend CR 3 days/week and supplement with exercise 2 days/week.    Expected Outcomes  Patient will complete the program meeting his personal goals.        Core Components/Risk Factors/Patient Goals Review:  Goals and Risk Factor Review    Row Name 03/27/18 1503 04/20/18 0748           Core Components/Risk Factors/Patient Goals Review   Personal Goals Review  Other Get healthier; get back to work; increase strength and stamina.   Other Get healthier; get back to  work; increase strength and stamina.       Review  Patient has completed 10 sessions gaining 4 lbs since his initial visit. He is doing well in the program with progression. He says he does feel stronger and has more endurance. He hopes to continue to meet his goals as he continues the program. Will continue to monitor for progress.   Patient has completed 17 sessions maintaining his weight since last 30 day review. He continues to do well in the program with progression. He continues to say he feels better and feels stronger. He hopes to return to work in March 20 after seeing his cardiologist and completing the program. He already feels strong enough to work. He is pleased with his progress. Will continue to monitor.  Expected Outcomes  Patient will continue to attend sessions and complete the program meeting his personal goals.   Patient will continue to attend sessions and complete the program meeting his personal goals.          Core Components/Risk Factors/Patient Goals at Discharge (Final Review):  Goals and Risk Factor Review - 04/20/18 0748      Core Components/Risk Factors/Patient Goals Review   Personal Goals Review  Other   Get healthier; get back to work; increase strength and stamina.    Review  Patient has completed 17 sessions maintaining his weight since last 30 day review. He continues to do well in the program with progression. He continues to say he feels better and feels stronger. He hopes to return to work in March 20 after seeing his cardiologist and completing the program. He already feels strong enough to work. He is pleased with his progress. Will continue to monitor.     Expected Outcomes  Patient will continue to attend sessions and complete the program meeting his personal goals.        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 He has a h/o  hyperlipidemia and is a former smoker. He has no barriers to CR identified at orientation.   Patient is new to program. He has completed 3 sessions. Will continue to monitor for progress.          Comments: ITP REVIEW Patient doing well in the program. Will continue to monitor for progress.

## 2018-04-21 ENCOUNTER — Encounter (HOSPITAL_COMMUNITY)
Admission: RE | Admit: 2018-04-21 | Discharge: 2018-04-21 | Disposition: A | Discharge status: Home / Self Care | Payer: 59 | Source: Ambulatory Visit | Attending: Cardiovascular Disease | Admitting: Cardiovascular Disease

## 2018-04-21 ENCOUNTER — Other Ambulatory Visit: Payer: Self-pay | Admitting: Cardiothoracic Surgery

## 2018-04-21 DIAGNOSIS — Z951 Presence of aortocoronary bypass graft: Secondary | ICD-10-CM

## 2018-04-21 NOTE — Progress Notes (Signed)
Daily Session Note  Patient Details  Name: Glenn [name] MRN: [id number] Date of Birth: 11/29/1955 Referring Provider:     [place] from 03/03/2018 in [place]  Referring Provider  [name]       Encounter Date: 04/21/2018  Check In: Session Check In - 04/21/18 0815      Check-In   Supervising physician immediately available to respond to emergencies  See telemetry face sheet for immediately available MD    Location  [facility]    Staff Present  Benay Pike, Exercise Physiologist;Diane Coad, MS, EP, [facility], Exercise Physiologist;Debra Wynetta Emery, RN, BSN    Medication changes reported      No    Fall or balance concerns reported     No    Tobacco Cessation  No Change    Warm-up and Cool-down  Performed as group-led instruction    Resistance Training Performed  Yes    VAD Patient?  No    PAD/SET Patient?  No      Pain Assessment   Currently in Pain?  No/denies    Pain Score  0-No pain    Multiple Pain Sites  No       Capillary Blood Glucose: No results found for this or any previous visit (from the past 24 hour(s)).    Social History   Tobacco Use  Smoking Status Former Smoker  Smokeless Tobacco Never Used    Goals Met:  Independence with exercise equipment Exercise tolerated well No report of cardiac concerns or symptoms Strength training completed today  Goals Unmet:  Not Applicable  Comments: Pt able to follow exercise prescription today without complaint.  Will continue to monitor for progression. Check out 0915.   Dr. Kate Sable is Medical Director for [facility] Cardiac and Pulmonary Rehab.

## 2018-04-24 ENCOUNTER — Encounter (HOSPITAL_COMMUNITY)
Admission: RE | Admit: 2018-04-24 | Discharge: 2018-04-24 | Disposition: A | Discharge status: Home / Self Care | Payer: 59 | Source: Ambulatory Visit | Attending: Cardiovascular Disease | Admitting: Cardiovascular Disease

## 2018-04-24 DIAGNOSIS — Z951 Presence of aortocoronary bypass graft: Secondary | ICD-10-CM

## 2018-04-24 NOTE — Progress Notes (Signed)
Daily Session Note  Patient Details  Name: Glenn [name] MRN: [id number] Date of Birth: 1955-10-07 Referring Provider:     [place] from 03/03/2018 in [place]  Referring Provider  [name]       Encounter Date: 04/24/2018  Check In: Session Check In - 04/24/18 0815      Check-In   Supervising physician immediately available to respond to emergencies  See telemetry face sheet for immediately available MD    Location  [facility]    Staff Present  Benay Pike, Exercise Physiologist;Diane Coad, MS, EP, [facility], Exercise Physiologist;Debra Wynetta Emery, RN, BSN    Medication changes reported      No    Fall or balance concerns reported     No    Tobacco Cessation  No Change    Warm-up and Cool-down  Performed as group-led instruction    Resistance Training Performed  Yes    VAD Patient?  No    PAD/SET Patient?  No      Pain Assessment   Currently in Pain?  No/denies    Pain Score  0-No pain    Multiple Pain Sites  No       Capillary Blood Glucose: No results found for this or any previous visit (from the past 24 hour(s)).    Social History   Tobacco Use  Smoking Status Former Smoker  Smokeless Tobacco Never Used    Goals Met:  Independence with exercise equipment Exercise tolerated well No report of cardiac concerns or symptoms Strength training completed today  Goals Unmet:  Not Applicable  Comments: Pt able to follow exercise prescription today without complaint.  Will continue to monitor for progression. Check out 0915.   Dr. Kate Sable is Medical Director for [facility] Cardiac and Pulmonary Rehab.

## 2018-04-25 ENCOUNTER — Other Ambulatory Visit: Payer: Self-pay | Admitting: Cardiothoracic Surgery

## 2018-04-26 ENCOUNTER — Encounter (HOSPITAL_COMMUNITY)
Admission: RE | Admit: 2018-04-26 | Discharge: 2018-04-26 | Disposition: A | Discharge status: Home / Self Care | Payer: 59 | Source: Ambulatory Visit | Attending: Cardiovascular Disease | Admitting: Cardiovascular Disease

## 2018-04-26 DIAGNOSIS — Z951 Presence of aortocoronary bypass graft: Secondary | ICD-10-CM | POA: Diagnosis not present

## 2018-04-26 NOTE — Progress Notes (Signed)
Daily Session Note  Patient Details  Name: TREW SUNDE MRN: [id number] Date of Birth: 1956-02-27 Referring Provider:     [place] from 03/03/2018 in [place]  Referring Provider  [name]       Encounter Date: 04/26/2018  Check In: Session Check In - 04/26/18 0815      Check-In   Supervising physician immediately available to respond to emergencies  See telemetry face sheet for immediately available MD    Location  [facility]    Staff Present  Benay Pike, Exercise Physiologist;Debra Wynetta Emery, RN, BSN    Medication changes reported      No    Fall or balance concerns reported     No    Tobacco Cessation  No Change    Warm-up and Cool-down  Performed as group-led instruction    Resistance Training Performed  Yes    VAD Patient?  No    PAD/SET Patient?  No      Pain Assessment   Currently in Pain?  No/denies    Pain Score  0-No pain    Multiple Pain Sites  No       Capillary Blood Glucose: No results found for this or any previous visit (from the past 24 hour(s)).    Social History   Tobacco Use  Smoking Status Former Smoker  Smokeless Tobacco Never Used    Goals Met:  Independence with exercise equipment Exercise tolerated well No report of cardiac concerns or symptoms Strength training completed today  Goals Unmet:  Not Applicable  Comments: Pt able to follow exercise prescription today without complaint.  Will continue to monitor for progression. Check out 0915.   Dr. Kate Sable is Medical Director for [facility] Cardiac and Pulmonary Rehab.

## 2018-04-27 ENCOUNTER — Telehealth: Payer: Self-pay | Admitting: Cardiovascular Disease

## 2018-04-27 MED ORDER — CLOPIDOGREL BISULFATE 75 MG PO TABS: 75.0000 mg | ORAL_TABLET | Freq: Every day | ORAL | 2 refills | Status: DC

## 2018-04-27 NOTE — Telephone Encounter (Signed)
done

## 2018-04-27 NOTE — Telephone Encounter (Signed)
Needing refill on clopidogrel (PLAVIX) 75 MG tablet [[id number]]  Sent to [facility] on [address] for 90 days

## 2018-04-28 ENCOUNTER — Encounter (HOSPITAL_COMMUNITY)
Admission: RE | Admit: 2018-04-28 | Discharge: 2018-04-28 | Disposition: A | Discharge status: Home / Self Care | Payer: 59 | Source: Ambulatory Visit | Attending: Cardiovascular Disease | Admitting: Cardiovascular Disease

## 2018-04-28 DIAGNOSIS — Z951 Presence of aortocoronary bypass graft: Secondary | ICD-10-CM

## 2018-04-28 NOTE — Progress Notes (Signed)
Daily Session Note  Patient Details  Name: Glenn [name] MRN: [id number] Date of Birth: [birth year] Referring Provider:     [place] from 03/03/2018 in [place]  Referring Provider  [name]       Encounter Date: 04/28/2018  Check In: Session Check In - 04/28/18 0815      Check-In   Supervising physician immediately available to respond to emergencies  See telemetry face sheet for immediately available MD    Location  [facility]    Staff Present  Benay Pike, Exercise Physiologist;Morayo Leven Wynetta Emery, RN, BSN    Medication changes reported      No    Fall or balance concerns reported     No    Warm-up and Cool-down  Performed as group-led instruction    Resistance Training Performed  Yes    VAD Patient?  No    PAD/SET Patient?  No      Pain Assessment   Currently in Pain?  No/denies    Pain Score  0-No pain    Multiple Pain Sites  No       Capillary Blood Glucose: No results found for this or any previous visit (from the past 24 hour(s)).    Social History   Tobacco Use  Smoking Status Former Smoker  Smokeless Tobacco Never Used    Goals Met:  Independence with exercise equipment Exercise tolerated well No report of cardiac concerns or symptoms Strength training completed today  Goals Unmet:  Not Applicable  Comments: Pt able to follow exercise prescription today without complaint.  Will continue to monitor for progression. Check out 915.   Dr. Kate Sable is Medical Director for [facility] Cardiac and Pulmonary Rehab.

## 2018-05-01 ENCOUNTER — Encounter (HOSPITAL_COMMUNITY)
Admission: RE | Admit: 2018-05-01 | Discharge: 2018-05-01 | Disposition: A | Discharge status: Home / Self Care | Payer: 59 | Source: Ambulatory Visit | Attending: Cardiovascular Disease | Admitting: Cardiovascular Disease

## 2018-05-01 DIAGNOSIS — Z951 Presence of aortocoronary bypass graft: Secondary | ICD-10-CM

## 2018-05-01 NOTE — Progress Notes (Signed)
Daily Session Note  Patient Details  Name: Glenn [name] MRN: [id number] Date of Birth: 1955/10/02 Referring Provider:     [place] from 03/03/2018 in [place]  Referring Provider  [name]       Encounter Date: 05/01/2018  Check In: Session Check In - 05/01/18 0815      Check-In   Supervising physician immediately available to respond to emergencies  See telemetry face sheet for immediately available MD    Location  [facility]    Staff Present  Benay Pike, Exercise Physiologist;Debra Wynetta Emery, RN, BSN    Medication changes reported      No    Fall or balance concerns reported     No    Tobacco Cessation  No Change    Warm-up and Cool-down  Performed as group-led instruction    Resistance Training Performed  Yes    VAD Patient?  No    PAD/SET Patient?  No      Pain Assessment   Currently in Pain?  No/denies    Pain Score  0-No pain    Multiple Pain Sites  No       Capillary Blood Glucose: No results found for this or any previous visit (from the past 24 hour(s)).    Social History   Tobacco Use  Smoking Status Former Smoker  Smokeless Tobacco Never Used    Goals Met:  Independence with exercise equipment Exercise tolerated well No report of cardiac concerns or symptoms Strength training completed today  Goals Unmet:  Not Applicable  Comments: Pt able to follow exercise prescription today without complaint.  Will continue to monitor for progression. Check out 0915.   Dr. Kate Sable is Medical Director for [facility] Cardiac and Pulmonary Rehab.

## 2018-05-03 ENCOUNTER — Encounter (HOSPITAL_COMMUNITY)
Admission: RE | Admit: 2018-05-03 | Discharge: 2018-05-03 | Disposition: A | Discharge status: Home / Self Care | Payer: 59 | Source: Ambulatory Visit | Attending: Cardiovascular Disease | Admitting: Cardiovascular Disease

## 2018-05-03 DIAGNOSIS — Z951 Presence of aortocoronary bypass graft: Secondary | ICD-10-CM | POA: Diagnosis not present

## 2018-05-03 NOTE — Progress Notes (Signed)
Daily Session Note  Patient Details  Name: Glenn [name] MRN: [id number] Date of Birth: 21-Oct-1955 Referring Provider:     [name] from 03/03/2018 in [name]  Referring Provider  Glenn [name]       Encounter Date: 05/03/2018  Check In: Session Check In - 05/03/18 0815      Check-In   Supervising physician immediately available to respond to emergencies  See telemetry face sheet for immediately available MD    Location  [facility]    Staff Present  Glenn [name], Exercise Physiologist;Glenn Wynetta Emery, RN, BSN;Glenn Coad, MS, EP, [facility], Exercise Physiologist    Medication changes reported      No    Fall or balance concerns reported     No    Tobacco Cessation  No Change    Warm-up and Cool-down  Performed as group-led instruction    Resistance Training Performed  Yes    VAD Patient?  No    PAD/SET Patient?  No      Pain Assessment   Currently in Pain?  No/denies    Pain Score  0-No pain    Multiple Pain Sites  No       Capillary Blood Glucose: No results found for this or any previous visit (from the past 24 hour(s)).    Social History   Tobacco Use  Smoking Status Former Smoker  Smokeless Tobacco Never Used    Goals Met:  Independence with exercise equipment Exercise tolerated well No report of cardiac concerns or symptoms Strength training completed today  Goals Unmet:  Not Applicable  Comments: Pt able to follow exercise prescription today without complaint.  Will continue to monitor for progression. Check out 0915.   Dr. Kate [name] is Medical Director for [facility] Cardiac and Pulmonary Rehab.

## 2018-05-05 ENCOUNTER — Encounter (HOSPITAL_COMMUNITY)
Admission: RE | Admit: 2018-05-05 | Discharge: 2018-05-05 | Disposition: A | Discharge status: Home / Self Care | Payer: 59 | Source: Ambulatory Visit | Attending: Cardiovascular Disease | Admitting: Cardiovascular Disease

## 2018-05-05 DIAGNOSIS — Z951 Presence of aortocoronary bypass graft: Secondary | ICD-10-CM

## 2018-05-05 NOTE — Progress Notes (Signed)
Daily Session Note  Patient Details  Name: Glenn [name] MRN: [id number] Date of Birth: 1955-11-17 Referring Provider:     [place] from 03/03/2018 in [place]  Referring Provider  [name]       Encounter Date: 05/05/2018  Check In: Session Check In - 05/05/18 0815      Check-In   Supervising physician immediately available to respond to emergencies  See telemetry face sheet for immediately available MD    Location  [facility]    Staff Present  Benay Pike, Exercise Physiologist;Diane Coad, MS, EP, [facility], Exercise Physiologist    Medication changes reported      No    Fall or balance concerns reported     No    Tobacco Cessation  No Change    Warm-up and Cool-down  Performed as group-led instruction    Resistance Training Performed  Yes    VAD Patient?  No    PAD/SET Patient?  No      Pain Assessment   Currently in Pain?  No/denies    Pain Score  0-No pain    Multiple Pain Sites  No       Capillary Blood Glucose: No results found for this or any previous visit (from the past 24 hour(s)).    Social History   Tobacco Use  Smoking Status Former Smoker  Smokeless Tobacco Never Used    Goals Met:  Independence with exercise equipment Exercise tolerated well No report of cardiac concerns or symptoms Strength training completed today  Goals Unmet:  Not Applicable  Comments: Pt able to follow exercise prescription today without complaint.  Will continue to monitor for progression. Check out 0915.   Dr. Kate Sable is Medical Director for [facility] Cardiac and Pulmonary Rehab.

## 2018-05-08 ENCOUNTER — Encounter (HOSPITAL_COMMUNITY)
Admission: RE | Admit: 2018-05-08 | Discharge: 2018-05-08 | Disposition: A | Discharge status: Home / Self Care | Payer: 59 | Source: Ambulatory Visit | Attending: Cardiovascular Disease | Admitting: Cardiovascular Disease

## 2018-05-08 DIAGNOSIS — Z951 Presence of aortocoronary bypass graft: Secondary | ICD-10-CM

## 2018-05-08 NOTE — Progress Notes (Signed)
Daily Session Note  Patient Details  Name: Glenn [name] MRN: [id number] Date of Birth: [birth year] Referring Provider:     [place] from 03/03/2018 in [place]  Referring Provider  [name]       Encounter Date: 05/08/2018  Check In: Session Check In - 05/08/18 0815      Check-In   Supervising physician immediately available to respond to emergencies  See telemetry face sheet for immediately available MD    Location  [facility]    Staff Present  Benay Pike, Exercise Physiologist;Diane Coad, MS, EP, [facility], Exercise Physiologist;Debra Wynetta Emery, RN, BSN    Medication changes reported      No    Fall or balance concerns reported     No    Tobacco Cessation  No Change    Warm-up and Cool-down  Performed as group-led instruction    Resistance Training Performed  Yes    VAD Patient?  No    PAD/SET Patient?  No      Pain Assessment   Currently in Pain?  No/denies    Pain Score  0-No pain    Multiple Pain Sites  No       Capillary Blood Glucose: No results found for this or any previous visit (from the past 24 hour(s)).    Social History   Tobacco Use  Smoking Status Former Smoker  Smokeless Tobacco Never Used    Goals Met:  Independence with exercise equipment Exercise tolerated well No report of cardiac concerns or symptoms Strength training completed today  Goals Unmet:  Not Applicable  Comments: Pt able to follow exercise prescription today without complaint.  Will continue to monitor for progression. Check out 0915.   Dr. Kate Sable is Medical Director for [facility] Cardiac and Pulmonary Rehab.

## 2018-05-10 ENCOUNTER — Encounter (HOSPITAL_COMMUNITY)
Admission: RE | Admit: 2018-05-10 | Discharge: 2018-05-10 | Disposition: A | Discharge status: Home / Self Care | Payer: 59 | Source: Ambulatory Visit | Attending: Cardiovascular Disease | Admitting: Cardiovascular Disease

## 2018-05-10 DIAGNOSIS — Z951 Presence of aortocoronary bypass graft: Secondary | ICD-10-CM

## 2018-05-10 NOTE — Progress Notes (Signed)
Daily Session Note  Patient Details  Name: Glenn [name] MRN: [id number] Date of Birth: 28-Jun-1955 Referring Provider:     [place] from 03/03/2018 in [place]  Referring Provider  [name]       Encounter Date: 05/10/2018  Check In: Session Check In - 05/10/18 0815      Check-In   Supervising physician immediately available to respond to emergencies  See telemetry face sheet for immediately available MD    Location  [facility]    Staff Present  Benay Pike, Exercise Physiologist;Diane Coad, MS, EP, [facility], Exercise Physiologist;Debra Wynetta Emery, RN, BSN    Medication changes reported      No    Fall or balance concerns reported     No    Tobacco Cessation  No Change    Warm-up and Cool-down  Performed as group-led instruction    Resistance Training Performed  Yes    VAD Patient?  No    PAD/SET Patient?  No      Pain Assessment   Currently in Pain?  No/denies    Pain Score  0-No pain    Multiple Pain Sites  No       Capillary Blood Glucose: No results found for this or any previous visit (from the past 24 hour(s)).    Social History   Tobacco Use  Smoking Status Former Smoker  Smokeless Tobacco Never Used    Goals Met:  [place] associated with RPD/PD & O2 Sat Independence with exercise equipment Exercise tolerated well No report of cardiac concerns or symptoms Strength training completed today  Goals Unmet:  Not Applicable  Comments: Pt able to follow exercise prescription today without complaint.  Will continue to monitor for progression. Check out 0915.   Dr. Kate Sable is Medical Director for [facility] Cardiac and Pulmonary Rehab.

## 2018-05-12 ENCOUNTER — Encounter (HOSPITAL_COMMUNITY)
Admission: RE | Admit: 2018-05-12 | Discharge: 2018-05-12 | Disposition: A | Discharge status: Home / Self Care | Payer: 59 | Source: Ambulatory Visit | Attending: Cardiovascular Disease | Admitting: Cardiovascular Disease

## 2018-05-12 DIAGNOSIS — Z951 Presence of aortocoronary bypass graft: Secondary | ICD-10-CM

## 2018-05-12 NOTE — Progress Notes (Signed)
Daily Session Note  Patient Details  Name: Glenn [name] MRN: [id number] Date of Birth: Jun 25, 1955 Referring Provider:     [place] from 03/03/2018 in [place]  Referring Provider  [name]       Encounter Date: 05/12/2018  Check In: Session Check In - 05/12/18 0815      Check-In   Supervising physician immediately available to respond to emergencies  See telemetry face sheet for immediately available MD    Location  [facility]    Staff Present  Benay Pike, Exercise Physiologist;Diane Coad, MS, EP, [facility] Exercise Physiologist;Other    Medication changes reported      No    Fall or balance concerns reported     No    Tobacco Cessation  No Change    Warm-up and Cool-down  Performed as group-led instruction    Resistance Training Performed  Yes    VAD Patient?  No    PAD/SET Patient?  No      Pain Assessment   Currently in Pain?  No/denies    Pain Score  0-No pain    Multiple Pain Sites  No       Capillary Blood Glucose: No results found for this or any previous visit (from the past 24 hour(s)).    Social History   Tobacco Use  Smoking Status Former Smoker  Smokeless Tobacco Never Used    Goals Met:  [place] associated with RPD/PD & O2 Sat Independence with exercise equipment Exercise tolerated well No report of cardiac concerns or symptoms Strength training completed today  Goals Unmet:  Not Applicable  Comments: Pt able to follow exercise prescription today without complaint.  Will continue to monitor for progression. Check out 0915.   Dr. Kate Sable is Medical Director for [facility] Cardiac and Pulmonary Rehab.

## 2018-05-15 ENCOUNTER — Encounter (HOSPITAL_COMMUNITY)
Admission: RE | Admit: 2018-05-15 | Discharge: 2018-05-15 | Disposition: A | Discharge status: Home / Self Care | Payer: 59 | Source: Ambulatory Visit | Attending: Cardiovascular Disease | Admitting: Cardiovascular Disease

## 2018-05-15 DIAGNOSIS — Z951 Presence of aortocoronary bypass graft: Secondary | ICD-10-CM | POA: Insufficient documentation

## 2018-05-15 NOTE — Progress Notes (Signed)
Daily Session Note  Patient Details  Name: Glenn [name] MRN: [id number] Date of Birth: [birth year] Referring Provider:     [place] from 03/03/2018 in [place]  Referring Provider  [name]       Encounter Date: 05/15/2018  Check In: Session Check In - 05/15/18 0815      Check-In   Supervising physician immediately available to respond to emergencies  See telemetry face sheet for immediately available MD    Location  [facility]    Staff Present  Benay Pike, Exercise Physiologist;Debra Wynetta Emery, RN, BSN    Medication changes reported      No    Fall or balance concerns reported     No    Tobacco Cessation  No Change    Warm-up and Cool-down  Performed as group-led instruction    Resistance Training Performed  Yes    VAD Patient?  No    PAD/SET Patient?  No      Pain Assessment   Currently in Pain?  No/denies    Pain Score  0-No pain    Multiple Pain Sites  No       Capillary Blood Glucose: No results found for this or any previous visit (from the past 24 hour(s)).    Social History   Tobacco Use  Smoking Status Former Smoker  Smokeless Tobacco Never Used    Goals Met:  [place] associated with RPD/PD & O2 Sat Independence with exercise equipment Exercise tolerated well No report of cardiac concerns or symptoms Strength training completed today  Goals Unmet:  Not Applicable  Comments: Pt able to follow exercise prescription today without complaint.  Will continue to monitor for progression. Check out 0915.   Dr. Kate Sable is Medical Director for [facility] Cardiac and Pulmonary Rehab.

## 2018-05-17 ENCOUNTER — Encounter (HOSPITAL_COMMUNITY)
Admission: RE | Admit: 2018-05-17 | Discharge: 2018-05-17 | Disposition: A | Discharge status: Home / Self Care | Payer: 59 | Source: Ambulatory Visit | Attending: Cardiovascular Disease | Admitting: Cardiovascular Disease

## 2018-05-17 DIAGNOSIS — Z951 Presence of aortocoronary bypass graft: Secondary | ICD-10-CM | POA: Diagnosis not present

## 2018-05-17 NOTE — Progress Notes (Signed)
Cardiac Individual Treatment Plan  Patient Details  Name: Glenn [name] MRN: [id number] Date of Birth: [birth year] Referring Provider:     [place] from 03/03/2018 in [place]  Referring Provider  [name]       Initial Encounter Date:    CARDIAC REHAB PHASE II ORIENTATION from 03/03/2018 in [place]  Date  03/03/18      Visit Diagnosis: S/P CABG x 5  Patient's Home Medications on Admission:  Current Outpatient Medications:  .  aspirin EC 81 MG tablet, Take 81 mg by mouth daily., Disp: , Rfl:  .  atorvastatin (LIPITOR) 80 MG tablet, Take 1 tablet (80 mg total) by mouth daily at 6 PM., Disp: 90 tablet, Rfl: 1 .  clopidogrel (PLAVIX) 75 MG tablet, Take 1 tablet (75 mg total) by mouth daily., Disp: 90 tablet, Rfl: 2 .  escitalopram (LEXAPRO) 20 MG tablet, Take 20 mg by mouth daily., Disp: , Rfl:  .  lisinopril (PRINIVIL,ZESTRIL) 2.5 MG tablet, Take 1 tablet (2.5 mg total) by mouth daily., Disp: 90 tablet, Rfl: 1 .  Menthol-Methyl Salicylate (BENGAY GREASELESS EX), Apply 1 application topically as needed (shoulder pain)., Disp: , Rfl:  .  metoprolol succinate (TOPROL XL) 25 MG 24 hr tablet, Take 1 tablet (25 mg total) by mouth daily., Disp: 90 tablet, Rfl: 3 .  multivitamin-iron-minerals-folic acid (CENTRUM) chewable tablet, Chew 1 tablet by mouth daily., Disp: , Rfl:   Past Medical History: Past Medical History:  Diagnosis Date  . CAD (coronary artery disease)    a. 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 Essential hypertension   . Hyperlipidemia   . Impaired fasting glucose   . Other fatigue   . Palpitations     Tobacco Use: Social History   Tobacco Use  Smoking Status Former Smoker  Smokeless Tobacco Never Used    Labs: Recent Clinical biochemist for [facility] and Pulmonary Rehab Latest Ref Rng & Units 12/23/2017 12/23/2017 12/23/2017 12/23/2017 12/24/2017   Cholestrol 0 - 200 mg/dL - - - - -   LDLCALC 0 - 99 mg/dL - - - - -   HDL >40 mg/dL - - - - -   Trlycerides <150 mg/dL - - - - -   Hemoglobin A1c 4.8 - 5.6 % - - - - -   PHART 7.350 - 7.450 7.335(L) 7.279(L) 7.339(L) - -   PCO2ART 32.0 - 48.0 mmHg 40.8 39.2 39.6 - -   HCO3 20.0 - 28.0 mmol/L 22.0 18.1(L) 21.5 - -   TCO2 22 - 32 mmol/L 23 19(L) 23 20(L) 23   ACIDBASEDEF 0.0 - 2.0 mmol/L 4.0(H) 8.0(H) 4.0(H) - -   O2SAT % 99.0 99.0 99.0 - -      Capillary Blood Glucose: Lab Results  Component Value Date   GLUCAP 89 12/28/2017   GLUCAP 89 12/27/2017   GLUCAP 85 12/27/2017   GLUCAP 81 12/27/2017   GLUCAP 80 12/27/2017     Exercise Target Goals: Exercise Program Goal: Individual exercise prescription set using results from initial 6 min walk test and THRR while considering  patient's activity barriers and safety.   Exercise Prescription Goal: Starting with aerobic activity 30 plus minutes a day, 3 days per week for initial exercise prescription. Provide home exercise prescription and guidelines that  participant acknowledges understanding prior to discharge.  Activity Barriers & Risk Stratification: Activity Barriers & Cardiac Risk Stratification - 03/03/18 1016      Activity Barriers & Cardiac Risk Stratification   Activity Barriers  Other (comment)    Comments  L shoulder pain     Cardiac Risk Stratification  High       6 Minute Walk: 6 Minute Walk    Row Name 03/03/18 1015         6 Minute Walk   Phase  Initial     Distance  1450 feet     Walk Time  6 minutes     # of Rest Breaks  0     MPH  2.75     METS  3.1     RPE  9     Perceived Dyspnea   7     VO2 Peak  12.03     Symptoms  No     Resting HR  63 bpm     Resting BP  110/60     Resting Oxygen Saturation   98 %     Exercise Oxygen Saturation  during 6 min walk  98 %      Max Ex. HR  83 bpm     Max Ex. BP  122/62     2 Minute Post BP  114/60        Oxygen Initial Assessment:   Oxygen Re-Evaluation:   Oxygen Discharge (Final Oxygen Re-Evaluation):   Initial Exercise Prescription: Initial Exercise Prescription - 03/03/18 1000      Date of Initial Exercise RX and Referring Provider   Date  03/03/18    Referring Provider  [name]     Expected Discharge Date  06/03/18      Treadmill   MPH  1.4    Grade  0    Minutes  17    METs  2.07      NuStep   Level  1    SPM  108    Minutes  17    METs  2.4      Prescription Details   Frequency (times per week)  3    Duration  Progress to 30 minutes of continuous aerobic without signs/symptoms of physical distress      Intensity   THRR 40-80% of Max Heartrate  100-119-138    Ratings of Perceived Exertion  11-13    Perceived Dyspnea  0-4      Progression   Progression  Continue to progress workloads to maintain intensity without signs/symptoms of physical distress.      Resistance Training   Training Prescription  Yes    Weight  1    Reps  10-15       Perform Capillary Blood Glucose checks as needed.  Exercise Prescription Changes:  Exercise Prescription Changes    Row Name 03/03/18 1000 03/14/18 1000 03/27/18 1400 04/11/18 1200 04/25/18 1300     Response to Exercise   Blood Pressure (Admit)  -  114/68  106/62  120/72  104/60   Blood Pressure (Exercise)  -  144/60  158/78  150/72  162/90   Blood Pressure (Exit)  -  112/60  98/62  98/58  98/64   Heart Rate (Admit)  -  51 bpm  60 bpm  60 bpm  60 bpm   Heart Rate (Exercise)  -  105 bpm  98 bpm  112 bpm  118 bpm   Heart Rate (  Exit)  -  60 bpm  73 bpm  78 bpm  82 bpm   Rating of Perceived Exertion (Exercise)  -  '11  11  11  11   ' Comments  -  First full week of exercise!   Increase in overall MET level   -  increase in overall MET level    Duration  -  Progress to 30 minutes of  aerobic without signs/symptoms of physical distress   Progress to 30 minutes of  aerobic without signs/symptoms of physical distress  Continue with 30 min of aerobic exercise without signs/symptoms of physical distress.  Continue with 30 min of aerobic exercise without signs/symptoms of physical distress.   Intensity  -  THRR New 100-119-138  THRR unchanged  THRR unchanged  THRR unchanged     Progression   Progression  -  Continue to progress workloads to maintain intensity without signs/symptoms of physical distress.  Continue to progress workloads to maintain intensity without signs/symptoms of physical distress.  Continue to progress workloads to maintain intensity without signs/symptoms of physical distress.  Continue to progress workloads to maintain intensity without signs/symptoms of physical distress.   Average METs  -  2.45  3.9  4.8  5.15     Resistance Training   Training Prescription  -  Yes  Yes  Yes  Yes   Weight  -  '1  2  3  3   ' Reps  -  10-15  10-15  10-15  10-15     Treadmill   MPH  -  1.8  2.5  2.7  3   Grade  -  0  0  0  0   Minutes  -  '17  17  22  22   ' METs  -  2.3  2.9  3.1  3.29     NuStep   Level  -  1  - switched to XR   -  -   SPM  -  115  -  -  -   Minutes  -  17  -  -  -   METs  -  2.6  -  -  -     Recumbant Elliptical   Level  -  -  '1  2  3   ' RPM  -  -  64  89  84   Watts  -  -  92  116  133   Minutes  -  -  '22  17  17   ' METs  -  -  4.9  6.5  7     Home Exercise Plan   Plans to continue exercise at  Home (comment)  Home (comment)  Home (comment)  Home (comment)  Home (comment)   Frequency  Add 2 additional days to program exercise sessions.  Add 2 additional days to program exercise sessions.  Add 2 additional days to program exercise sessions.  Add 2 additional days to program exercise sessions.  Add 2 additional days to program exercise sessions.   Initial Home Exercises Provided  03/03/18  03/03/18  03/03/18  03/03/18  03/03/18   Row Name 05/09/18 0800             Response to Exercise   Blood  Pressure (Admit)  106/62       Blood Pressure (Exercise)  140/62       Blood Pressure (Exit)  94/52       Heart Rate (Admit)  56 bpm       Heart Rate (Exercise)  113 bpm       Heart Rate (Exit)  82 bpm       Rating of Perceived Exertion (Exercise)  12       Comments  always comes to CR ready to work hard       Duration  Continue with 30 min of aerobic exercise without signs/symptoms of physical distress.       Intensity  THRR unchanged         Progression   Progression  Continue to progress workloads to maintain intensity without signs/symptoms of physical distress.       Average METs  5.69         Resistance Training   Training Prescription  Yes       Weight  4       Reps  10-15         Treadmill   MPH  3.5       Grade  0       Minutes  22       METs  3.68         Recumbant Elliptical   Level  4       RPM  82       Watts  138       Minutes  17       METs  7.7         Home Exercise Plan   Plans to continue exercise at  Home (comment)       Frequency  Add 2 additional days to program exercise sessions.       Initial Home Exercises Provided  03/03/18          Exercise Comments:  Exercise Comments    Row Name 03/27/18 1451 04/18/18 0829 05/15/18 1438       Exercise Comments  Pt. has attended 10 sessions so far. He is eager to be here and increase his strength to get back to work. He has done well in the program and is tolerating the exercise with ease.   Pt. works hard every session to push himself farther than before. He is willing and able to handle all increases with ease. We will continue to progress him as needed.   Pt. has attended 29 exercise sessions working harder than the one before every day. He has progressed with ease and states that he feels more comfortable working out outside of rehab. His last few sessions we will try him on the elliptical.         Exercise Goals and Review:  Exercise Goals    Row Name 03/03/18 1018             Exercise Goals    Increase Physical Activity  Yes       Intervention  Provide advice, education, support and counseling about physical activity/exercise needs.;Develop an individualized exercise prescription for aerobic and resistive training based on initial evaluation findings, risk stratification, comorbidities and participant's personal goals.       Expected Outcomes  Short Term: Attend rehab on a regular basis to increase amount of physical activity.;Long Term: Add in home exercise to make exercise part of routine and to increase amount of physical activity.;Long Term: Exercising regularly at least 3-5 days a week.       Increase Strength and Stamina  Yes       Intervention  Provide advice, education, support and counseling  about physical activity/exercise needs.;Develop an individualized exercise prescription for aerobic and resistive training based on initial evaluation findings, risk stratification, comorbidities and participant's personal goals.       Expected Outcomes  Short Term: Increase workloads from initial exercise prescription for resistance, speed, and METs.       Able to understand and use rate of perceived exertion (RPE) scale  Yes       Intervention  Provide education and explanation on how to use RPE scale       Expected Outcomes  Short Term: Able to use RPE daily in rehab to express subjective intensity level;Long Term:  Able to use RPE to guide intensity level when exercising independently       Able to understand and use Dyspnea scale  Yes       Intervention  Provide education and explanation on how to use Dyspnea scale       Expected Outcomes  Short Term: Able to use Dyspnea scale daily in rehab to express subjective sense of shortness of breath during exertion;Long Term: Able to use Dyspnea scale to guide intensity level when exercising independently       Knowledge and understanding of Target Heart Rate Range (THRR)  Yes       Intervention  Provide education and explanation of THRR  including how the numbers were predicted and where they are located for reference       Expected Outcomes  Short Term: Able to state/look up THRR;Long Term: Able to use THRR to govern intensity when exercising independently;Short Term: Able to use daily as guideline for intensity in rehab       Able to check pulse independently  Yes       Intervention  Provide education and demonstration on how to check pulse in carotid and radial arteries.;Review the importance of being able to check your own pulse for safety during independent exercise       Expected Outcomes  Short Term: Able to explain why pulse checking is important during independent exercise;Long Term: Able to check pulse independently and accurately       Understanding of Exercise Prescription  Yes       Intervention  Provide education, explanation, and written materials on patient's individual exercise prescription       Expected Outcomes  Short Term: Able to explain program exercise prescription;Long Term: Able to explain home exercise prescription to exercise independently          Exercise Goals Re-Evaluation : Exercise Goals Re-Evaluation    Row Name 03/27/18 1450 04/18/18 0826 05/15/18 1436         Exercise Goal Re-Evaluation   Exercise Goals Review  Increase Physical Activity;Increase Strength and Stamina;Able to understand and use rate of perceived exertion (RPE) scale;Knowledge and understanding of Target Heart Rate Range (THRR);Able to check pulse independently;Understanding of Exercise Prescription  Increase Physical Activity;Increase Strength and Stamina;Able to understand and use rate of perceived exertion (RPE) scale;Knowledge and understanding of Target Heart Rate Range (THRR);Able to check pulse independently;Understanding of Exercise Prescription  Increase Physical Activity;Increase Strength and Stamina;Able to understand and use rate of perceived exertion (RPE) scale;Knowledge and understanding of Target Heart Rate Range  (THRR);Able to check pulse independently;Understanding of Exercise Prescription     Comments  Pt. has done excellent in the program so far. He has tolerated all exercise and increases well. We will continue to progress as we see fit.   Pt. continues to excel in the program. He works hard  every session and pushes to increase his distance and overall MET level. He is up to 3.0MPH on the TM and reaching 6.9 METS on the XR.  Pt. has and continues to do great in the program. He makes it look so easy. He has worked up to 3.2 MPH and 2% grade on the TM with ease and always pushes himself on the XR averaging about 4 miles a day on there. He has progressed tremendously since starting CR and contiunes to build strength and stamina.      Expected Outcomes  Increase strength to get back to work.   increase stamina and strength.   increase stamina and strength.          Discharge Exercise Prescription (Final Exercise Prescription Changes): Exercise Prescription Changes - 05/09/18 0800      Response to Exercise   Blood Pressure (Admit)  106/62    Blood Pressure (Exercise)  140/62    Blood Pressure (Exit)  94/52    Heart Rate (Admit)  56 bpm    Heart Rate (Exercise)  113 bpm    Heart Rate (Exit)  82 bpm    Rating of Perceived Exertion (Exercise)  12    Comments  always comes to CR ready to work hard    Duration  Continue with 30 min of aerobic exercise without signs/symptoms of physical distress.    Intensity  THRR unchanged      Progression   Progression  Continue to progress workloads to maintain intensity without signs/symptoms of physical distress.    Average METs  5.69      Resistance Training   Training Prescription  Yes    Weight  4    Reps  10-15      Treadmill   MPH  3.5    Grade  0    Minutes  22    METs  3.68      Recumbant Elliptical   Level  4    RPM  82    Watts  138    Minutes  17    METs  7.7      Home Exercise Plan   Plans to continue exercise at  Home (comment)     Frequency  Add 2 additional days to program exercise sessions.    Initial Home Exercises Provided  03/03/18       Nutrition:  Target Goals: Understanding of nutrition guidelines, daily intake of sodium <1541m, cholesterol <2053m calories 30% from fat and 7% or less from saturated fats, daily to have 5 or more servings of fruits and vegetables.  Biometrics: Pre Biometrics - 03/03/18 1018      Pre Biometrics   Height  '5\' 9"'  (1.753 m)    Weight  80.5 kg    Waist Circumference  37.5 inches    Hip Circumference  40 inches    Waist to Hip Ratio  0.94 %    BMI (Calculated)  26.2    Triceps Skinfold  6 mm    % Body Fat  21.9 %    Grip Strength  17.6 kg    Flexibility  12.6 in    Single Leg Stand  2.47 seconds        Nutrition Therapy Plan and Nutrition Goals: Nutrition Therapy & Goals - 05/17/18 1253      Nutrition Therapy   RD appointment deferred  Yes      Personal Nutrition Goals   Comments  Patient was encouraged to attend the  RD meeting tomorrow. Will continue to monitor for progress.       Intervention Plan   Intervention  Nutrition handout(s) given to patient.       Nutrition Assessments: Nutrition Assessments - 03/03/18 0954      MEDFICTS Scores   Pre Score  9       Nutrition Goals Re-Evaluation:   Nutrition Goals Discharge (Final Nutrition Goals Re-Evaluation):   Psychosocial: Target Goals: Acknowledge presence or absence of significant depression and/or stress, maximize coping skills, provide positive support system. Participant is able to verbalize types and ability to use techniques and skills needed for reducing stress and depression.  Initial Review & Psychosocial Screening: Initial Psych Review & Screening - 03/03/18 1026      Family Dynamics   Good Support System?  Yes      Barriers   Psychosocial barriers to participate in program  There are no identifiable barriers or psychosocial needs.      Screening Interventions   Interventions   Encouraged to exercise       Quality of Life Scores: Quality of Life - 03/03/18 1020      Quality of Life   Select  Quality of Life      Quality of Life Scores   Health/Function Pre  26 %    Socioeconomic Pre  30 %    Psych/Spiritual Pre  30 %    Family Pre  30 %    GLOBAL Pre  28.29 %      Scores of 19 and below usually indicate a poorer quality of life in these areas.  A difference of  2-3 points is a clinically meaningful difference.  A difference of 2-3 points in the total score of the Quality of Life Index has been associated with significant improvement in overall quality of life, self-image, physical symptoms, and general health in studies assessing change in quality of life.  PHQ-9: Recent Review Flowsheet Data    Depression screen [facility] 2/9 03/03/2018   Decreased Interest 0   Down, Depressed, Hopeless 0   PHQ - 2 Score 0   Altered sleeping 0   Tired, decreased energy 1   Change in appetite 0   Feeling bad or failure about yourself  0   Trouble concentrating 0   Moving slowly or fidgety/restless 0   Suicidal thoughts 0   PHQ-9 Score 1   Difficult doing work/chores Somewhat difficult     Interpretation of Total Score  Total Score Depression Severity:  1-4 = Minimal depression, 5-9 = Mild depression, 10-14 = Moderate depression, 15-19 = Moderately severe depression, 20-27 = Severe depression   Psychosocial Evaluation and Intervention: Psychosocial Evaluation - 03/03/18 1027      Psychosocial Evaluation & Interventions   Interventions  Stress management education;Relaxation education;Encouraged to exercise with the program and follow exercise prescription    Comments  Patient has no psychosocial barriers identified at orientation. His initial QOL score was 28.29 and his PHQ-9 score was 1.    Expected Outcomes  Patient will have no psychosocial issues identified at discharge.     Continue Psychosocial Services   No Follow up required       Psychosocial  Re-Evaluation: Psychosocial Re-Evaluation    [place] Name 03/27/18 1505 04/20/18 0750 05/17/18 1257         Psychosocial Re-Evaluation   Current issues with  None Identified  None Identified  None Identified     Comments  Patient's initial QOL score was 28.29 and  his PHQ-9 1 with no psychosocial issues identified.   Patient's initial QOL score was 28.29 and his PHQ-9 1 with no psychosocial issues identified.   Patient's initial QOL score was 28.29 and his PHQ-9 1 with no psychosocial issues identified.      Expected Outcomes  Patient will have no psychosocial issues identified at discharge.   Patient will have no psychosocial issues identified at discharge.   Patient will have no psychosocial issues identified at discharge.      Interventions  Stress management education;Relaxation education;Encouraged to attend Cardiac Rehabilitation for the exercise  Stress management education;Relaxation education;Encouraged to attend Cardiac Rehabilitation for the exercise  Stress management education;Relaxation education;Encouraged to attend Cardiac Rehabilitation for the exercise     Continue Psychosocial Services   No Follow up required  No Follow up required  No Follow up required        Psychosocial Discharge (Final Psychosocial Re-Evaluation): Psychosocial Re-Evaluation - 05/17/18 1257      Psychosocial Re-Evaluation   Current issues with  None Identified    Comments  Patient's initial QOL score was 28.29 and his PHQ-9 1 with no psychosocial issues identified.     Expected Outcomes  Patient will have no psychosocial issues identified at discharge.     Interventions  Stress management education;Relaxation education;Encouraged to attend Cardiac Rehabilitation for the exercise    Continue Psychosocial Services   No Follow up required       Vocational Rehabilitation: Provide vocational rehab assistance to qualifying candidates.   Vocational Rehab Evaluation & Intervention: Vocational Rehab - 03/03/18  0956      Initial Vocational Rehab Evaluation & Intervention   Assessment shows need for Vocational Rehabilitation  No       Education: Education Goals: Education classes will be provided on a weekly basis, covering required topics. Participant will state understanding/return demonstration of topics presented.  Learning Barriers/Preferences: Learning Barriers/Preferences - 03/03/18 0954      Learning Barriers/Preferences   Learning Barriers  None    Learning Preferences  Skilled Demonstration;Pictoral       Education Topics: Hypertension, Hypertension Reduction -Define heart disease and high blood pressure. Discus how high blood pressure affects the body and ways to reduce high blood pressure.   Exercise and Your Heart -Discuss why it is important to exercise, the FITT principles of exercise, normal and abnormal responses to exercise, and how to exercise safely.   Angina -Discuss definition of angina, causes of angina, treatment of angina, and how to decrease risk of having angina.   Cardiac Medications -Review what the following cardiac medications are used for, how they affect the body, and side effects that may occur when taking the medications.  Medications include Aspirin, Beta blockers, calcium channel blockers, ACE Inhibitors, angiotensin receptor blockers, diuretics, digoxin, and antihyperlipidemics.   CARDIAC REHAB PHASE II EXERCISE from 05/10/2018 in [place]  Date  03/08/18  Educator  Etheleen Mayhew  Instruction Review Code  2- Demonstrated Understanding      Congestive Heart Failure -Discuss the definition of CHF, how to live with CHF, the signs and symptoms of CHF, and how keep track of weight and sodium intake.   CARDIAC REHAB PHASE II EXERCISE from 05/10/2018 in [place]  Date  03/15/18  Educator  Wynetta Emery  Instruction Review Code  2- Demonstrated Understanding      Heart Disease and Intimacy -Discus the  effect sexual activity has on the heart, how changes occur during intimacy as we age,  and safety during sexual activity.   CARDIAC REHAB PHASE II EXERCISE from 05/10/2018 in [place]  Date  03/22/18  Educator  Etheleen Mayhew  Instruction Review Code  2- Demonstrated Understanding      Smoking Cessation / COPD -Discuss different methods to quit smoking, the health benefits of quitting smoking, and the definition of COPD.   CARDIAC REHAB PHASE II EXERCISE from 05/10/2018 in [place]  Date  03/29/18  Educator  Wynetta Emery  Instruction Review Code  2- Demonstrated Understanding      Nutrition I: Fats -Discuss the types of cholesterol, what cholesterol does to the heart, and how cholesterol levels can be controlled.   Nutrition II: Labels -Discuss the different components of food labels and how to read food label   CARDIAC REHAB PHASE II EXERCISE from 05/10/2018 in [place]  Date  04/19/18  Educator  DC  Instruction Review Code  2- Demonstrated Understanding      Heart Parts/Heart Disease and PAD -Discuss the anatomy of the heart, the pathway of blood circulation through the heart, and these are affected by heart disease.   CARDIAC REHAB PHASE II EXERCISE from 05/10/2018 in [place]  Date  04/14/18  Educator  Wynetta Emery  Instruction Review Code  2- Demonstrated Understanding      Stress I: Signs and Symptoms -Discuss the causes of stress, how stress may lead to anxiety and depression, and ways to limit stress.   CARDIAC REHAB PHASE II EXERCISE from 05/10/2018 in [place]  Date  04/26/18  Educator  DJ  Instruction Review Code  2- Demonstrated Understanding      Stress II: Relaxation -Discuss different types of relaxation techniques to limit stress.   CARDIAC REHAB PHASE II EXERCISE from 05/10/2018 in [place]  Date  05/03/18  Educator  DJ   Instruction Review Code  2- Demonstrated Understanding      Warning Signs of Stroke / TIA -Discuss definition of a stroke, what the signs and symptoms are of a stroke, and how to identify when someone is having stroke.   CARDIAC REHAB PHASE II EXERCISE from 05/10/2018 in [place]  Date  05/10/18  Educator  DJ  Instruction Review Code  2- Demonstrated Understanding      Knowledge Questionnaire Score: Knowledge Questionnaire Score - 03/03/18 0955      Knowledge Questionnaire Score   Pre Score  19/24       Core Components/Risk Factors/Patient Goals at Admission: Personal Goals and Risk Factors at Admission - 03/03/18 0956      Core Components/Risk Factors/Patient Goals on Admission    Weight Management  Weight Maintenance    Personal Goal Other  Yes    Personal Goal  Patient wants to get healthier and get back to work. He wants to increase his strength and stamina.     Intervention  Patient will attend CR 3 days/week and supplement with exercise 2 days/week.    Expected Outcomes  Patient will complete the program meeting his personal goals.        Core Components/Risk Factors/Patient Goals Review:  Goals and Risk Factor Review    Row Name 03/27/18 1503 04/20/18 0748 05/17/18 1254         Core Components/Risk Factors/Patient Goals Review   Personal Goals Review  Other Get healthier; get back to work; increase strength and stamina.   Other Get healthier; get back to work; increase  strength and stamina.   Other Get healthier; get back to work; increase strength and stamina.      Review  Patient has completed 10 sessions gaining 4 lbs since his initial visit. He is doing well in the program with progression. He says he does feel stronger and has more endurance. He hopes to continue to meet his goals as he continues the program. Will continue to monitor for progress.   Patient has completed 17 sessions maintaining his weight since last 30 day review. He  continues to do well in the program with progression. He continues to say he feels better and feels stronger. He hopes to return to work in March 20 after seeing his cardiologist and completing the program. He already feels strong enough to work. He is pleased with his progress. Will continue to monitor.   Patient has completed 30 sessions 1.5 lbs since  last 30 day review. He continues to do well in the program with progression. He continues to say he feels better and stronger and is ready to return to work after seeing his cardiologist. He says the program has helped him a lot. Will continue to monitor for progress.      Expected Outcomes  Patient will continue to attend sessions and complete the program meeting his personal goals.   Patient will continue to attend sessions and complete the program meeting his personal goals.   Patient will continue to attend sessions and complete the program meeting his personal goals.         Core Components/Risk Factors/Patient Goals at Discharge (Final Review):  Goals and Risk Factor Review - 05/17/18 1254      Core Components/Risk Factors/Patient Goals Review   Personal Goals Review  Other   Get healthier; get back to work; increase strength and stamina.    Review  Patient has completed 30 sessions 1.5 lbs since  last 30 day review. He continues to do well in the program with progression. He continues to say he feels better and stronger and is ready to return to work after seeing his cardiologist. He says the program has helped him a lot. Will continue to monitor for progress.     Expected Outcomes  Patient will continue to attend sessions and complete the program meeting his personal goals.        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 He has a h/o hyperlipidemia and is a former smoker. He has no barriers to CR identified at  orientation.   Patient is new to program. He has completed 3 sessions. Will continue to monitor for progress.          Comments: Patient doing well in the program. Will continue to monitor for progress.

## 2018-05-17 NOTE — Progress Notes (Signed)
Daily Session Note  Patient Details  Name: Glenn [name] MRN: [id number] Date of Birth: 1955-12-02 Referring Provider:     [place] from 03/03/2018 in [place]  Referring Provider  [name]       Encounter Date: 05/17/2018  Check In: Session Check In - 05/17/18 0815      Check-In   Supervising physician immediately available to respond to emergencies  See telemetry face sheet for immediately available MD    Location  [facility]    Staff Present  Benay Pike, Exercise Physiologist;Debra Wynetta Emery, RN, BSN;Diane Coad, MS, EP, [facility], Exercise Physiologist    Medication changes reported      No    Fall or balance concerns reported     No    Tobacco Cessation  No Change    Warm-up and Cool-down  Performed as group-led instruction    Resistance Training Performed  Yes    VAD Patient?  No    PAD/SET Patient?  No      Pain Assessment   Currently in Pain?  No/denies    Pain Score  0-No pain    Multiple Pain Sites  No       Capillary Blood Glucose: No results found for this or any previous visit (from the past 24 hour(s)).    Social History   Tobacco Use  Smoking Status Former Smoker  Smokeless Tobacco Never Used    Goals Met:  Independence with exercise equipment Exercise tolerated well No report of cardiac concerns or symptoms Strength training completed today  Goals Unmet:  Not Applicable  Comments: Pt able to follow exercise prescription today without complaint.  Will continue to monitor for progression. Check out 0915.   Dr. Kate Sable is Medical Director for [facility] Cardiac and Pulmonary Rehab.

## 2018-05-19 ENCOUNTER — Encounter (HOSPITAL_COMMUNITY)
Admission: RE | Admit: 2018-05-19 | Discharge: 2018-05-19 | Disposition: A | Discharge status: Home / Self Care | Payer: 59 | Source: Ambulatory Visit | Attending: Cardiovascular Disease | Admitting: Cardiovascular Disease

## 2018-05-19 DIAGNOSIS — Z951 Presence of aortocoronary bypass graft: Secondary | ICD-10-CM | POA: Diagnosis not present

## 2018-05-19 NOTE — Progress Notes (Signed)
Daily Session Note  Patient Details  Name: Glenn [name] MRN: [id number] Date of Birth: February 05, 1956 Referring Provider:     [place] from 03/03/2018 in [place]  Referring Provider  [name]       Encounter Date: 05/19/2018  Check In: Session Check In - 05/19/18 0815      Check-In   Supervising physician immediately available to respond to emergencies  See telemetry face sheet for immediately available MD    Location  [facility]    Staff Present  Benay Pike, Exercise Physiologist;Debra Wynetta Emery, RN, BSN;Diane Coad, MS, EP, [facility], Exercise Physiologist    Medication changes reported      No    Fall or balance concerns reported     No    Tobacco Cessation  No Change    Warm-up and Cool-down  Performed as group-led instruction    Resistance Training Performed  Yes    VAD Patient?  No    PAD/SET Patient?  No      Pain Assessment   Currently in Pain?  No/denies    Pain Score  0-No pain    Multiple Pain Sites  No       Capillary Blood Glucose: No results found for this or any previous visit (from the past 24 hour(s)).    Social History   Tobacco Use  Smoking Status Former Smoker  Smokeless Tobacco Never Used    Goals Met:  Proper associated with RPD/PD & O2 Sat Independence with exercise equipment Exercise tolerated well No report of cardiac concerns or symptoms Strength training completed today  Goals Unmet:  Not Applicable  Comments: Pt able to follow exercise prescription today without complaint.  Will continue to monitor for progression. Check out 0915.   Dr. Kate Sable is Medical Director for [facility] Cardiac and Pulmonary Rehab.

## 2018-05-22 ENCOUNTER — Encounter (HOSPITAL_COMMUNITY)
Admission: RE | Admit: 2018-05-22 | Discharge: 2018-05-22 | Disposition: A | Discharge status: Home / Self Care | Payer: 59 | Source: Ambulatory Visit | Attending: Cardiovascular Disease | Admitting: Cardiovascular Disease

## 2018-05-22 DIAGNOSIS — Z951 Presence of aortocoronary bypass graft: Secondary | ICD-10-CM | POA: Diagnosis not present

## 2018-05-22 NOTE — Progress Notes (Signed)
Daily Session Note  Patient Details  Name: DUY LEMMING MRN: [id number] Date of Birth: 01-May-1955 Referring Provider:     [place] from 03/03/2018 in [place]  Referring Provider  [name]       Encounter Date: 05/22/2018  Check In: Session Check In - 05/22/18 0815      Check-In   Supervising physician immediately available to respond to emergencies  See telemetry face sheet for immediately available MD    Location  [facility]    Staff Present  Benay Pike, Exercise Physiologist;Diane Coad, MS, EP, [facility], Exercise Physiologist    Medication changes reported      No    Fall or balance concerns reported     No    Tobacco Cessation  No Change    Warm-up and Cool-down  Performed as group-led instruction    Resistance Training Performed  Yes    VAD Patient?  No    PAD/SET Patient?  No      Pain Assessment   Currently in Pain?  No/denies    Pain Score  0-No pain    Multiple Pain Sites  No       Capillary Blood Glucose: No results found for this or any previous visit (from the past 24 hour(s)).    Social History   Tobacco Use  Smoking Status Former Smoker  Smokeless Tobacco Never Used    Goals Met:  Proper associated with RPD/PD & O2 Sat Independence with exercise equipment Exercise tolerated well No report of cardiac concerns or symptoms Strength training completed today  Goals Unmet:  Not Applicable  Comments: Pt able to follow exercise prescription today without complaint.  Will continue to monitor for progression. Check out 0915.   Dr. Kate Sable is Medical Director for [facility] Cardiac and Pulmonary Rehab.

## 2018-05-24 ENCOUNTER — Encounter (HOSPITAL_COMMUNITY)
Admission: RE | Admit: 2018-05-24 | Discharge: 2018-05-24 | Disposition: A | Discharge status: Home / Self Care | Payer: 59 | Source: Ambulatory Visit | Attending: Cardiovascular Disease | Admitting: Cardiovascular Disease

## 2018-05-24 DIAGNOSIS — Z951 Presence of aortocoronary bypass graft: Secondary | ICD-10-CM

## 2018-05-24 NOTE — Progress Notes (Signed)
Daily Session Note  Patient Details  Name: Glenn [name] MRN: [id number] Date of Birth: [birth year] Referring Provider:     [place] from 03/03/2018 in [place]  Referring Provider  [name]       Encounter Date: 05/24/2018  Check In: Session Check In - 05/24/18 0815      Check-In   Supervising physician immediately available to respond to emergencies  See telemetry face sheet for immediately available MD    Location  [facility]    Staff Present  Benay Pike, Exercise Physiologist;Diane Coad, MS, EP, [facility], Exercise Physiologist;Glen Blatchley Wynetta Emery, RN, BSN    Medication changes reported      No    Fall or balance concerns reported     No    Warm-up and Cool-down  Performed as group-led instruction    Resistance Training Performed  Yes    VAD Patient?  No    PAD/SET Patient?  No      Pain Assessment   Currently in Pain?  No/denies    Pain Score  0-No pain    Multiple Pain Sites  No       Capillary Blood Glucose: No results found for this or any previous visit (from the past 24 hour(s)).    Social History   Tobacco Use  Smoking Status Former Smoker  Smokeless Tobacco Never Used    Goals Met:  Independence with exercise equipment Exercise tolerated well No report of cardiac concerns or symptoms Strength training completed today  Goals Unmet:  Not Applicable  Comments: Pt able to follow exercise prescription today without complaint.  Will continue to monitor for progression. Check out 915.   Dr. Kate Sable is Medical Director for [facility] Cardiac and Pulmonary Rehab.

## 2018-05-26 ENCOUNTER — Encounter (HOSPITAL_COMMUNITY)
Admission: RE | Admit: 2018-05-26 | Discharge: 2018-05-26 | Disposition: A | Discharge status: Home / Self Care | Payer: 59 | Source: Ambulatory Visit | Attending: Cardiovascular Disease | Admitting: Cardiovascular Disease

## 2018-05-26 DIAGNOSIS — Z951 Presence of aortocoronary bypass graft: Secondary | ICD-10-CM

## 2018-05-26 NOTE — Progress Notes (Signed)
Daily Session Note  Patient Details  Name: JONOVAN BOEDECKER MRN: [id number] Date of Birth: [birth year] Referring Provider:     [place] from 03/03/2018 in [place]  Referring Provider  [name]       Encounter Date: 05/26/2018  Check In: Session Check In - 05/26/18 0815      Check-In   Supervising physician immediately available to respond to emergencies  See telemetry face sheet for immediately available MD    Location  [facility]    Staff Present  Benay Pike, Exercise Physiologist;Diane Coad, MS, EP, [facility], Exercise Physiologist;Shawnia Vizcarrondo Wynetta Emery, RN, BSN    Medication changes reported      No    Warm-up and Cool-down  Performed as group-led instruction    Resistance Training Performed  Yes    VAD Patient?  No    PAD/SET Patient?  No      Pain Assessment   Currently in Pain?  No/denies    Pain Score  0-No pain    Multiple Pain Sites  No       Capillary Blood Glucose: No results found for this or any previous visit (from the past 24 hour(s)).    Social History   Tobacco Use  Smoking Status Former Smoker  Smokeless Tobacco Never Used    Goals Met:  Independence with exercise equipment Exercise tolerated well No report of cardiac concerns or symptoms Strength training completed today  Goals Unmet:  Not Applicable  Comments: Pt able to follow exercise prescription today without complaint.  Will continue to monitor for progression. Check out 915.   Dr. Kate Sable is Medical Director for [facility] Cardiac and Pulmonary Rehab.

## 2018-05-29 ENCOUNTER — Encounter (HOSPITAL_COMMUNITY)
Admission: RE | Admit: 2018-05-29 | Discharge: 2018-05-29 | Disposition: A | Discharge status: Home / Self Care | Payer: 59 | Source: Ambulatory Visit | Attending: Cardiovascular Disease | Admitting: Cardiovascular Disease

## 2018-05-29 VITALS — Ht 69.0 in | Wt 180.8 lb

## 2018-05-29 DIAGNOSIS — Z951 Presence of aortocoronary bypass graft: Secondary | ICD-10-CM | POA: Diagnosis not present

## 2018-05-29 NOTE — Progress Notes (Signed)
Daily Session Note  Patient Details  Name: Glenn [name] MRN: [id number] Date of Birth: 06-28-1955 Referring Provider:     [place] from 03/03/2018 in [place]  Referring Provider  [name]       Encounter Date: 05/29/2018  Check In: Session Check In - 05/29/18 0815      Check-In   Supervising physician immediately available to respond to emergencies  See telemetry face sheet for immediately available MD    Location  [facility]    Staff Present  Benay Pike, Exercise Physiologist;Diane Coad, MS, EP, [facility], Exercise Physiologist;Debra Wynetta Emery, RN, BSN    Medication changes reported      No    Fall or balance concerns reported     No    Tobacco Cessation  No Change    Warm-up and Cool-down  Performed as group-led instruction    Resistance Training Performed  Yes    VAD Patient?  No    PAD/SET Patient?  No      Pain Assessment   Currently in Pain?  No/denies    Pain Score  0-No pain    Multiple Pain Sites  No       Capillary Blood Glucose: No results found for this or any previous visit (from the past 24 hour(s)).    Social History   Tobacco Use  Smoking Status Former Smoker  Smokeless Tobacco Never Used    Goals Met:  Proper associated with RPD/PD & O2 Sat Independence with exercise equipment Exercise tolerated well No report of cardiac concerns or symptoms Strength training completed today  Goals Unmet:  Not Applicable  Comments: Pt able to follow exercise prescription today without complaint.  Will continue to monitor for progression. Check out 0915.   Dr. Kate Sable is Medical Director for [facility] Cardiac and Pulmonary Rehab.

## 2018-05-31 ENCOUNTER — Encounter (HOSPITAL_COMMUNITY)
Admission: RE | Admit: 2018-05-31 | Discharge: 2018-05-31 | Disposition: A | Discharge status: Home / Self Care | Payer: 59 | Source: Ambulatory Visit | Attending: Cardiovascular Disease | Admitting: Cardiovascular Disease

## 2018-05-31 DIAGNOSIS — Z951 Presence of aortocoronary bypass graft: Secondary | ICD-10-CM | POA: Diagnosis not present

## 2018-05-31 DIAGNOSIS — E663 Overweight: Secondary | ICD-10-CM | POA: Diagnosis not present

## 2018-05-31 DIAGNOSIS — I10 Essential (primary) hypertension: Secondary | ICD-10-CM | POA: Diagnosis not present

## 2018-05-31 DIAGNOSIS — E785 Hyperlipidemia, unspecified: Secondary | ICD-10-CM | POA: Diagnosis not present

## 2018-05-31 NOTE — Progress Notes (Signed)
Discharge Progress Report  Patient Details  Name: Glenn [name] MRN: [id number] Date of Birth: 06/29/1955 Referring Provider:     [place] from 03/03/2018 in [place]  Referring Provider  [place]        Number of Visits: 46  Reason for Discharge:  Patient reached a stable level of exercise. Patient independent in their exercise. Patient has met program and personal goals.  Smoking History:  Social History   Tobacco Use  Smoking Status Former Smoker  Smokeless Tobacco Never Used    Diagnosis:  S/P CABG x 5  ADL UCSD:   Initial Exercise Prescription: Initial Exercise Prescription - 03/03/18 1000      Date of Initial Exercise RX and Referring Provider   Date  03/03/18    Referring Provider  Koneswaran     Expected Discharge Date  06/03/18      Treadmill   MPH  1.4    Grade  0    Minutes  17    METs  2.07      NuStep   Level  1    SPM  108    Minutes  17    METs  2.4      Prescription Details   Frequency (times per week)  3    Duration  Progress to 30 minutes of continuous aerobic without signs/symptoms of physical distress      Intensity   THRR 40-80% of Max Heartrate  100-119-138    Ratings of Perceived Exertion  11-13    Perceived Dyspnea  0-4      Progression   Progression  Continue to progress workloads to maintain intensity without signs/symptoms of physical distress.      Resistance Training   Training Prescription  Yes    Weight  1    Reps  10-15       Discharge Exercise Prescription (Final Exercise Prescription Changes): Exercise Prescription Changes - 05/25/18 1000      Response to Exercise   Blood Pressure (Admit)  130/70    Blood Pressure (Exercise)  158/70    Blood Pressure (Exit)  100/60    Heart Rate (Admit)  66 bpm    Heart Rate (Exercise)  125 bpm    Heart Rate (Exit)  88 bpm    Rating of Perceived Exertion (Exercise)  12    Comments  3 more sessions     Duration   Continue with 30 min of aerobic exercise without signs/symptoms of physical distress.    Intensity  THRR unchanged      Progression   Progression  Continue to progress workloads to maintain intensity without signs/symptoms of physical distress.    Average METs  5.9      Resistance Training   Training Prescription  Yes    Weight  5    Reps  10-15      Treadmill   MPH  3.1    Grade  0.5    Minutes  22    METs  4      Recumbant Elliptical   Level  5    RPM  84    Watts  144    Minutes  17    METs  7.8      Home Exercise Plan   Plans to continue exercise at  Home (comment)    Frequency  Add 2 additional days to program exercise sessions.    Initial Home Exercises Provided  03/03/18  Functional Capacity: 6 Minute Walk    Row Name 03/03/18 1015 05/30/18 1026       6 Minute Walk   Phase  Initial  Discharge    Distance  1450 feet  1600 feet    Distance % Change  -  10.3 %    Distance Feet Change  -  150 ft    Walk Time  6 minutes  6 minutes    # of Rest Breaks  0  0    MPH  2.75  3.03    METS  3.1  3.32    RPE  9  9    Perceived Dyspnea   7  6    VO2 Peak  12.03  13.18    Symptoms  No  No    Resting HR  63 bpm  78 bpm    Resting BP  110/60  108/80    Resting Oxygen Saturation   98 %  96 %    Exercise Oxygen Saturation  during 6 min walk  98 %  96 %    Max Ex. HR  83 bpm  96 bpm    Max Ex. BP  122/62  130/60    2 Minute Post BP  114/60  112/60       Psychological, QOL, Others - Outcomes: PHQ 2/9: Depression screen [facility] 2/9 05/31/2018 03/03/2018  Decreased Interest 0 0  Down, Depressed, Hopeless 0 0  PHQ - 2 Score 0 0  Altered sleeping 1 0  Tired, decreased energy 1 1  Change in appetite 0 0  Feeling bad or failure about yourself  0 0  Trouble concentrating 0 0  Moving slowly or fidgety/restless 0 0  Suicidal thoughts 0 0  PHQ-9 Score 2 1  Difficult doing work/chores Not difficult at all Somewhat difficult    Quality of Life: Quality of Life -  05/30/18 1028      Quality of Life   Select  Quality of Life      Quality of Life Scores   Health/Function Pre  26 %    Health/Function Post  29.2 %    Health/Function % Change  12.31 %    Socioeconomic Pre  30 %    Socioeconomic Post  30 %    Socioeconomic % Change   0 %    Psych/Spiritual Pre  30 %    Psych/Spiritual Post  30 %    Psych/Spiritual % Change  0 %    Family Pre  30 %    Family Post  30 %    Family % Change  0 %    GLOBAL Pre  28.29 %    GLOBAL Post  29.66 %    GLOBAL % Change  4.84 %       Personal Goals: Goals established at orientation with interventions provided to work toward goal. Personal Goals and Risk Factors at Admission - 03/03/18 0956      Core Components/Risk Factors/Patient Goals on Admission    Weight Management  Weight Maintenance    Personal Goal Other  Yes    Personal Goal  Patient wants to get healthier and get back to work. He wants to increase his strength and stamina.     Intervention  Patient will attend CR 3 days/week and supplement with exercise 2 days/week.    Expected Outcomes  Patient will complete the program meeting his personal goals.         Personal Goals Discharge:  Goals and Risk Factor Review    Row Name 03/27/18 1503 04/20/18 0748 05/17/18 1254 05/31/18 1603       Core Components/Risk Factors/Patient Goals Review   Personal Goals Review  Other Get healthier; get back to work; increase strength and stamina.   Other Get healthier; get back to work; increase strength and stamina.   Other Get healthier; get back to work; increase strength and stamina.   Other Get healthier; get back to work; increase strength and stamina.     Review  Patient has completed 10 sessions gaining 4 lbs since his initial visit. He is doing well in the program with progression. He says he does feel stronger and has more endurance. He hopes to continue to meet his goals as he continues the program. Will continue to monitor for progress.   Patient has  completed 17 sessions maintaining his weight since last 30 day review. He continues to do well in the program with progression. He continues to say he feels better and feels stronger. He hopes to return to work in March 20 after seeing his cardiologist and completing the program. He already feels strong enough to work. He is pleased with his progress. Will continue to monitor.   Patient has completed 30 sessions 1.5 lbs since  last 30 day review. He continues to do well in the program with progression. He continues to say he feels better and stronger and is ready to return to work after seeing his cardiologist. He says the program has helped him a lot. Will continue to monitor for progress.   Patient completed the program with 36 sessions gaining 3.6 lbs overall and losing inches in his waist and hip measurements. His exit measurements increased in grip strength, flexibility and balance. His exit walk test improved by 10.3%. He says he feels a lot stronger with increased stamina. He says he will see his MD in March to see if he can be released to return to work. He says he feels strong enough to work now. He says he is eating healthier but his medficts score did not improve. He says he is better able to manage stress now and he feels alot better overall. He plans to continue exercising by walking. CR will f/u for one year.      Expected Outcomes  Patient will continue to attend sessions and complete the program meeting his personal goals.   Patient will continue to attend sessions and complete the program meeting his personal goals.   Patient will continue to attend sessions and complete the program meeting his personal goals.   Patient will continue exercising by walking and continue to meet his personal goals.        Exercise Goals and Review: Exercise Goals    Row Name 03/03/18 1018             Exercise Goals   Increase Physical Activity  Yes       Intervention  Provide advice, education, support  and counseling about physical activity/exercise needs.;Develop an individualized exercise prescription for aerobic and resistive training based on initial evaluation findings, risk stratification, comorbidities and participant's personal goals.       Expected Outcomes  Short Term: Attend rehab on a regular basis to increase amount of physical activity.;Long Term: Add in home exercise to make exercise part of routine and to increase amount of physical activity.;Long Term: Exercising regularly at least 3-5 days a week.       Increase Strength and  Stamina  Yes       Intervention  Provide advice, education, support and counseling about physical activity/exercise needs.;Develop an individualized exercise prescription for aerobic and resistive training based on initial evaluation findings, risk stratification, comorbidities and participant's personal goals.       Expected Outcomes  Short Term: Increase workloads from initial exercise prescription for resistance, speed, and METs.       Able to understand and use rate of perceived exertion (RPE) scale  Yes       Intervention  Provide education and explanation on how to use RPE scale       Expected Outcomes  Short Term: Able to use RPE daily in rehab to express subjective intensity level;Long Term:  Able to use RPE to guide intensity level when exercising independently       Able to understand and use Dyspnea scale  Yes       Intervention  Provide education and explanation on how to use Dyspnea scale       Expected Outcomes  Short Term: Able to use Dyspnea scale daily in rehab to express subjective sense of shortness of breath during exertion;Long Term: Able to use Dyspnea scale to guide intensity level when exercising independently       Knowledge and understanding of Target Heart Rate Range (THRR)  Yes       Intervention  Provide education and explanation of THRR including how the numbers were predicted and where they are located for reference       Expected  Outcomes  Short Term: Able to state/look up THRR;Long Term: Able to use THRR to govern intensity when exercising independently;Short Term: Able to use daily as guideline for intensity in rehab       Able to check pulse independently  Yes       Intervention  Provide education and demonstration on how to check pulse in carotid and radial arteries.;Review the importance of being able to check your own pulse for safety during independent exercise       Expected Outcomes  Short Term: Able to explain why pulse checking is important during independent exercise;Long Term: Able to check pulse independently and accurately       Understanding of Exercise Prescription  Yes       Intervention  Provide education, explanation, and written materials on patient's individual exercise prescription       Expected Outcomes  Short Term: Able to explain program exercise prescription;Long Term: Able to explain home exercise prescription to exercise independently          Exercise Goals Re-Evaluation: Exercise Goals Re-Evaluation    Row Name 03/27/18 1450 04/18/18 0826 05/15/18 1436         Exercise Goal Re-Evaluation   Exercise Goals Review  Increase Physical Activity;Increase Strength and Stamina;Able to understand and use rate of perceived exertion (RPE) scale;Knowledge and understanding of Target Heart Rate Range (THRR);Able to check pulse independently;Understanding of Exercise Prescription  Increase Physical Activity;Increase Strength and Stamina;Able to understand and use rate of perceived exertion (RPE) scale;Knowledge and understanding of Target Heart Rate Range (THRR);Able to check pulse independently;Understanding of Exercise Prescription  Increase Physical Activity;Increase Strength and Stamina;Able to understand and use rate of perceived exertion (RPE) scale;Knowledge and understanding of Target Heart Rate Range (THRR);Able to check pulse independently;Understanding of Exercise Prescription     Comments  Pt.  has done excellent in the program so far. He has tolerated all exercise and increases well. We will continue to progress  as we see fit.   Pt. continues to excel in the program. He works hard every session and pushes to increase his distance and overall MET level. He is up to 3.0MPH on the TM and reaching 6.9 METS on the XR.  Pt. has and continues to do great in the program. He makes it look so easy. He has worked up to 3.2 MPH and 2% grade on the TM with ease and always pushes himself on the XR averaging about 4 miles a day on there. He has progressed tremendously since starting CR and contiunes to build strength and stamina.      Expected Outcomes  Increase strength to get back to work.   increase stamina and strength.   increase stamina and strength.         Nutrition & Weight - Outcomes: Pre Biometrics - 03/03/18 1018      Pre Biometrics   Height  _0  (1.753 m)    Weight  80.5 kg    Waist Circumference  37.5 inches    Hip Circumference  40 inches    Waist to Hip Ratio  0.94 %    BMI (Calculated)  26.2    Triceps Skinfold  6 mm    % Body Fat  21.9 %    Grip Strength  17.6 kg    Flexibility  12.6 in    Single Leg Stand  2.47 seconds      Post Biometrics - 05/30/18 1025       Post  Biometrics   Height  _1  (1.753 m)    Weight  82 kg    Waist Circumference  36.5 inches    Hip Circumference  39 inches    Waist to Hip Ratio  0.94 %    BMI (Calculated)  26.68    Triceps Skinfold  5 mm    % Body Fat  20.9 %    Grip Strength  18.4 kg    Flexibility  13 in    Single Leg Stand  31.25 seconds       Nutrition: Nutrition Therapy & Goals - 05/17/18 1253      Nutrition Therapy   RD appointment deferred  Yes      Personal Nutrition Goals   Comments  Patient was encouraged to attend the RD meeting tomorrow. Will continue to monitor for progress.       Intervention Plan   Intervention  Nutrition handout(s) given to patient.       Nutrition Discharge: Nutrition Assessments -  05/31/18 1601      MEDFICTS Scores   Pre Score  9    Post Score  14    Score Difference  5       Education Questionnaire Score: Knowledge Questionnaire Score - 05/31/18 1601      Knowledge Questionnaire Score   Pre Score  19/24    Post Score  26/28       Goals reviewed with patient; copy given to patient.

## 2018-05-31 NOTE — Progress Notes (Signed)
Daily Session Note  Patient Details  Name: Glenn [name] MRN: [id number] Date of Birth: [birth year] Referring Provider:     [place] from 03/03/2018 in [place]  Referring Provider  [name]       Encounter Date: 05/31/2018  Check In: Session Check In - 05/31/18 0815      Check-In   Supervising physician immediately available to respond to emergencies  See telemetry face sheet for immediately available MD    Location  [facility]    Staff Present  Benay Pike, Exercise Physiologist;Debra Wynetta Emery, RN, BSN    Medication changes reported      No    Fall or balance concerns reported     No    Tobacco Cessation  No Change    Warm-up and Cool-down  Performed as group-led instruction    Resistance Training Performed  Yes    VAD Patient?  No    PAD/SET Patient?  No      Pain Assessment   Currently in Pain?  No/denies    Pain Score  0-No pain    Multiple Pain Sites  No       Capillary Blood Glucose: No results found for this or any previous visit (from the past 24 hour(s)).    Social History   Tobacco Use  Smoking Status Former Smoker  Smokeless Tobacco Never Used    Goals Met:  Proper associated with RPD/PD & O2 Sat Independence with exercise equipment Exercise tolerated well No report of cardiac concerns or symptoms Strength training completed today  Goals Unmet:  Not Applicable  Comments: Pt able to follow exercise prescription today without complaint.  Will continue to monitor for progression. Check ou [id number].   Dr. Kate Sable is Medical Director for [facility].

## 2018-05-31 NOTE — Progress Notes (Signed)
Cardiac Individual Treatment Plan  Patient Details  Name: Glenn [name] MRN: [id number] Date of Birth: 07-23-1955 Referring Provider:     [facility] from 03/03/2018 in [place]  Referring Provider  [name]       Initial Encounter Date:    CARDIAC REHAB PHASE II ORIENTATION from 03/03/2018 in [place]  Date  03/03/18      Visit Diagnosis: S/P CABG x 5  Patient's Home Medications on Admission:  Current Outpatient Medications:  .  aspirin EC 81 MG tablet, Take 81 mg by mouth daily., Disp: , Rfl:  .  atorvastatin (LIPITOR) 80 MG tablet, Take 1 tablet (80 mg total) by mouth daily at 6 PM., Disp: 90 tablet, Rfl: 1 .  clopidogrel (PLAVIX) 75 MG tablet, Take 1 tablet (75 mg total) by mouth daily., Disp: 90 tablet, Rfl: 2 .  escitalopram (LEXAPRO) 20 MG tablet, Take 20 mg by mouth daily., Disp: , Rfl:  .  lisinopril (PRINIVIL,ZESTRIL) 2.5 MG tablet, Take 1 tablet (2.5 mg total) by mouth daily., Disp: 90 tablet, Rfl: 1 .  Menthol-Methyl Salicylate (BENGAY GREASELESS EX), Apply 1 application topically as needed (shoulder pain)., Disp: , Rfl:  .  metoprolol succinate (TOPROL XL) 25 MG 24 hr tablet, Take 1 tablet (25 mg total) by mouth daily., Disp: 90 tablet, Rfl: 3 .  multivitamin-iron-minerals-folic acid (CENTRUM) chewable tablet, Chew 1 tablet by mouth daily., Disp: , Rfl:   Past Medical History: Past Medical History:  Diagnosis Date  . CAD (coronary artery disease)    a. 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 Essential hypertension   . Hyperlipidemia   . Impaired fasting glucose   . Other fatigue   . Palpitations     Tobacco Use: Social History   Tobacco Use  Smoking Status Former Smoker  Smokeless Tobacco Never Used    Labs: Recent Clinical biochemist for [facility] Cardiac and Pulmonary Rehab Latest Ref Rng & Units 12/23/2017 12/23/2017 12/23/2017 12/23/2017 12/24/2017   Cholestrol 0 - 200 mg/dL - - - - -   LDLCALC 0 - 99 mg/dL - - - - -   HDL >40 mg/dL - - - - -   Trlycerides <150 mg/dL - - - - -   Hemoglobin A1c 4.8 - 5.6 % - - - - -   PHART 7.350 - 7.450 7.335(L) 7.279(L) 7.339(L) - -   PCO2ART 32.0 - 48.0 mmHg 40.8 39.2 39.6 - -   HCO3 20.0 - 28.0 mmol/L 22.0 18.1(L) 21.5 - -   TCO2 22 - 32 mmol/L 23 19(L) 23 20(L) 23   ACIDBASEDEF 0.0 - 2.0 mmol/L 4.0(H) 8.0(H) 4.0(H) - -   O2SAT % 99.0 99.0 99.0 - -      Capillary Blood Glucose: Lab Results  Component Value Date   GLUCAP 89 12/28/2017   GLUCAP 89 12/27/2017   GLUCAP 85 12/27/2017   GLUCAP 81 12/27/2017   GLUCAP 80 12/27/2017     Exercise Target Goals: Exercise Program Goal: Individual exercise prescription set using results from initial 6 min walk test and THRR while considering  patient's activity barriers and safety.   Exercise Prescription Goal: Starting with aerobic activity 30 plus minutes a day, 3 days per week for initial exercise prescription. Provide home exercise prescription and guidelines that  participant acknowledges understanding prior to discharge.  Activity Barriers & Risk Stratification: Activity Barriers & Cardiac Risk Stratification - 03/03/18 1016      Activity Barriers & Cardiac Risk Stratification   Activity Barriers  Other (comment)    Comments  L shoulder pain     Cardiac Risk Stratification  High       6 Minute Walk: 6 Minute Walk    Row Name 03/03/18 1015 05/30/18 1026       6 Minute Walk   Phase  Initial  Discharge    Distance  1450 feet  1600 feet    Distance % Change  -  10.3 %    Distance Feet Change  -  150 ft    Walk Time  6 minutes  6 minutes    # of Rest Breaks  0  0    MPH  2.75  3.03    METS  3.1  3.32    RPE  9  9    Perceived Dyspnea   7  6    VO2 Peak  12.03  13.18    Symptoms  No  No    Resting  HR  63 bpm  78 bpm    Resting BP  110/60  108/80    Resting Oxygen Saturation   98 %  96 %    Exercise Oxygen Saturation  during 6 min walk  98 %  96 %    Max Ex. HR  83 bpm  96 bpm    Max Ex. BP  122/62  130/60    2 Minute Post BP  114/60  112/60       Oxygen Initial Assessment:   Oxygen Re-Evaluation:   Oxygen Discharge (Final Oxygen Re-Evaluation):   Initial Exercise Prescription: Initial Exercise Prescription - 03/03/18 1000      Date of Initial Exercise RX and Referring Provider   Date  03/03/18    Referring Provider  [name]     Expected Discharge Date  06/03/18      Treadmill   MPH  1.4    Grade  0    Minutes  17    METs  2.07      NuStep   Level  1    SPM  108    Minutes  17    METs  2.4      Prescription Details   Frequency (times per week)  3    Duration  Progress to 30 minutes of continuous aerobic without signs/symptoms of physical distress      Intensity   THRR 40-80% of Max Heartrate  100-119-138    Ratings of Perceived Exertion  11-13    Perceived Dyspnea  0-4      Progression   Progression  Continue to progress workloads to maintain intensity without signs/symptoms of physical distress.      Resistance Training   Training Prescription  Yes    Weight  1    Reps  10-15       Perform Capillary Blood Glucose checks as needed.  Exercise Prescription Changes:  Exercise Prescription Changes    Row Name 03/03/18 1000 03/14/18 1000 03/27/18 1400 04/11/18 1200 04/25/18 1300     Response to Exercise   Blood Pressure (Admit)  -  114/68  106/62  120/72  104/60   Blood Pressure (Exercise)  -  144/60  158/78  150/72  162/90   Blood Pressure (Exit)  -  112/60  98/62  98/58  98/64   Heart Rate (Admit)  -  51 bpm  60 bpm  60 bpm  60 bpm   Heart Rate (Exercise)  -  105 bpm  98 bpm  112 bpm  118 bpm   Heart Rate (Exit)  -  60 bpm  73 bpm  78 bpm  82 bpm   Rating of Perceived Exertion (Exercise)  -  '11  11  11  11   ' Comments  -  First full week  of exercise!   Increase in overall MET level   -  increase in overall MET level    Duration  -  Progress to 30 minutes of  aerobic without signs/symptoms of physical distress  Progress to 30 minutes of  aerobic without signs/symptoms of physical distress  Continue with 30 min of aerobic exercise without signs/symptoms of physical distress.  Continue with 30 min of aerobic exercise without signs/symptoms of physical distress.   Intensity  -  THRR New 100-119-138  THRR unchanged  THRR unchanged  THRR unchanged     Progression   Progression  -  Continue to progress workloads to maintain intensity without signs/symptoms of physical distress.  Continue to progress workloads to maintain intensity without signs/symptoms of physical distress.  Continue to progress workloads to maintain intensity without signs/symptoms of physical distress.  Continue to progress workloads to maintain intensity without signs/symptoms of physical distress.   Average METs  -  2.45  3.9  4.8  5.15     Resistance Training   Training Prescription  -  Yes  Yes  Yes  Yes   Weight  -  '1  2  3  3   ' Reps  -  10-15  10-15  10-15  10-15     Treadmill   MPH  -  1.8  2.5  2.7  3   Grade  -  0  0  0  0   Minutes  -  '17  17  22  22   ' METs  -  2.3  2.9  3.1  3.29     NuStep   Level  -  1  - switched to XR   -  -   SPM  -  115  -  -  -   Minutes  -  17  -  -  -   METs  -  2.6  -  -  -     Recumbant Elliptical   Level  -  -  '1  2  3   ' RPM  -  -  64  89  84   Watts  -  -  92  116  133   Minutes  -  -  '22  17  17   ' METs  -  -  4.9  6.5  7     Home Exercise Plan   Plans to continue exercise at  Home (comment)  Home (comment)  Home (comment)  Home (comment)  Home (comment)   Frequency  Add 2 additional days to program exercise sessions.  Add 2 additional days to program exercise sessions.  Add 2 additional days to program exercise sessions.  Add 2 additional days to program exercise sessions.  Add 2 additional days to program  exercise sessions.   Initial Home Exercises Provided  03/03/18  03/03/18  03/03/18  03/03/18  03/03/18   Row Name 05/09/18 0800 05/25/18 1000  Response to Exercise   Blood Pressure (Admit)  106/62  130/70      Blood Pressure (Exercise)  140/62  158/70      Blood Pressure (Exit)  94/52  100/60      Heart Rate (Admit)  56 bpm  66 bpm      Heart Rate (Exercise)  113 bpm  125 bpm      Heart Rate (Exit)  82 bpm  88 bpm      Rating of Perceived Exertion (Exercise)  12  12      Comments  always comes to CR ready to work hard  3 more sessions       Duration  Continue with 30 min of aerobic exercise without signs/symptoms of physical distress.  Continue with 30 min of aerobic exercise without signs/symptoms of physical distress.      Intensity  THRR unchanged  THRR unchanged        Progression   Progression  Continue to progress workloads to maintain intensity without signs/symptoms of physical distress.  Continue to progress workloads to maintain intensity without signs/symptoms of physical distress.      Average METs  5.69  5.9        Resistance Training   Training Prescription  Yes  Yes      Weight  4  5      Reps  10-15  10-15        Treadmill   MPH  3.5  3.1      Grade  0  0.5      Minutes  22  22      METs  3.68  4        Recumbant Elliptical   Level  4  5      RPM  82  84      Watts  138  144      Minutes  17  17      METs  7.7  7.8        Home Exercise Plan   Plans to continue exercise at  Home (comment)  Home (comment)      Frequency  Add 2 additional days to program exercise sessions.  Add 2 additional days to program exercise sessions.      Initial Home Exercises Provided  03/03/18  03/03/18         Exercise Comments:  Exercise Comments    Row Name 03/27/18 1451 04/18/18 0829 05/15/18 1438 05/31/18 1418     Exercise Comments  Pt. has attended 10 sessions so far. He is eager to be here and increase his strength to get back to work. He has done well in  the program and is tolerating the exercise with ease.   Pt. works hard every session to push himself farther than before. He is willing and able to handle all increases with ease. We will continue to progress him as needed.   Pt. has attended 29 exercise sessions working harder than the one before every day. He has progressed with ease and states that he feels more comfortable working out outside of rehab. His last few sessions we will try him on the elliptical.    Pt. graduated today from  rehab with 36 sessions completed.  Details of the patient's exercise prescription and what He needs to do in order to continue the prescription and progress were discussed with patient.  Patient was given a copy of prescription and goals.  Patient verbalized understanding.  Garlon plans to continue to exercise by walking everyday and returning to work.       Exercise Goals and Review:  Exercise Goals    Row Name 03/03/18 1018             Exercise Goals   Increase Physical Activity  Yes       Intervention  Provide advice, education, support and counseling about physical activity/exercise needs.;Develop an individualized exercise prescription for aerobic and resistive training based on initial evaluation findings, risk stratification, comorbidities and participant's personal goals.       Expected Outcomes  Short Term: Attend rehab on a regular basis to increase amount of physical activity.;Long Term: Add in home exercise to make exercise part of routine and to increase amount of physical activity.;Long Term: Exercising regularly at least 3-5 days a week.       Increase Strength and Stamina  Yes       Intervention  Provide advice, education, support and counseling about physical activity/exercise needs.;Develop an individualized exercise prescription for aerobic and resistive training based on initial evaluation findings, risk stratification, comorbidities and participant's personal goals.       Expected Outcomes   Short Term: Increase workloads from initial exercise prescription for resistance, speed, and METs.       Able to understand and use rate of perceived exertion (RPE) scale  Yes       Intervention  Provide education and explanation on how to use RPE scale       Expected Outcomes  Short Term: Able to use RPE daily in rehab to express subjective intensity level;Long Term:  Able to use RPE to guide intensity level when exercising independently       Able to understand and use Dyspnea scale  Yes       Intervention  Provide education and explanation on how to use Dyspnea scale       Expected Outcomes  Short Term: Able to use Dyspnea scale daily in rehab to express subjective sense of shortness of breath during exertion;Long Term: Able to use Dyspnea scale to guide intensity level when exercising independently       Knowledge and understanding of Target Heart Rate Range (THRR)  Yes       Intervention  Provide education and explanation of THRR including how the numbers were predicted and where they are located for reference       Expected Outcomes  Short Term: Able to state/look up THRR;Long Term: Able to use THRR to govern intensity when exercising independently;Short Term: Able to use daily as guideline for intensity in rehab       Able to check pulse independently  Yes       Intervention  Provide education and demonstration on how to check pulse in carotid and radial arteries.;Review the importance of being able to check your own pulse for safety during independent exercise       Expected Outcomes  Short Term: Able to explain why pulse checking is important during independent exercise;Long Term: Able to check pulse independently and accurately       Understanding of Exercise Prescription  Yes       Intervention  Provide education, explanation, and written materials on patient's individual exercise prescription       Expected Outcomes  Short Term: Able to explain program exercise prescription;Long Term: Able  to explain home exercise prescription to exercise independently          Exercise Goals Re-Evaluation : Exercise  Goals Re-Evaluation    Row Name 03/27/18 1450 04/18/18 0826 05/15/18 1436         Exercise Goal Re-Evaluation   Exercise Goals Review  Increase Physical Activity;Increase Strength and Stamina;Able to understand and use rate of perceived exertion (RPE) scale;Knowledge and understanding of Target Heart Rate Range (THRR);Able to check pulse independently;Understanding of Exercise Prescription  Increase Physical Activity;Increase Strength and Stamina;Able to understand and use rate of perceived exertion (RPE) scale;Knowledge and understanding of Target Heart Rate Range (THRR);Able to check pulse independently;Understanding of Exercise Prescription  Increase Physical Activity;Increase Strength and Stamina;Able to understand and use rate of perceived exertion (RPE) scale;Knowledge and understanding of Target Heart Rate Range (THRR);Able to check pulse independently;Understanding of Exercise Prescription     Comments  Pt. has done excellent in the program so far. He has tolerated all exercise and increases well. We will continue to progress as we see fit.   Pt. continues to excel in the program. He works hard every session and pushes to increase his distance and overall MET level. He is up to 3.0MPH on the TM and reaching 6.9 METS on the XR.  Pt. has and continues to do great in the program. He makes it look so easy. He has worked up to 3.2 MPH and 2% grade on the TM with ease and always pushes himself on the XR averaging about 4 miles a day on there. He has progressed tremendously since starting CR and contiunes to build strength and stamina.      Expected Outcomes  Increase strength to get back to work.   increase stamina and strength.   increase stamina and strength.          Discharge Exercise Prescription (Final Exercise Prescription Changes): Exercise Prescription Changes - 05/25/18 1000       Response to Exercise   Blood Pressure (Admit)  130/70    Blood Pressure (Exercise)  158/70    Blood Pressure (Exit)  100/60    Heart Rate (Admit)  66 bpm    Heart Rate (Exercise)  125 bpm    Heart Rate (Exit)  88 bpm    Rating of Perceived Exertion (Exercise)  12    Comments  3 more sessions     Duration  Continue with 30 min of aerobic exercise without signs/symptoms of physical distress.    Intensity  THRR unchanged      Progression   Progression  Continue to progress workloads to maintain intensity without signs/symptoms of physical distress.    Average METs  5.9      Resistance Training   Training Prescription  Yes    Weight  5    Reps  10-15      Treadmill   MPH  3.1    Grade  0.5    Minutes  22    METs  4      Recumbant Elliptical   Level  5    RPM  84    Watts  144    Minutes  17    METs  7.8      Home Exercise Plan   Plans to continue exercise at  Home (comment)    Frequency  Add 2 additional days to program exercise sessions.    Initial Home Exercises Provided  03/03/18       Nutrition:  Target Goals: Understanding of nutrition guidelines, daily intake of sodium <1576m, cholesterol <2060m calories 30% from fat and 7% or less from saturated fats, daily  to have 5 or more servings of fruits and vegetables.  Biometrics: Pre Biometrics - 03/03/18 1018      Pre Biometrics   Height  '5\' 9"'  (1.753 m)    Weight  80.5 kg    Waist Circumference  37.5 inches    Hip Circumference  40 inches    Waist to Hip Ratio  0.94 %    BMI (Calculated)  26.2    Triceps Skinfold  6 mm    % Body Fat  21.9 %    Grip Strength  17.6 kg    Flexibility  12.6 in    Single Leg Stand  2.47 seconds      Post Biometrics - 05/30/18 1025       Post  Biometrics   Height  '5\' 9"'  (1.753 m)    Weight  82 kg    Waist Circumference  36.5 inches    Hip Circumference  39 inches    Waist to Hip Ratio  0.94 %    BMI (Calculated)  26.68    Triceps Skinfold  5 mm    % Body Fat   20.9 %    Grip Strength  18.4 kg    Flexibility  13 in    Single Leg Stand  31.25 seconds       Nutrition Therapy Plan and Nutrition Goals: Nutrition Therapy & Goals - 05/17/18 1253      Nutrition Therapy   RD appointment deferred  Yes      Personal Nutrition Goals   Comments  Patient was encouraged to attend the RD meeting tomorrow. Will continue to monitor for progress.       Intervention Plan   Intervention  Nutrition handout(s) given to patient.       Nutrition Assessments: Nutrition Assessments - 05/31/18 1601      MEDFICTS Scores   Pre Score  9    Post Score  14    Score Difference  5       Nutrition Goals Re-Evaluation:   Nutrition Goals Discharge (Final Nutrition Goals Re-Evaluation):   Psychosocial: Target Goals: Acknowledge presence or absence of significant depression and/or stress, maximize coping skills, provide positive support system. Participant is able to verbalize types and ability to use techniques and skills needed for reducing stress and depression.  Initial Review & Psychosocial Screening: Initial Psych Review & Screening - 03/03/18 1026      Family Dynamics   Good Support System?  Yes      Barriers   Psychosocial barriers to participate in program  There are no identifiable barriers or psychosocial needs.      Screening Interventions   Interventions  Encouraged to exercise       Quality of Life Scores: Quality of Life - 05/30/18 1028      Quality of Life   Select  Quality of Life      Quality of Life Scores   Health/Function Pre  26 %    Health/Function Post  29.2 %    Health/Function % Change  12.31 %    Socioeconomic Pre  30 %    Socioeconomic Post  30 %    Socioeconomic % Change   0 %    Psych/Spiritual Pre  30 %    Psych/Spiritual Post  30 %    Psych/Spiritual % Change  0 %    Family Pre  30 %    Family Post  30 %    Family % Change  0 %    GLOBAL Pre  28.29 %    GLOBAL Post  29.66 %    GLOBAL % Change  4.84 %       Scores of 19 and below usually indicate a poorer quality of life in these areas.  A difference of  2-3 points is a clinically meaningful difference.  A difference of 2-3 points in the total score of the Quality of Life Index has been associated with significant improvement in overall quality of life, self-image, physical symptoms, and general health in studies assessing change in quality of life.  PHQ-9: Recent Review Flowsheet Data    Depression screen [facility] 2/9 05/31/2018 03/03/2018   Decreased Interest 0 0   Down, Depressed, Hopeless 0 0   PHQ - 2 Score 0 0   Altered sleeping 1 0   Tired, decreased energy 1 1   Change in appetite 0 0   Feeling bad or failure about yourself  0 0   Trouble concentrating 0 0   Moving slowly or fidgety/restless 0 0   Suicidal thoughts 0 0   PHQ-9 Score 2 1   Difficult doing work/chores Not difficult at all Somewhat difficult     Interpretation of Total Score  Total Score Depression Severity:  1-4 = Minimal depression, 5-9 = Mild depression, 10-14 = Moderate depression, 15-19 = Moderately severe depression, 20-27 = Severe depression   Psychosocial Evaluation and Intervention: Psychosocial Evaluation - 05/31/18 1608      Discharge Psychosocial Assessment & Intervention   Comments  Patient has no psychosocial issues identified at [place]. His exit QOL score improved by 4.85% at 29.66% and his exit PHQ-9 socre went from 1 to 2.       Psychosocial Re-Evaluation: Psychosocial Re-Evaluation    [place] Name 03/27/18 1505 04/20/18 0750 05/17/18 1257         Psychosocial Re-Evaluation   Current issues with  None Identified  None Identified  None Identified     Comments  Patient's initial QOL score was 28.29 and his PHQ-9 1 with no psychosocial issues identified.   Patient's initial QOL score was 28.29 and his PHQ-9 1 with no psychosocial issues identified.   Patient's initial QOL score was 28.29 and his PHQ-9 1 with no psychosocial issues identified.       Expected Outcomes  Patient will have no psychosocial issues identified at discharge.   Patient will have no psychosocial issues identified at discharge.   Patient will have no psychosocial issues identified at discharge.      Interventions  Stress management education;Relaxation education;Encouraged to attend Cardiac Rehabilitation for the exercise  Stress management education;Relaxation education;Encouraged to attend Cardiac Rehabilitation for the exercise  Stress management education;Relaxation education;Encouraged to attend Cardiac Rehabilitation for the exercise     Continue Psychosocial Services   No Follow up required  No Follow up required  No Follow up required        Psychosocial Discharge (Final Psychosocial Re-Evaluation): Psychosocial Re-Evaluation - 05/17/18 1257      Psychosocial Re-Evaluation   Current issues with  None Identified    Comments  Patient's initial QOL score was 28.29 and his PHQ-9 1 with no psychosocial issues identified.     Expected Outcomes  Patient will have no psychosocial issues identified at discharge.     Interventions  Stress management education;Relaxation education;Encouraged to attend Cardiac Rehabilitation for the exercise    Continue Psychosocial Services   No Follow up required  Vocational Rehabilitation: Provide vocational rehab assistance to qualifying candidates.   Vocational Rehab Evaluation & Intervention: Vocational Rehab - 03/03/18 0956      Initial Vocational Rehab Evaluation & Intervention   Assessment shows need for Vocational Rehabilitation  No       Education: Education Goals: Education classes will be provided on a weekly basis, covering required topics. Participant will state understanding/return demonstration of topics presented.  Learning Barriers/Preferences: Learning Barriers/Preferences - 03/03/18 0954      Learning Barriers/Preferences   Learning Barriers  None    Learning Preferences  Skilled  Demonstration;Pictoral       Education Topics: Hypertension, Hypertension Reduction -Define heart disease and high blood pressure. Discus how high blood pressure affects the body and ways to reduce high blood pressure.   Exercise and Your Heart -Discuss why it is important to exercise, the FITT principles of exercise, normal and abnormal responses to exercise, and how to exercise safely.   CARDIAC REHAB PHASE II EXERCISE from 05/31/2018 in [place]  Date  05/24/18  Educator  Wynetta Emery  Instruction Review Code  2- Demonstrated Understanding      Angina -Discuss definition of angina, causes of angina, treatment of angina, and how to decrease risk of having angina.   CARDIAC REHAB PHASE II EXERCISE from 05/31/2018 in [place]  Date  05/31/18  Educator  Wynetta Emery  Instruction Review Code  2- Demonstrated Understanding      Cardiac Medications -Review what the following cardiac medications are used for, how they affect the body, and side effects that may occur when taking the medications.  Medications include Aspirin, Beta blockers, calcium channel blockers, ACE Inhibitors, angiotensin receptor blockers, diuretics, digoxin, and antihyperlipidemics.   CARDIAC REHAB PHASE II EXERCISE from 05/31/2018 in [place]  Date  03/08/18  Educator  Etheleen Mayhew  Instruction Review Code  2- Demonstrated Understanding      Congestive Heart Failure -Discuss the definition of CHF, how to live with CHF, the signs and symptoms of CHF, and how keep track of weight and sodium intake.   CARDIAC REHAB PHASE II EXERCISE from 05/31/2018 in [place]  Date  03/15/18  Educator  Wynetta Emery  Instruction Review Code  2- Demonstrated Understanding      Heart Disease and Intimacy -Discus the effect sexual activity has on the heart, how changes occur during intimacy as we age, and safety during sexual activity.   CARDIAC  REHAB PHASE II EXERCISE from 05/31/2018 in [place]  Date  03/22/18  Educator  Etheleen Mayhew  Instruction Review Code  2- Demonstrated Understanding      Smoking Cessation / COPD -Discuss different methods to quit smoking, the health benefits of quitting smoking, and the definition of COPD.   CARDIAC REHAB PHASE II EXERCISE from 05/31/2018 in [place]  Date  03/29/18  Educator  Wynetta Emery  Instruction Review Code  2- Demonstrated Understanding      Nutrition I: Fats -Discuss the types of cholesterol, what cholesterol does to the heart, and how cholesterol levels can be controlled.   Nutrition II: Labels -Discuss the different components of food labels and how to read food label   CARDIAC REHAB PHASE II EXERCISE from 05/31/2018 in [place]  Date  04/19/18  Educator  DC  Instruction Review Code  2- Demonstrated Understanding      Heart Parts/Heart Disease and PAD -Discuss the anatomy of the heart, the  pathway of blood circulation through the heart, and these are affected by heart disease.   CARDIAC REHAB PHASE II EXERCISE from 05/31/2018 in [place]  Date  04/14/18  Educator  Wynetta Emery  Instruction Review Code  2- Demonstrated Understanding      Stress I: Signs and Symptoms -Discuss the causes of stress, how stress may lead to anxiety and depression, and ways to limit stress.   CARDIAC REHAB PHASE II EXERCISE from 05/31/2018 in [place]  Date  04/26/18  Educator  DJ  Instruction Review Code  2- Demonstrated Understanding      Stress II: Relaxation -Discuss different types of relaxation techniques to limit stress.   CARDIAC REHAB PHASE II EXERCISE from 05/31/2018 in [place]  Date  05/03/18  Educator  DJ  Instruction Review Code  2- Demonstrated Understanding      Warning Signs of Stroke / TIA -Discuss definition of a stroke,  what the signs and symptoms are of a stroke, and how to identify when someone is having stroke.   CARDIAC REHAB PHASE II EXERCISE from 05/31/2018 in [place]  Date  05/10/18  Educator  DJ  Instruction Review Code  2- Demonstrated Understanding      Knowledge Questionnaire Score: Knowledge Questionnaire Score - 05/31/18 1601      Knowledge Questionnaire Score   Pre Score  19/24    Post Score  26/28       Core Components/Risk Factors/Patient Goals at Admission: Personal Goals and Risk Factors at Admission - 03/03/18 0956      Core Components/Risk Factors/Patient Goals on Admission    Weight Management  Weight Maintenance    Personal Goal Other  Yes    Personal Goal  Patient wants to get healthier and get back to work. He wants to increase his strength and stamina.     Intervention  Patient will attend CR 3 days/week and supplement with exercise 2 days/week.    Expected Outcomes  Patient will complete the program meeting his personal goals.        Core Components/Risk Factors/Patient Goals Review:  Goals and Risk Factor Review    Row Name 03/27/18 1503 04/20/18 0748 05/17/18 1254 05/31/18 1603       Core Components/Risk Factors/Patient Goals Review   Personal Goals Review  Other Get healthier; get back to work; increase strength and stamina.   Other Get healthier; get back to work; increase strength and stamina.   Other Get healthier; get back to work; increase strength and stamina.   Other Get healthier; get back to work; increase strength and stamina.     Review  Patient has completed 10 sessions gaining 4 lbs since his initial visit. He is doing well in the program with progression. He says he does feel stronger and has more endurance. He hopes to continue to meet his goals as he continues the program. Will continue to monitor for progress.   Patient has completed 17 sessions maintaining his weight since last 30 day review. He continues to do well in the  program with progression. He continues to say he feels better and feels stronger. He hopes to return to work in March 20 after seeing his cardiologist and completing the program. He already feels strong enough to work. He is pleased with his progress. Will continue to monitor.   Patient has completed 30 sessions 1.5 lbs since  last 30 day review. He continues to do well in  the program with progression. He continues to say he feels better and stronger and is ready to return to work after seeing his cardiologist. He says the program has helped him a lot. Will continue to monitor for progress.   Patient completed the program with 36 sessions gaining 3.6 lbs overall and losing inches in his waist and hip measurements. His exit measurements increased in grip strength, flexibility and balance. His exit walk test improved by 10.3%. He says he feels a lot stronger with increased stamina. He says he will see his MD in March to see if he can be released to return to work. He says he feels strong enough to work now. He says he is eating healthier but his medficts score did not improve. He says he is better able to manage stress now and he feels alot better overall. He plans to continue exercising by walking. CR will f/u for one year.      Expected Outcomes  Patient will continue to attend sessions and complete the program meeting his personal goals.   Patient will continue to attend sessions and complete the program meeting his personal goals.   Patient will continue to attend sessions and complete the program meeting his personal goals.   Patient will continue exercising by walking and continue to meet his personal goals.        Core Components/Risk Factors/Patient Goals at Discharge (Final Review):  Goals and Risk Factor Review - 05/31/18 1603      Core Components/Risk Factors/Patient Goals Review   Personal Goals Review  Other   Get healthier; get back to work; increase strength and stamina.    Review  Patient  completed the program with 36 sessions gaining 3.6 lbs overall and losing inches in his waist and hip measurements. His exit measurements increased in grip strength, flexibility and balance. His exit walk test improved by 10.3%. He says he feels a lot stronger with increased stamina. He says he will see his MD in March to see if he can be released to return to work. He says he feels strong enough to work now. He says he is eating healthier but his medficts score did not improve. He says he is better able to manage stress now and he feels alot better overall. He plans to continue exercising by walking. CR will f/u for one year.      Expected Outcomes  Patient will continue exercising by walking and continue to meet his personal goals.        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 He has a h/o hyperlipidemia and is a former smoker. He has no barriers to CR identified at orientation.   Patient is new to program. He has completed 3 sessions. Will continue to monitor for progress.          Comments: Patient graduated from [place] today on 05/31/2018 after completing 36 sessions. He achieved LTG of 30 minutes of aerobic exercise at Max Met level of 7.9. All patients vitals are WNL. Patient has met with dietician. Discharge instruction has been reviewed in detail and patient stated an understanding of material given. Patient plans to exercise at home by walking. Cardiac Rehab staff will make f/u calls at 1 month, 6 months, and 1 year. Patient had no complaints of  any abnormal S/S or pain on their exit visit.

## 2018-06-02 ENCOUNTER — Encounter (HOSPITAL_COMMUNITY): Payer: 59

## 2018-06-05 ENCOUNTER — Encounter (HOSPITAL_COMMUNITY)
Admission: RE | Admit: 2018-06-05 | Payer: 59 | Source: Ambulatory Visit | Attending: Cardiovascular Disease | Admitting: Cardiovascular Disease

## 2018-06-06 DIAGNOSIS — I10 Essential (primary) hypertension: Secondary | ICD-10-CM | POA: Diagnosis not present

## 2018-06-06 DIAGNOSIS — E782 Mixed hyperlipidemia: Secondary | ICD-10-CM | POA: Diagnosis not present

## 2018-06-06 DIAGNOSIS — I2510 Atherosclerotic heart disease of native coronary artery without angina pectoris: Secondary | ICD-10-CM | POA: Diagnosis not present

## 2018-06-07 ENCOUNTER — Encounter (HOSPITAL_COMMUNITY): Payer: 59

## 2018-06-09 ENCOUNTER — Encounter (HOSPITAL_COMMUNITY): Payer: 59

## 2018-06-12 ENCOUNTER — Encounter (HOSPITAL_COMMUNITY): Payer: 59

## 2018-07-05 ENCOUNTER — Ambulatory Visit: Payer: 59 | Admitting: Cardiothoracic Surgery

## 2018-07-05 ENCOUNTER — Telehealth (INDEPENDENT_AMBULATORY_CARE_PROVIDER_SITE_OTHER): Payer: 59 | Admitting: Cardiothoracic Surgery

## 2018-07-05 DIAGNOSIS — I2510 Atherosclerotic heart disease of native coronary artery without angina pectoris: Secondary | ICD-10-CM | POA: Diagnosis not present

## 2018-07-05 NOTE — Telephone Encounter (Signed)
[address]       [name] [address]             [phone]     CARDIOTHORACIC SURGERY TELEPHONE VIRTUAL OFFICE NOTE  Referring Provider is No ref. provider found Primary Cardiologist is Glenn Docker, MD PCP is Glenn Stabile, MD   HPI:  I spoke with Glenn [name] via telephone on 07/05/2018 at 10:15 AM and verified that I was speaking with the correct person.  I was at my office looking at his medical record during the call and he was at home.  The patient is 63 year old male who had emergency CABG x5 September 2019 for acute MI and V. fib arrest.  He had a prolonged hospital recovery and has since undergone outpatient cardiac rehab.  He still has class II symptoms of heart failure consistent with his postop ejection fraction of 40%.  He has pain in his left shoulder picking up anything more than 10 pounds.  He has been compliant with his medications as prescribed by his cardiologist.  He denies any drainage or clicking in the sternal incision.  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  The patient is now almost 6 months postop and it is apparent that he will be permanently disabled from doing his previous job.   Current Outpatient Medications  Medication Sig Dispense Refill  . aspirin EC 81 MG tablet Take 81 mg by mouth daily.    Marland Kitchen atorvastatin (LIPITOR) 80 MG tablet Take 1 tablet (80 mg total) by mouth daily at 6 PM. 90 tablet 1  . clopidogrel (PLAVIX) 75 MG tablet Take 1 tablet (75 mg total) by mouth daily. 90 tablet 2  . escitalopram (LEXAPRO) 20 MG tablet Take 20 mg by mouth daily.    Marland Kitchen lisinopril (PRINIVIL,ZESTRIL) 2.5 MG tablet Take 1 tablet (2.5 mg total) by mouth daily. 90 tablet 1  . Menthol-Methyl Salicylate (BENGAY GREASELESS EX) Apply 1  application topically as needed (shoulder pain).    . metoprolol succinate (TOPROL XL) 25 MG 24 hr tablet Take 1 tablet (25 mg total) by mouth daily. 90 tablet 3  . multivitamin-iron-minerals-folic acid (CENTRUM) chewable tablet Chew 1 tablet by mouth daily.     No current facility-administered medications for this visit.      Diagnostic Tests:  None   Impression: Patient has recovered from his emergency CABG x3 almost 6 months ago.  He still has symptoms of exertional shortness of breath-heart failure related to his preoperative MI and permanent postop reduction in LV function.  He will need monitoring by his cardiologist and continued medications for heart failure.  The patient completed his cardiac rehab at [facility] and he is encouraged to continue a home program which involves 150 minutes of aerobic activity a week.  Plan:  Patient will be returning for a face-to-face visit in approximately 6 weeks to confirm that he is unable to resume his previous occupation.    I discussed limitations of evaluation and management via telephone.  The patient was advised to call back or seek an in-person evaluation if the patient's clinical condition changes in any significant manner.  I spent in excess of 9 minutes of non-face-to-face time during the conduct of this telephone virtual office consultation.  Level 1  [phone])  5-10 minutes Level 2  (99442)            11-20 minutes Level 3  (99443)            21-30 minutes   Glenn Sox MD 07/05/2018 10:15 AM

## 2018-08-03 ENCOUNTER — Telehealth: Payer: Self-pay | Admitting: Cardiovascular Disease

## 2018-08-03 NOTE — Telephone Encounter (Signed)
Virtual Visit Pre-Appointment Phone Call  "(Name), I am calling you today to discuss your upcoming appointment. We are currently trying to limit exposure to the virus that causes COVID-19 by seeing patients at home rather than in the office."  1. "What is the BEST phone number to call the day of the visit?" - include this in appointment notes  2. Do you have or have access to (through a family member/friend) a smartphone with video capability that we can use for your visit?" a. If yes - list this number in appt notes as cell (if different from BEST phone #) and list the appointment type as a VIDEO visit in appointment notes b. If no - list the appointment type as a PHONE visit in appointment notes  3. Confirm consent - "In the setting of the current Covid19 crisis, you are scheduled for a (phone or video) visit with your provider on (date) at (time).  Just as we do with many in-office visits, in order for you to participate in this visit, we must obtain consent.  If you'd like, I can send this to your mychart (if signed up) or email for you to review.  Otherwise, I can obtain your verbal consent now.  All virtual visits are billed to your insurance company just like a normal visit would be.  By agreeing to a virtual visit, we'd like you to understand that the technology does not allow for your provider to perform an examination, and thus may limit your provider's ability to fully assess your condition. If your provider identifies any concerns that need to be evaluated in person, we will make arrangements to do so.  Finally, though the technology is pretty good, we cannot assure that it will always work on either your or our end, and in the setting of a video visit, we may have to convert it to a phone-only visit.  In either situation, we cannot ensure that we have a secure connection.  Are you willing to proceed?" STAFF: Did the patient verbally acknowledge consent to telehealth visit? Document  YES/NO here: Yes  4. Advise patient to be prepared - "Two hours prior to your appointment, go ahead and check your blood pressure, pulse, oxygen saturation, and your weight (if you have the equipment to check those) and write them all down. When your visit starts, your provider will ask you for this information. If you have an Apple Watch or Kardia device, please plan to have heart rate information ready on the day of your appointment. Please have a pen and paper handy nearby the day of the visit as well."  5. Give patient instructions for MyChart download to smartphone OR [email or web address] as below if video visit (depending on what platform provider is using)  6. Inform patient they will receive a phone call 15 minutes prior to their appointment time (may be from unknown caller ID) so they should be prepared to answer    TELEPHONE CALL NOTE  Glenn [name] has been deemed a candidate for a follow-up tele-health visit to limit community exposure during the Covid-19 pandemic. I spoke with the patient via phone to ensure availability of phone/video source, confirm preferred email & phone number, and discuss instructions and expectations.  I reminded Glenn [name] to be prepared with any vital sign and/or heart rhythm information that could potentially be obtained via home monitoring, at the time of his visit. I reminded Glenn [name] to expect a phone call prior to  his visit.  Dyane Dustman 08/03/2018 3:27 PM

## 2018-08-10 ENCOUNTER — Encounter: Payer: Self-pay | Admitting: Cardiovascular Disease

## 2018-08-10 ENCOUNTER — Telehealth (INDEPENDENT_AMBULATORY_CARE_PROVIDER_SITE_OTHER): Payer: 59 | Admitting: Cardiovascular Disease

## 2018-08-10 VITALS — BP 105/70 | Ht 68.0 in | Wt 178.0 lb

## 2018-08-10 DIAGNOSIS — E785 Hyperlipidemia, unspecified: Secondary | ICD-10-CM

## 2018-08-10 DIAGNOSIS — I6523 Occlusion and stenosis of bilateral carotid arteries: Secondary | ICD-10-CM

## 2018-08-10 DIAGNOSIS — I25708 Atherosclerosis of coronary artery bypass graft(s), unspecified, with other forms of angina pectoris: Secondary | ICD-10-CM | POA: Diagnosis not present

## 2018-08-10 DIAGNOSIS — I255 Ischemic cardiomyopathy: Secondary | ICD-10-CM

## 2018-08-10 DIAGNOSIS — I10 Essential (primary) hypertension: Secondary | ICD-10-CM

## 2018-08-10 NOTE — Progress Notes (Signed)
Virtual Visit via Telephone Note   This visit type was conducted due to national recommendations for restrictions regarding the COVID-19 Pandemic (e.g. social distancing) in an effort to limit this patient's exposure and mitigate transmission in our community.  Due to his co-morbid illnesses, this patient is at least at moderate risk for complications without adequate follow up.  This format is felt to be most appropriate for this patient at this time.  The patient did not have access to video technology/had technical difficulties with video requiring transitioning to audio format only (telephone).  All issues noted in this document were discussed and addressed.  No physical exam could be performed with this format.  Please refer to the patient's chart for his  consent to telehealth for [facility].   Evaluation Performed:  Follow-up visit  Date:  08/10/2018   ID:  Glenn [name], Glenn [name] 1955/12/10, MRN [id number]  Patient Location: Home Provider Location: Home  PCP:  Benita Stabile, MD  Cardiologist:  Prentice Docker, MD  Electrophysiologist:  None   Chief Complaint:  CAD  History of Present Illness:    Glenn [name] is a 63 y.o. male with a h/o severe obstructive coronary artery disease for which he underwent underwent 5 vessel CABG on 12/23/2017.  He is doing well. He denies exertional chest pain and dyspnea. Has some chest soreness only when coughing. Denies palpitations and leg swelling.  Has been outdoors doing yardwork this morning.   The patient does not have symptoms concerning for COVID-19 infection (fever, chills, cough, or new shortness of breath).    Past Medical History:  Diagnosis Date  . CAD (coronary artery disease)    a. 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 Essential hypertension   . Hyperlipidemia   .  Impaired fasting glucose   . Other fatigue   . Palpitations    Past Surgical History:  Procedure Laterality Date  . CORONARY ARTERY BYPASS GRAFT N/A 12/23/2017   Procedure: CORONARY ARTERY BYPASS GRAFTING (CABG) x Five , using left internal mammary artery and right leg greater saphenous vein harvested endoscopically;  Surgeon: Kerin Perna, MD;  Location: [facility] OR;  Service: Open Heart Surgery;  Laterality: N/A;  . LEFT HEART CATH AND CORONARY ANGIOGRAPHY N/A 12/22/2017   Procedure: LEFT HEART CATH AND CORONARY ANGIOGRAPHY;  Surgeon: Yvonne Kendall, MD;  Location: [facility] INVASIVE CV LAB;  Service: Cardiovascular;  Laterality: N/A;  . SHOULDER ACROMIOPLASTY  07/01/2017  . TEE WITHOUT CARDIOVERSION N/A 12/23/2017   Procedure: TRANSESOPHAGEAL ECHOCARDIOGRAM (TEE);  Surgeon: Donata Clay, Theron Arista, MD;  Location: [facility] OR;  Service: Open Heart Surgery;  Laterality: N/A;     Current Meds  Medication Sig  . aspirin EC 81 MG tablet Take 81 mg by mouth daily.  Marland Kitchen atorvastatin (LIPITOR) 80 MG tablet Take 1 tablet (80 mg total) by mouth daily at 6 PM.  . clopidogrel (PLAVIX) 75 MG tablet Take 1 tablet (75 mg total) by mouth daily.  Marland Kitchen escitalopram (LEXAPRO) 20 MG tablet Take 20 mg by mouth daily.  Marland Kitchen lisinopril (PRINIVIL,ZESTRIL) 2.5 MG tablet Take 1 tablet (2.5 mg total) by mouth daily.  . Menthol-Methyl Salicylate (BENGAY GREASELESS EX) Apply 1 application topically as needed (shoulder pain).  . metoprolol succinate (TOPROL XL) 25 MG 24 hr tablet Take 1 tablet (25 mg total) by mouth daily.  . multivitamin-iron-minerals-folic  acid (CENTRUM) chewable tablet Chew 1 tablet by mouth daily.     Allergies:   Patient has no known allergies.   Social History   Tobacco Use  . Smoking status: Former Games developer  . Smokeless tobacco: Never Used  Substance Use Topics  . Alcohol use: Yes    Comment: on occasion  . Drug use: No     Family Hx: The patient's family history includes CAD (age of onset: 49) in his brother;  Cancer in his father; Diabetes in his mother; Heart disease in his brother and mother; Hypertension in his mother.  ROS:   Please see the history of present illness.      All other systems reviewed and are negative.   Prior CV studies:   The following studies were reviewed today:  Echocardiogram on 03/13/2018 showed mild to moderately reduced left ventricular systolic function, LVEF 40 to 08%, diffuse hypokinesis, regional wall variation, and mild mitral regurgitation.  Labs/Other Tests and Data Reviewed:    EKG:  No ECG reviewed.  Recent Labs: 12/23/2017: ALT 24 12/24/2017: Magnesium 2.0 12/27/2017: BUN 22; Creatinine, Ser 1.03; Hemoglobin 9.6; Platelets 131; Potassium 3.9; Sodium 137   Recent Lipid Panel Lab Results  Component Value Date/Time   CHOL 196 12/23/2017 05:20 AM   TRIG 60 12/23/2017 05:20 AM   HDL 48 12/23/2017 05:20 AM   CHOLHDL 4.1 12/23/2017 05:20 AM   LDLCALC 136 (H) 12/23/2017 05:20 AM    Wt Readings from Last 3 Encounters:  08/10/18 178 lb (80.7 kg)  05/30/18 180 lb 12.4 oz (82 kg)  03/29/18 179 lb (81.2 kg)     Objective:    Vital Signs:  BP 105/70   Ht 5\' 8"  (1.727 m)   Wt 178 lb (80.7 kg)   BMI 27.06 kg/m    VITAL SIGNS:  reviewed  ASSESSMENT & PLAN:    1.  Coronary artery disease: 5 vessel CABG on 12/23/2017 with LIMA-LAD, SVG-D1, Seq-RI-LCx, and SVG-PDA.  Symptomatically stable.  Continue aspirin 81 mg, Plavix (as recommended by CT surgery), and atorvastatin. Continue Toprol-XL 25 mg daily given LVEF 40 to 45%.  2.  Ischemic cardiomyopathy: LVEF 40 to 45% at the time of hospitalization and on repeat echocardiogram on 03/13/2018 as detailed above.  Currently on metoprolol succinate and lisinopril.  Soft blood pressures have limited advancement of medication titration in the past.  Blood pressure is normal today. No diuretic requirement.  3.  Hypertension: Blood pressure is normal. No changes to therpay.  4.  Hyperlipidemia: LDL 74 on  02/23/2018. Continue atorvastatin 80 mg.  5.  Carotid artery stenosis: Dopplers in 12/2017 showed 40-59% RICA stenosis and 1-39% LICA stenosis. Will need repeat doppler studies in 12/2018.  Continue statin.  COVID-19 Education: The signs and symptoms of COVID-19 were discussed with the patient and how to seek care for testing (follow up with PCP or arrange E-visit).  The importance of social distancing was discussed today.  Time:   Today, I have spent 15 minutes with the patient with telehealth technology discussing the above problems.     Medication Adjustments/Labs and Tests Ordered: Current medicines are reviewed at length with the patient today.  Concerns regarding medicines are outlined above.   Tests Ordered: No orders of the defined types were placed in this encounter.   Medication Changes: No orders of the defined types were placed in this encounter.   Disposition:  Follow up in 6 month(s)  Signed, Prentice Docker, MD  08/10/2018 3:51 PM  [facility]

## 2018-08-10 NOTE — Patient Instructions (Signed)

## 2018-08-15 ENCOUNTER — Other Ambulatory Visit: Payer: Self-pay

## 2018-08-16 ENCOUNTER — Ambulatory Visit: Payer: 59 | Admitting: Cardiothoracic Surgery

## 2018-08-16 ENCOUNTER — Encounter: Payer: Self-pay | Admitting: Cardiothoracic Surgery

## 2018-08-16 VITALS — BP 118/68 | HR 58 | Temp 97.9°F | Resp 16 | Ht 69.0 in | Wt 178.0 lb

## 2018-08-16 DIAGNOSIS — Z951 Presence of aortocoronary bypass graft: Secondary | ICD-10-CM | POA: Diagnosis not present

## 2018-08-16 NOTE — Progress Notes (Signed)
PCP is Benita Stabile, MD Referring Provider is Laqueta Linden, MD  Chief Complaint  Patient presents with  . Routine Post Op    6 wk f/u to discuss RTW/disability s/p EMERGENT CABG X 5.Glenn KitchenMarland Kitchen9/19/19    HPI: Final postop visit now almost 8 months after urgent CABG x5 after he presented with a MI and cardiac arrest.  At the time of the last visit which was performed over the phone because of the COVID restrictions the patient still had fatigue, soreness and pain with movement of his upper extremities and felt unable to return to work.  The past 2 months his exercise tolerance and overall strength have improved.  His soreness of his thorax has improved with time.  He denies any cardiac symptoms of angina or CHF.  I personally reviewed his last echocardiogram which showed his EF to be approximately 45%, mild dysfunction.  At this point the patient is feeling much better and is ready to return to work.  I have provided him a return to work note with some limitations with  lifting and crawling.   Past Medical History:  Diagnosis Date  . CAD (coronary artery disease)    a. 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 Essential hypertension   . Hyperlipidemia   . Impaired fasting glucose   . Other fatigue   . Palpitations     Past Surgical History:  Procedure Laterality Date  . CORONARY ARTERY BYPASS GRAFT N/A 12/23/2017   Procedure: CORONARY ARTERY BYPASS GRAFTING (CABG) x Five , using left internal mammary artery and right leg greater saphenous vein harvested endoscopically;  Surgeon: Kerin Perna, MD;  Location: [facility] OR;  Service: Open Heart Surgery;  Laterality: N/A;  . LEFT HEART CATH AND CORONARY ANGIOGRAPHY N/A 12/22/2017   Procedure: LEFT HEART CATH AND CORONARY ANGIOGRAPHY;  Surgeon: Yvonne Kendall, MD;  [facility] IN[facility];   Service: Cardiovascular;  Laterality: N/A;  . SHOULDER ACROMIOPLASTY  07/01/2017  . TEE WITHOUT CARDIOVERSION N/A 12/23/2017   Procedure: TRANSESOPHAGEAL ECHOCARDIOGRAM (TEE);  Surgeon: Donata Clay, Theron Arista, MD;  Location: [facility] OR;  Service: Open Heart Surgery;  Laterality: N/A;    Family History  Problem Relation Age of Onset  . Diabetes Mother   . Hypertension Mother   . Heart disease Mother   . Cancer Father   . Heart disease Brother   . CAD Brother 33    Social History Social History   Tobacco Use  . Smoking status: Former Games developer  . Smokeless tobacco: Never Used  Substance Use Topics  . Alcohol use: Yes    Comment: on occasion  . Drug use: No    Current Outpatient Medications  Medication Sig Dispense Refill  . aspirin EC 81 MG tablet Take 81 mg by mouth daily.    Glenn [name] atorvastatin (LIPITOR) 80 MG tablet Take 1 tablet (80 mg total) by mouth daily at 6 PM. 90 tablet 1  . clopidogrel (PLAVIX) 75 MG tablet Take 1 tablet (75 mg total) by mouth daily. 90 tablet 2  . escitalopram (LEXAPRO) 20 MG tablet Take 20 mg by mouth daily.    Glenn [name] lisinopril (PRINIVIL,ZESTRIL) 2.5 MG tablet Take 1 tablet (2.5 mg total) by mouth daily. 90 tablet 1  . Menthol-Methyl Salicylate (BENGAY GREASELESS EX) Apply 1 application topically as needed (shoulder pain).    . metoprolol  succinate (TOPROL XL) 25 MG 24 hr tablet Take 1 tablet (25 mg total) by mouth daily. 90 tablet 3  . multivitamin-iron-minerals-folic acid (CENTRUM) chewable tablet Chew 1 tablet by mouth daily.     No current facility-administered medications for this visit.     No Known Allergies  Review of Systems  Weight stable No fever No cough, nausea, myalgia, shortness of breath, fatigue or symptoms of COVID infection  BP 118/68 (BP Location: Right Arm, Patient Position: Sitting, Cuff Size: Normal)   Pulse (!) 58   Temp 97.9 F (36.6 C) (Skin)   Resp 16   Ht 5\' 9"  (1.753 m)   Wt 178 lb (80.7 kg)   SpO2 96% Comment: ON RA  BMI 26.29  kg/m  Physical Exam      Exam    General- alert and comfortable    Neck- no JVD, no cervical adenopathy palpable, no carotid bruit   Lungs- clear without rales, wheezes   Cor- regular rate and rhythm, no murmur , gallop   Abdomen- soft, non-tender   Extremities - warm, non-tender, minimal edema   Neuro- oriented, appropriate, no focal weakness   Diagnostic Tests: Most recent echocardiogram images personally reviewed showing ejection fraction of 45% following surgery with preop ejection fraction 25%  Impression: Patient has showed excellent recovery and is ready to return to work.  He should not lift heavy weights more than 30 pounds or do strenuous climbing crawling  Plan: Continue current medications.  Importance of of heart healthy lifestyle including exercise and diet reviewed with patient. Return as needed.  Mikey Bussing, MD [facility] [phone]

## 2018-08-21 ENCOUNTER — Other Ambulatory Visit: Payer: Self-pay | Admitting: Cardiovascular Disease

## 2018-09-05 ENCOUNTER — Other Ambulatory Visit: Payer: Self-pay | Admitting: Cardiovascular Disease

## 2019-03-06 ENCOUNTER — Other Ambulatory Visit: Payer: Self-pay | Admitting: Cardiovascular Disease

## 2019-03-20 ENCOUNTER — Other Ambulatory Visit: Payer: Self-pay | Admitting: Cardiovascular Disease

## 2019-03-21 ENCOUNTER — Encounter: Payer: Self-pay | Admitting: Cardiovascular Disease

## 2019-03-21 ENCOUNTER — Telehealth (INDEPENDENT_AMBULATORY_CARE_PROVIDER_SITE_OTHER): Payer: BC Managed Care – PPO | Admitting: Cardiovascular Disease

## 2019-03-21 VITALS — BP 121/75 | HR 60 | Ht 69.0 in | Wt 172.0 lb

## 2019-03-21 DIAGNOSIS — I255 Ischemic cardiomyopathy: Secondary | ICD-10-CM

## 2019-03-21 DIAGNOSIS — I25708 Atherosclerosis of coronary artery bypass graft(s), unspecified, with other forms of angina pectoris: Secondary | ICD-10-CM

## 2019-03-21 DIAGNOSIS — E785 Hyperlipidemia, unspecified: Secondary | ICD-10-CM

## 2019-03-21 DIAGNOSIS — I10 Essential (primary) hypertension: Secondary | ICD-10-CM

## 2019-03-21 DIAGNOSIS — I6523 Occlusion and stenosis of bilateral carotid arteries: Secondary | ICD-10-CM

## 2019-03-21 NOTE — Progress Notes (Signed)
Virtual Visit via Telephone Note   This visit type was conducted due to national recommendations for restrictions regarding the COVID-19 Pandemic (e.g. social distancing) in an effort to limit this patient's exposure and mitigate transmission in our community.  Due to his co-morbid illnesses, this patient is at least at moderate risk for complications without adequate follow up.  This format is felt to be most appropriate for this patient at this time.  The patient did not have access to video technology/had technical difficulties with video requiring transitioning to audio format only (telephone).  All issues noted in this document were discussed and addressed.  No physical exam could be performed with this format.  Please refer to the patient's chart for his  consent to telehealth for [facility].   Date:  03/21/2019   ID:  Glenn [name], DOB 08-Apr-1956, MRN [id number]  Patient Location: Home Provider Location: Office  PCP:  Benita Stabile, MD  Cardiologist:  Prentice Docker, MD  Electrophysiologist:  None   Evaluation Performed:  Follow-Up Visit  Chief Complaint:  CAD  History of Present Illness:    Glenn [name] is a 63 y.o. male with  a h/o severe obstructive coronary artery disease for which he underwent underwent 5 vessel CABG on 12/23/2017.  He doesn't lift more than 30 lbs. He is no longer working. He used to do HVAC work.  He denies exertional chest pain, palpitations, and leg swelling. If he coughs hard his chest wall hurts.  The patient does not have symptoms concerning for COVID-19 infection (fever, chills, or new shortness of breath).   He is applying for disability.   Past Medical History:  Diagnosis Date  . CAD (coronary artery disease)    a. 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 Essential hypertension    . Hyperlipidemia   . Impaired fasting glucose   . Other fatigue   . Palpitations    Past Surgical History:  Procedure Laterality Date  . CORONARY ARTERY BYPASS GRAFT N/A 12/23/2017   Procedure: CORONARY ARTERY BYPASS GRAFTING (CABG) x Five , using left internal mammary artery and right leg greater saphenous vein harvested endoscopically;  Surgeon: Kerin Perna, MD;  Location: [facility] OR;  Service: Open Heart Surgery;  Laterality: N/A;  . LEFT HEART CATH AND CORONARY ANGIOGRAPHY N/A 12/22/2017   Procedure: LEFT HEART CATH AND CORONARY ANGIOGRAPHY;  Surgeon: Yvonne Kendall, MD;  Location: [facility]VAS[facility] LAB;  Service: Cardiovascular;  Laterality: N/A;  . SHOULDER ACROMIOPLASTY  07/01/2017  . TEE WITHOUT CARDIOVERSION N/A 12/23/2017   Procedure: TRANSESOPHAGEAL ECHOCARDIOGRAM (TEE);  Surgeon: Donata Clay, Theron Arista, MD;  Location: [facility] OR;  Service: Open Heart Surgery;  Laterality: N/A;     Current Meds  Medication Sig  . aspirin EC 81 MG tablet Take 81 mg by mouth daily.  Marland Kitchen atorvastatin (LIPITOR) 80 MG tablet TAKE 1 TABLET BY MOUTH ONCE DAILY AT  6  PM  . clopidogrel (PLAVIX) 75 MG tablet Take 1 tablet (75 mg total) by mouth daily.  Marland Kitchen escitalopram (LEXAPRO) 20 MG tablet Take 20 mg by mouth daily.  Marland Kitchen lisinopril (ZESTRIL) 2.5 MG tablet TAKE 1 TABLET BY MOUTH EVERY DAY  . Menthol-Methyl Salicylate (BENGAY GREASELESS EX) Apply 1 application topically as needed (shoulder pain).  . metoprolol succinate (TOPROL XL) 25 MG 24 hr tablet Take 1 tablet (25  mg total) by mouth daily.  . multivitamin-iron-minerals-folic acid (CENTRUM) chewable tablet Chew 1 tablet by mouth daily.     Allergies:   Patient has no known allergies.   Social History   Tobacco Use  . Smoking status: Former Research scientist (life sciences)  . Smokeless tobacco: Never Used  Substance Use Topics  . Alcohol use: Yes    Comment: on occasion  . Drug use: No     Family Hx: The patient's family history includes CAD (age of onset: 81) in his brother; Cancer  in his father; Diabetes in his mother; Heart disease in his brother and mother; Hypertension in his mother.  ROS:   Please see the history of present illness.     All other systems reviewed and are negative.   Prior CV studies:   The following studies were reviewed today:  Echocardiogram on 03/13/2018 showed mild to moderately reduced left ventricular systolic function, LVEF 40 to 45%, diffuse hypokinesis, regional wall variation, and mild mitral regurgitation.  Labs/Other Tests and Data Reviewed:    EKG:  No ECG reviewed.  Recent Labs: No results found for requested labs within last 8760 hours.   Recent Lipid Panel Lab Results  Component Value Date/Time   CHOL 196 12/23/2017 05:20 AM   TRIG 60 12/23/2017 05:20 AM   HDL 48 12/23/2017 05:20 AM   CHOLHDL 4.1 12/23/2017 05:20 AM   LDLCALC 136 (H) 12/23/2017 05:20 AM    Wt Readings from Last 3 Encounters:  03/21/19 172 lb (78 kg)  08/16/18 178 lb (80.7 kg)  08/10/18 178 lb (80.7 kg)     Objective:    Vital Signs:  BP 121/75   Pulse 60   Ht 5\' 9"  (1.753 m)   Wt 172 lb (78 kg)   BMI 25.40 kg/m    VITAL SIGNS:  reviewed  ASSESSMENT & PLAN:    1. Coronary artery disease: 5 vessel CABG on 12/23/2017 with LIMA-LAD, SVG-D1, Seq-RI-LCx, and SVG-PDA.Symptomatically stable. Continue aspirin 81 mg and atorvastatin. I will stop Plavix. Continue Toprol-XL 25 mg daily given LVEF 40 to 45%.  2. Ischemic cardiomyopathy: LVEF 40 to 45% at the time of hospitalization and on repeat echocardiogram on 03/13/2018 as detailed above. Currently on metoprolol succinate and lisinopril. Soft blood pressures have limited advancement of medication titration in the past. Blood pressure is normal today. No diuretic requirement.  3. Hypertension: Blood pressure is normal. No changes to therpay.  4. Hyperlipidemia: Continue atorvastatin 80 mg.  5. Carotid artery stenosis: Dopplers in 12/2017 showed 40-59% RICA stenosis and 7-00% LICA  stenosis. I will repeat doppler studies.Continue statin.   COVID-19 Education: The signs and symptoms of COVID-19 were discussed with the patient and how to seek care for testing (follow up with PCP or arrange E-visit).  The importance of social distancing was discussed today.  Time:   Today, I have spent 10 minutes with the patient with telehealth technology discussing the above problems.     Medication Adjustments/Labs and Tests Ordered: Current medicines are reviewed at length with the patient today.  Concerns regarding medicines are outlined above.   Tests Ordered: No orders of the defined types were placed in this encounter.   Medication Changes: No orders of the defined types were placed in this encounter.   Follow Up:  Either In Person or Virtual in 1 year(s)  Signed, Kate Sable, MD  03/21/2019 12:27 PM    [place]

## 2019-03-21 NOTE — Addendum Note (Signed)
Addended by: Levonne Hubert on: 03/21/2019 12:50 PM   Modules accepted: Orders

## 2019-03-21 NOTE — Patient Instructions (Signed)
Medication Instructions:  Your physician has recommended you make the following change in your medication:  Stop Taking Plavix   *If you need a refill on your cardiac medications before your next appointment, please call your pharmacy*  Lab Work: NONE  If you have labs (blood work) drawn today and your tests are completely normal, you will receive your results only by: Marland Kitchen MyChart Message (if you have MyChart) OR . A paper copy in the mail If you have any lab test that is abnormal or we need to change your treatment, we will call you to review the results.  Testing/Procedures: Your physician has requested that you have a carotid duplex. This test is an ultrasound of the carotid arteries in your neck. It looks at blood flow through these arteries that supply the brain with blood. Allow one hour for this exam. There are no restrictions or special instructions.    Follow-Up: At [facility], you and your health needs are our priority.  As part of our continuing mission to provide you with exceptional heart care, we have created designated Provider Care Teams.  These Care Teams include your primary Cardiologist (physician) and Advanced Practice Providers (APPs -  Physician Assistants and Nurse Practitioners) who all work together to provide you with the care you need, when you need it.  Your next appointment:   1 year(s)  The format for your next appointment:   In Person  Provider:   Kate Sable, MD  Other Instructions Thank you for choosing [facility]!

## 2019-03-27 ENCOUNTER — Other Ambulatory Visit: Payer: Self-pay

## 2019-03-27 ENCOUNTER — Ambulatory Visit (HOSPITAL_COMMUNITY)
Admission: RE | Admit: 2019-03-27 | Discharge: 2019-03-27 | Disposition: A | Discharge status: Home / Self Care | Payer: BC Managed Care – PPO | Source: Ambulatory Visit | Attending: Cardiovascular Disease | Admitting: Cardiovascular Disease

## 2019-03-27 ENCOUNTER — Other Ambulatory Visit: Payer: Self-pay | Admitting: Cardiovascular Disease

## 2019-03-27 DIAGNOSIS — I6523 Occlusion and stenosis of bilateral carotid arteries: Secondary | ICD-10-CM | POA: Diagnosis not present

## 2019-04-24 ENCOUNTER — Other Ambulatory Visit: Payer: Self-pay | Admitting: Cardiovascular Disease

## 2019-05-15 ENCOUNTER — Other Ambulatory Visit: Payer: Self-pay | Admitting: Cardiovascular Disease

## 2019-05-29 ENCOUNTER — Other Ambulatory Visit: Payer: Self-pay | Admitting: Cardiovascular Disease

## 2019-08-09 DIAGNOSIS — Z736 Limitation of activities due to disability: Secondary | ICD-10-CM

## 2019-12-12 ENCOUNTER — Ambulatory Visit: Payer: BC Managed Care – PPO | Admitting: Cardiology

## 2019-12-12 ENCOUNTER — Encounter: Payer: Self-pay | Admitting: Cardiology

## 2019-12-12 ENCOUNTER — Other Ambulatory Visit: Payer: Self-pay

## 2019-12-12 VITALS — BP 124/62 | HR 64 | Ht 69.0 in | Wt 170.0 lb

## 2019-12-12 DIAGNOSIS — I25119 Atherosclerotic heart disease of native coronary artery with unspecified angina pectoris: Secondary | ICD-10-CM

## 2019-12-12 DIAGNOSIS — I255 Ischemic cardiomyopathy: Secondary | ICD-10-CM

## 2019-12-12 DIAGNOSIS — E782 Mixed hyperlipidemia: Secondary | ICD-10-CM | POA: Diagnosis not present

## 2019-12-12 MED ORDER — NITROGLYCERIN 0.4 MG SL SUBL: 0.4000 mg | SUBLINGUAL_TABLET | SUBLINGUAL | 3 refills | Status: DC | PRN

## 2019-12-12 NOTE — Progress Notes (Signed)
Cardiology Office Note  Date: 12/12/2019   ID: Glenn [name], DOB May 04, 1955, MRN [id number]  PCP:  Benita Stabile, MD  Cardiologist:  Nona Dell, MD Electrophysiologist:  None   Chief Complaint  Patient presents with  . Cardiac follow-up    History of Present Illness: Glenn [name] is a 63 y.o. male former patient of Dr. Purvis Sheffield now presenting to establish follow-up with me.  I reviewed his records and updated the chart.  He was last assessed via telehealth encounter in December 2020.  He comes back to the office per Dr. Margo Aye with concerns about bradycardia.  He is here today with his wife. He tells me that he was seen in Dr. Scharlene Gloss office yesterday, reportedly with heart rate in the 40s. He was taken off metoprolol 25 mg twice daily and also lisinopril 2.5 mg daily. His present cardiac regimen is outlined below. He does not describe any palpitations or frank syncope. States that he has had some mild lightheadedness at times, particular if he bends over. No angina symptoms or nitroglycerin use.  He is not working at this point, previously in [employer].   His recent LDL was 69, he has tolerated high-dose Lipitor. Last echocardiogram was in 2019 at which point LVEF was 40 to 45%.  I reviewed his recent lab work as noted below.  I personally reviewed his ECG today which shows sinus rhythm at 65 bpm with right bundle branch block, occasional PVCs, nonspecific ST-T changes.  Past Medical History:  Diagnosis Date  . CAD (coronary artery disease)    a. 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 Essential hypertension   . Hyperlipidemia   . Impaired fasting glucose   . Palpitations     Past Surgical History:  Procedure Laterality Date  . CORONARY ARTERY BYPASS GRAFT N/A 12/23/2017   Procedure: CORONARY ARTERY BYPASS  GRAFTING (CABG) x Five , using left internal mammary artery and right leg greater saphenous vein harvested endoscopically;  Surgeon: Kerin Perna, MD;  Location: [facility] OR;  Service: Open Heart Surgery;  Laterality: N/A;  . LEFT HEART CATH AND CORONARY ANGIOGRAPHY N/A 12/22/2017   Procedure: LEFT HEART CATH AND CORONARY ANGIOGRAPHY;  Surgeon: Yvonne Kendall, MD;  Location: [facility] INVAS[facility] LAB;  Service: Cardiovascular;  Laterality: N/A;  . SHOULDER ACROMIOPLASTY  07/01/2017  . TEE WITHOUT CARDIOVERSION N/A 12/23/2017   Procedure: TRANSESOPHAGEAL ECHOCARDIOGRAM (TEE);  Surgeon: Donata Clay, Theron Arista, MD;  Location: [facility] OR;  Service: Open Heart Surgery;  Laterality: N/A;    Current Outpatient Medications  Medication Sig Dispense Refill  . aspirin EC 81 MG tablet Take 81 mg by mouth daily.    Marland Kitchen atorvastatin (LIPITOR) 80 MG tablet TAKE 1 TABLET BY MOUTH ONCE DAILY AT  6  PM 90 tablet 3  . escitalopram (LEXAPRO) 20 MG tablet Take 20 mg by mouth daily.    Marland Kitchen losartan (COZAAR) 25 MG tablet Take 25 mg by mouth daily.    . Menthol-Methyl Salicylate (BENGAY GREASELESS EX) Apply 1 application topically as needed (shoulder pain).    . multivitamin-iron-minerals-folic acid (CENTRUM) chewable tablet Chew 1 tablet by mouth daily.    . nitroGLYCERIN (NITROSTAT) 0.4 MG SL tablet Place 1 tablet (0.4 mg total) under the tongue every 5 (five) minutes as needed for chest pain. 25 tablet 3   No current  facility-administered medications for this visit.   Allergies:  Patient has no known allergies.   ROS: No frank syncope.  Physical Exam: VS:  BP 124/62   Pulse 64   Ht 5\' 9"  (1.753 m)   Wt 170 lb (77.1 kg)   SpO2 98%   BMI 25.10 kg/m , BMI Body mass index is 25.1 kg/m.  Wt Readings from Last 3 Encounters:  12/12/19 170 lb (77.1 kg)  03/21/19 172 lb (78 kg)  08/16/18 178 lb (80.7 kg)    General: Patient appears comfortable at rest. HEENT: Conjunctiva and lids normal, wearing a mask. Neck: Supple, no elevated  JVP or carotid bruits, no thyromegaly. Lungs: Clear to auscultation, nonlabored breathing at rest. Cardiac: Regular rate and rhythm with occasional ectopy, no S3, 2/6 systolic murmur, no pericardial rub. Abdomen: Soft, nontender, bowel sounds present. Extremities: No pitting edema, distal pulses 2+.  ECG:  An ECG dated January 11, 2018 was personally reviewed today and demonstrated:  Sinus rhythm with IVCD and probable old inferolateral infarct pattern.  Recent Labwork:  August 2021: Hemoglobin 13.6, platelets 139, BUN 24, creatinine 1.02, potassium 4.7, AST 28, ALT 33, cholesterol 134, triglycerides 79, HDL 49, LDL 69, hemoglobin A1c 5.6%  Other Studies Reviewed Today:  Carotid Dopplers March 27, 2019: IMPRESSION: Minor carotid atherosclerosis. No hemodynamically significant ICA stenosis. Degree of narrowing less than 50% bilaterally by ultrasound criteria.  Patent antegrade vertebral flow bilaterally  Echocardiogram March 13, 2018: - Left ventricle: The cavity size was mildly dilated. Wall  thickness was at the upper limits of normal. Systolic function  was mildly to moderately reduced. The estimated ejection fraction  was in the range of 40% to 45%. Diffuse hypokinesis. There is  moderate hypokinesis of the basal-midinferior and inferoseptal  myocardium.  - Aortic valve: Mildly calcified annulus. Trileaflet.  - Mitral valve: There was mild regurgitation.  - Left atrium: The atrium was mildly dilated.  - Right atrium: Central venous pressure (est): 3 mm Hg.  - Atrial septum: No defect or patent foramen ovale was identified.  - Tricuspid valve: There was trivial regurgitation.  - Pulmonary arteries: PA peak pressure: 24 mm Hg (S).  - Pericardium, extracardiac: There was no pericardial effusion.   Assessment and Plan:  1. Multivessel CAD status post CABG in September 2019, LVEF 40 to 45% range by last assessment. He does not report any active angina, no  palpitations or syncope. Lopressor and lisinopril were stopped yesterday at PCP office with concerns about bradycardia and intermittently low blood pressures. Current cardiac regimen includes aspirin, Lipitor, and losartan. His heart rate is in the 60s to 70s today with occasional PVCs by ECG. We will obtain a follow-up echocardiogram to reassess LVEF. Might consider trying him on low-dose bisoprolol if he still has residual LV dysfunction.  2. Mixed hyperlipidemia, tolerating high-dose Lipitor. Recent LDL 69.  Medication Adjustments/Labs and Tests Ordered: Current medicines are reviewed at length with the patient today.  Concerns regarding medicines are outlined above.   Tests Ordered: Orders Placed This Encounter  Procedures  . EKG 12-Lead  . ECHOCARDIOGRAM COMPLETE    Medication Changes: Meds ordered this encounter  Medications  . nitroGLYCERIN (NITROSTAT) 0.4 MG SL tablet    Sig: Place 1 tablet (0.4 mg total) under the tongue every 5 (five) minutes as needed for chest pain.    Dispense:  25 tablet    Refill:  3    Disposition:  Follow up test results and determine disposition.  Signed, 10-15-1989  Diona Browner, MD, [facility] 12/12/2019 1:26 PM    [facility] [address] [place], [place] [address] Phone: [phone]; Fax: [phone]

## 2019-12-12 NOTE — Patient Instructions (Signed)
Medication Instructions:  Take nitroglycerin as directed, read attached instructions   *If you need a refill on your cardiac medications before your next appointment, please call your pharmacy*   Lab Work: None today If you have labs (blood work) drawn today and your tests are completely normal, you will receive your results only by: Marland Kitchen MyChart Message (if you have MyChart) OR . A paper copy in the mail If you have any lab test that is abnormal or we need to change your treatment, we will call you to review the results.   Testing/Procedures: Dellia Cloud    Follow-Up: At [facility], you and your health needs are our priority.  As part of our continuing mission to provide you with exceptional heart care, we have created designated Provider Care Teams.  These Care Teams include your primary Cardiologist (physician) and Advanced Practice Providers (APPs -  Physician Assistants and Nurse Practitioners) who all work together to provide you with the care you need, when you need it.  We recommend signing up for the patient portal called "MyChart".  Sign up information is provided on this After Visit Summary.  MyChart is used to connect with patients for Virtual Visits (Telemedicine).  Patients are able to view lab/test results, encounter notes, upcoming appointments, etc.  Non-urgent messages can be sent to your provider as well.   To learn more about what you can do with MyChart, go to [email or web address].    Your next appointment:  Follow up to be determined after your echocardiogram.We will call you with results    Nitroglycerin sublingual tablets What is this medicine? NITROGLYCERIN (nye troe GLI ser in) is a type of vasodilator. It relaxes blood vessels, increasing the blood and oxygen supply to your heart. This medicine is used to relieve chest pain caused by angina. It is also used to prevent chest pain before activities like climbing stairs, going outdoors in cold weather, or  sexual activity. This medicine may be used for other purposes; ask your health care provider or pharmacist if you have questions. COMMON BRAND NAME(S): Nitroquick, Nitrostat, Nitrotab What should I tell my health care provider before I take this medicine? They need to know if you have any of these conditions:  anemia  head injury, recent stroke, or bleeding in the brain  liver disease  previous heart attack  an unusual or allergic reaction to nitroglycerin, other medicines, foods, dyes, or preservatives  pregnant or trying to get pregnant  breast-feeding How should I use this medicine? Take this medicine by mouth as needed. At the first sign of an angina attack (chest pain or tightness) place one tablet under your tongue. You can also take this medicine 5 to 10 minutes before an event likely to produce chest pain. Follow the directions on the prescription label. Let the tablet dissolve under the tongue. Do not swallow whole. Replace the dose if you accidentally swallow it. It will help if your mouth is not dry. Saliva around the tablet will help it to dissolve more quickly. Do not eat or drink, smoke or chew tobacco while a tablet is dissolving. If you are not better within 5 minutes after taking ONE dose of nitroglycerin, call 9-1-1 immediately to seek emergency medical care. Do not take more than 3 nitroglycerin tablets over 15 minutes. If you take this medicine often to relieve symptoms of angina, your doctor or health care professional may provide you with different instructions to manage your symptoms. If symptoms do not go away after following  these instructions, it is important to call 9-1-1 immediately. Do not take more than 3 nitroglycerin tablets over 15 minutes. Talk to your pediatrician regarding the use of this medicine in children. Special care may be needed. Overdosage: If you think you have taken too much of this medicine contact a poison control center or emergency room at  once. NOTE: This medicine is only for you. Do not share this medicine with others. What if I miss a dose? This does not apply. This medicine is only used as needed. What may interact with this medicine? Do not take this medicine with any of the following medications:  certain migraine medicines like ergotamine and dihydroergotamine (DHE)  medicines used to treat erectile dysfunction like sildenafil, tadalafil, and vardenafil  riociguat This medicine may also interact with the following medications:  alteplase  aspirin  heparin  medicines for high blood pressure  medicines for mental depression  other medicines used to treat angina  phenothiazines like chlorpromazine, mesoridazine, prochlorperazine, thioridazine This list may not describe all possible interactions. Give your health care provider a list of all the medicines, herbs, non-prescription drugs, or dietary supplements you use. Also tell them if you smoke, drink alcohol, or use illegal drugs. Some items may interact with your medicine. What should I watch for while using this medicine? Tell your doctor or health care professional if you feel your medicine is no longer working. Keep this medicine with you at all times. Sit or lie down when you take your medicine to prevent falling if you feel dizzy or faint after using it. Try to remain calm. This will help you to feel better faster. If you feel dizzy, take several deep breaths and lie down with your feet propped up, or bend forward with your head resting between your knees. You may get drowsy or dizzy. Do not drive, use machinery, or do anything that needs mental alertness until you know how this drug affects you. Do not stand or sit up quickly, especially if you are an older patient. This reduces the risk of dizzy or fainting spells. Alcohol can make you more drowsy and dizzy. Avoid alcoholic drinks. Do not treat yourself for coughs, colds, or pain while you are taking this  medicine without asking your doctor or health care professional for advice. Some ingredients may increase your blood pressure. What side effects may I notice from receiving this medicine? Side effects that you should report to your doctor or health care professional as soon as possible:  blurred vision  dry mouth  skin rash  sweating  the feeling of extreme pressure in the head  unusually weak or tired Side effects that usually do not require medical attention (report to your doctor or health care professional if they continue or are bothersome):  flushing of the face or neck  headache  irregular heartbeat, palpitations  nausea, vomiting This list may not describe all possible side effects. Call your doctor for medical advice about side effects. You may report side effects to FDA at 1-800-FDA-1088. Where should I keep my medicine? Keep out of the reach of children. Store at room temperature between 20 and 25 degrees C (68 and 77 degrees F). Store in Retail buyer. Protect from light and moisture. Keep tightly closed. Throw away any unused medicine after the expiration date. NOTE: This sheet is a summary. It may not cover all possible information. If you have questions about this medicine, talk to your doctor, pharmacist, or health care provider.  2020  Elsevier/Gold Standard (2013-01-25 17:57:36)

## 2019-12-19 ENCOUNTER — Other Ambulatory Visit: Payer: Self-pay

## 2019-12-19 ENCOUNTER — Telehealth: Payer: Self-pay

## 2019-12-19 ENCOUNTER — Ambulatory Visit (HOSPITAL_COMMUNITY)
Admission: RE | Admit: 2019-12-19 | Discharge: 2019-12-19 | Disposition: A | Discharge status: Home / Self Care | Payer: BC Managed Care – PPO | Source: Ambulatory Visit | Attending: Cardiology | Admitting: Cardiology

## 2019-12-19 DIAGNOSIS — I255 Ischemic cardiomyopathy: Secondary | ICD-10-CM | POA: Diagnosis not present

## 2019-12-19 LAB — ECHOCARDIOGRAM COMPLETE
Area-P 1/2: 2.87 cm2
Calc EF: 42.5 %
S' Lateral: 4.48 cm
Single Plane A2C EF: 40 %
Single Plane A4C EF: 46.2 %

## 2019-12-19 MED ORDER — BISOPROLOL FUMARATE 5 MG PO TABS: 2.5000 mg | ORAL_TABLET | Freq: Every day | ORAL | 3 refills | Status: DC

## 2019-12-19 NOTE — Telephone Encounter (Signed)
Echo results given to patient.Bisoprolol 2.5 mg e-scribed to [facility]

## 2019-12-19 NOTE — Progress Notes (Signed)
*  PRELIMINARY RESULTS* Echocardiogram 2D Echocardiogram has been performed.  Stacey Drain 12/19/2019, 9:22 AM

## 2019-12-19 NOTE — Telephone Encounter (Signed)
-----   Message from Jonelle Sidle, MD sent at 12/19/2019 10:51 AM EDT ----- Results reviewed.  LVEF 45 to 50% range at this point.  See recent office note.  Would suggest that we start bisoprolol 2.5 mg daily.  He was taken off metoprolol most recently.

## 2020-02-18 IMAGING — DX DG CHEST 1V PORT
1 series · 1 of 1 positions shown · non-contrast
Comparison: Preoperative chest x-ray 12/22/2017

CLINICAL DATA: Status post bypass surgery.

EXAM:
PORTABLE CHEST 1 VIEW

[chest]
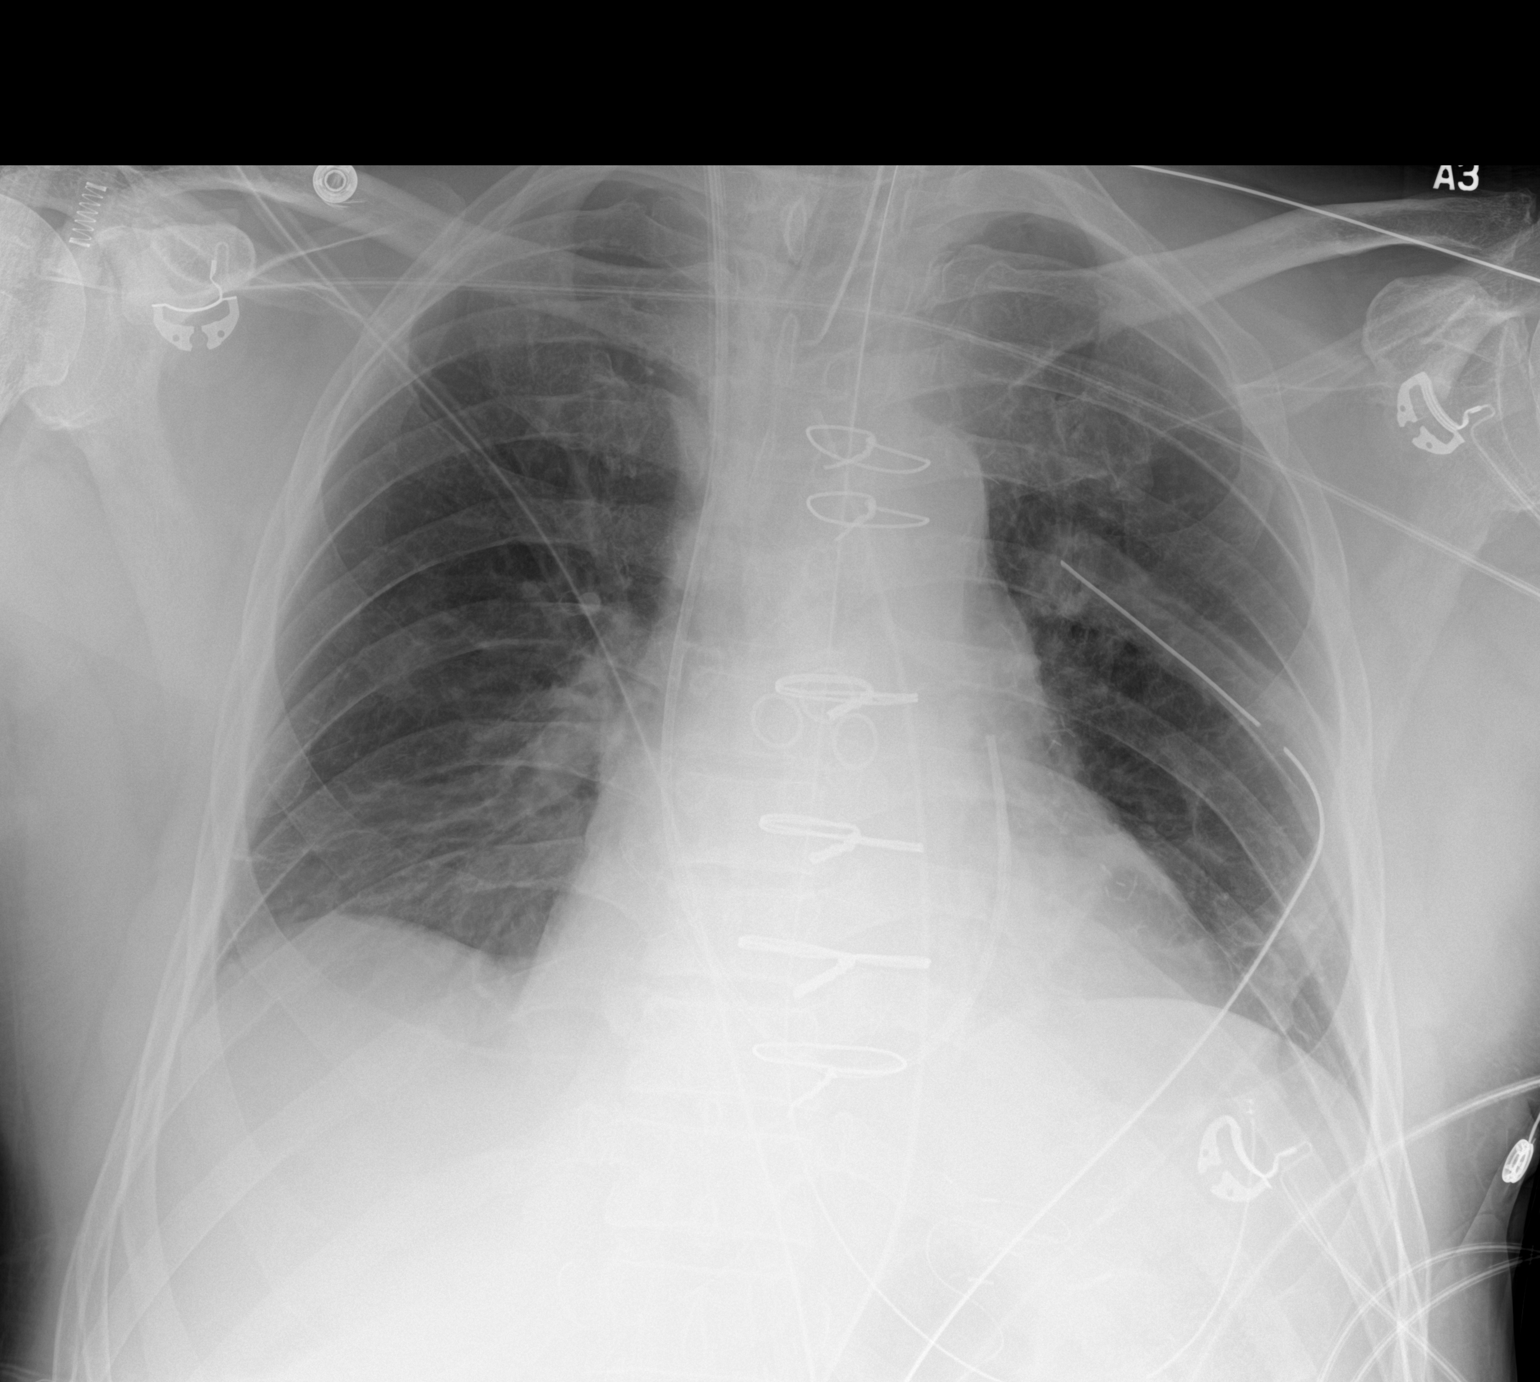

[1 of 1 positions shown; findings below may reference images not displayed]

FINDINGS: The endotracheal tube is 6 cm above the carina. The NG tube is
coursing down the esophagus and into the stomach. The right IJ
Swan-Ganz catheter tip is in the main pulmonary artery trunk.
Left-sided chest tube in good position without pneumothorax.
Mediastinal drain tubes are in place.

Streaky bibasilar atelectasis but no edema, definite effusions or
pulmonary edema.
IMPRESSION: 1. Postoperative support apparatus in good position without
complicating features.
2. Minimal streaky atelectasis but no edema, effusions or
pneumothorax.

## 2020-02-19 IMAGING — DX DG CHEST 1V PORT
1 series · 1 of 1 positions shown · non-contrast
Comparison: Yesterday

CLINICAL DATA: Status post CABG

EXAM:
PORTABLE CHEST 1 VIEW

[chest]
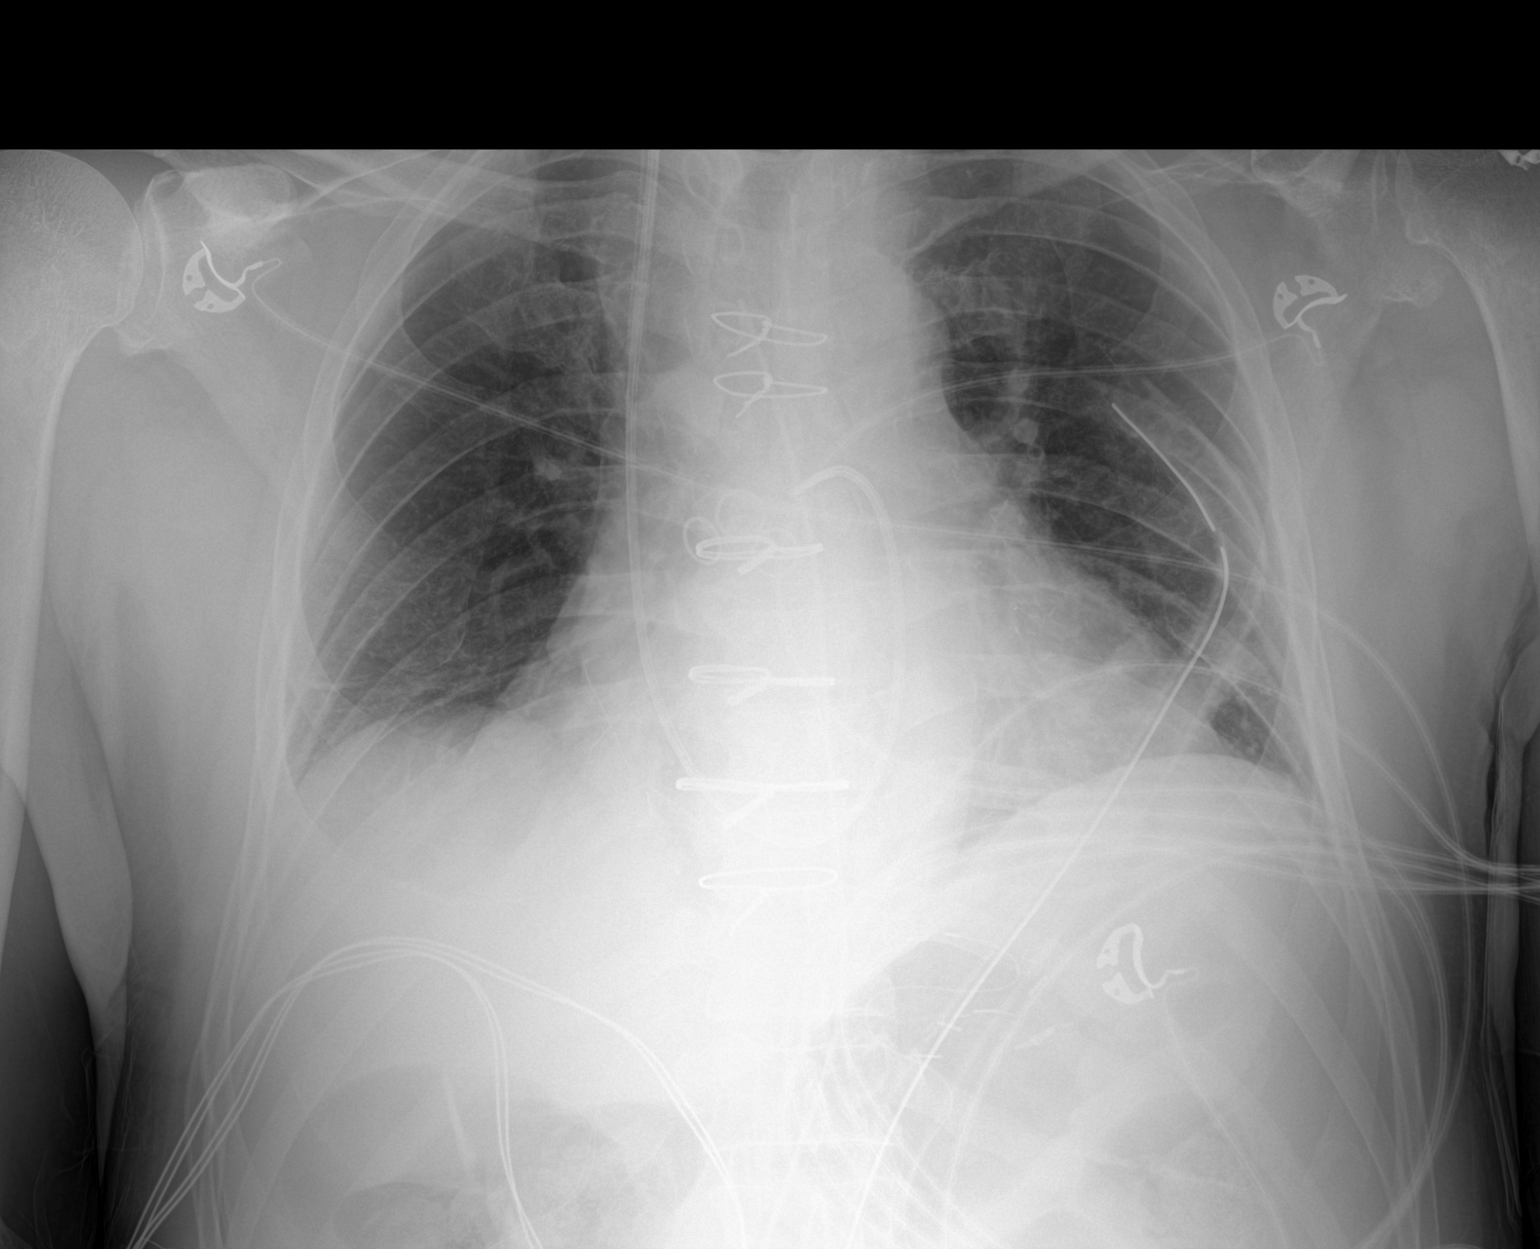

[1 of 1 positions shown; findings below may reference images not displayed]

FINDINGS: Extubation with lower volumes. Atelectasis remains mild. Thoracic
drains in place. Progression of Swan-Ganz catheter directed into the
right main pulmonary artery. Trace left apical pneumothorax. Stable
postoperative heart size.
IMPRESSION: 1. Lower volumes after extubation.  Atelectasis is mild.
2. Trace left apical pneumothorax.

## 2020-02-20 IMAGING — DX DG CHEST 1V PORT
1 series · 1 of 1 positions shown · non-contrast
Comparison: 12/24/2017

CLINICAL DATA: Status post coronary bypass grafting

EXAM:
PORTABLE CHEST 1 VIEW

[chest ap]
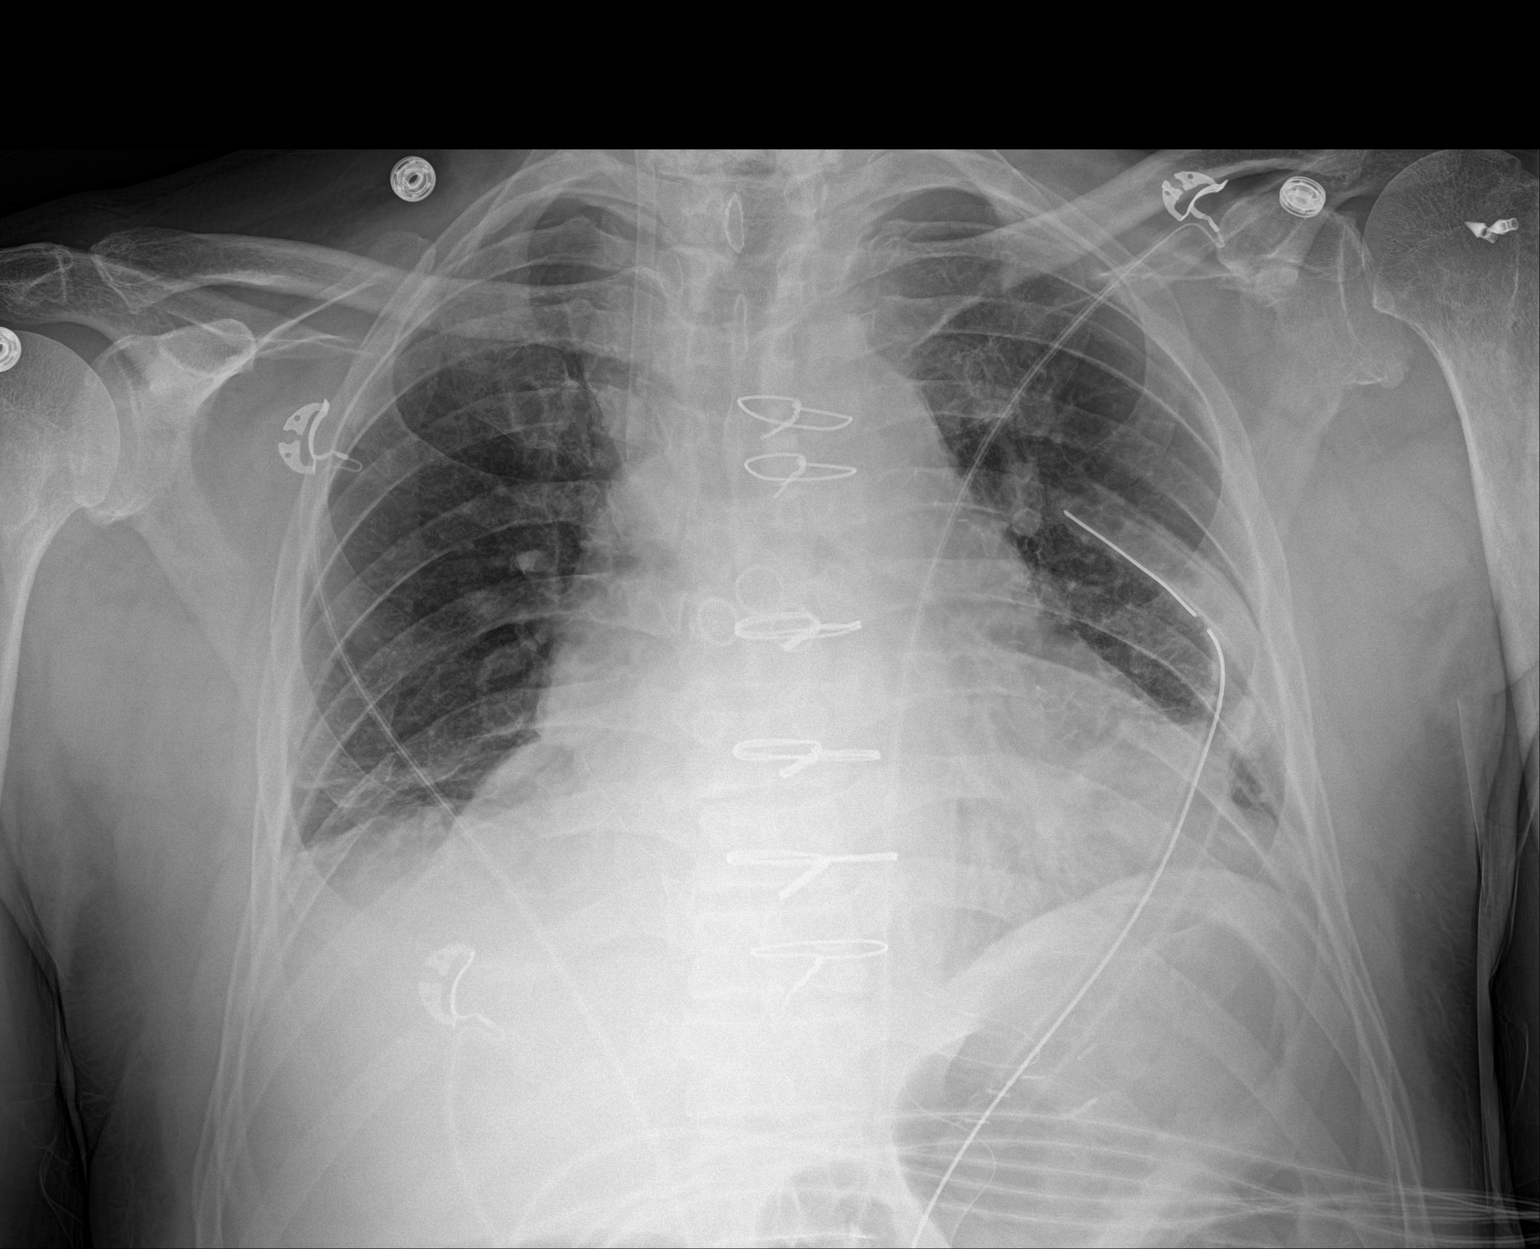

[1 of 1 positions shown; findings below may reference images not displayed]

FINDINGS: Cardiac shadow is enlarged but stable. Postsurgical changes are
again seen. Left thoracostomy catheter and right jugular sheath are
again seen. The Swan-Ganz catheter mediastinal drain have been
removed. Bibasilar atelectatic changes are again noted. A tiny left
apical pneumothorax is again seen and stable. No new focal
abnormality is noted.
IMPRESSION: Bibasilar atelectasis stable from the previous exam.

Stable left apical pneumothorax.

## 2020-02-21 IMAGING — DX DG CHEST 2V
2 series · 2 of 2 positions shown · non-contrast
Comparison: Chest x-ray the lungs.

CLINICAL DATA: Status post CABG chest x-ray

EXAM:
CHEST - 2 VIEW

[w chest lat]
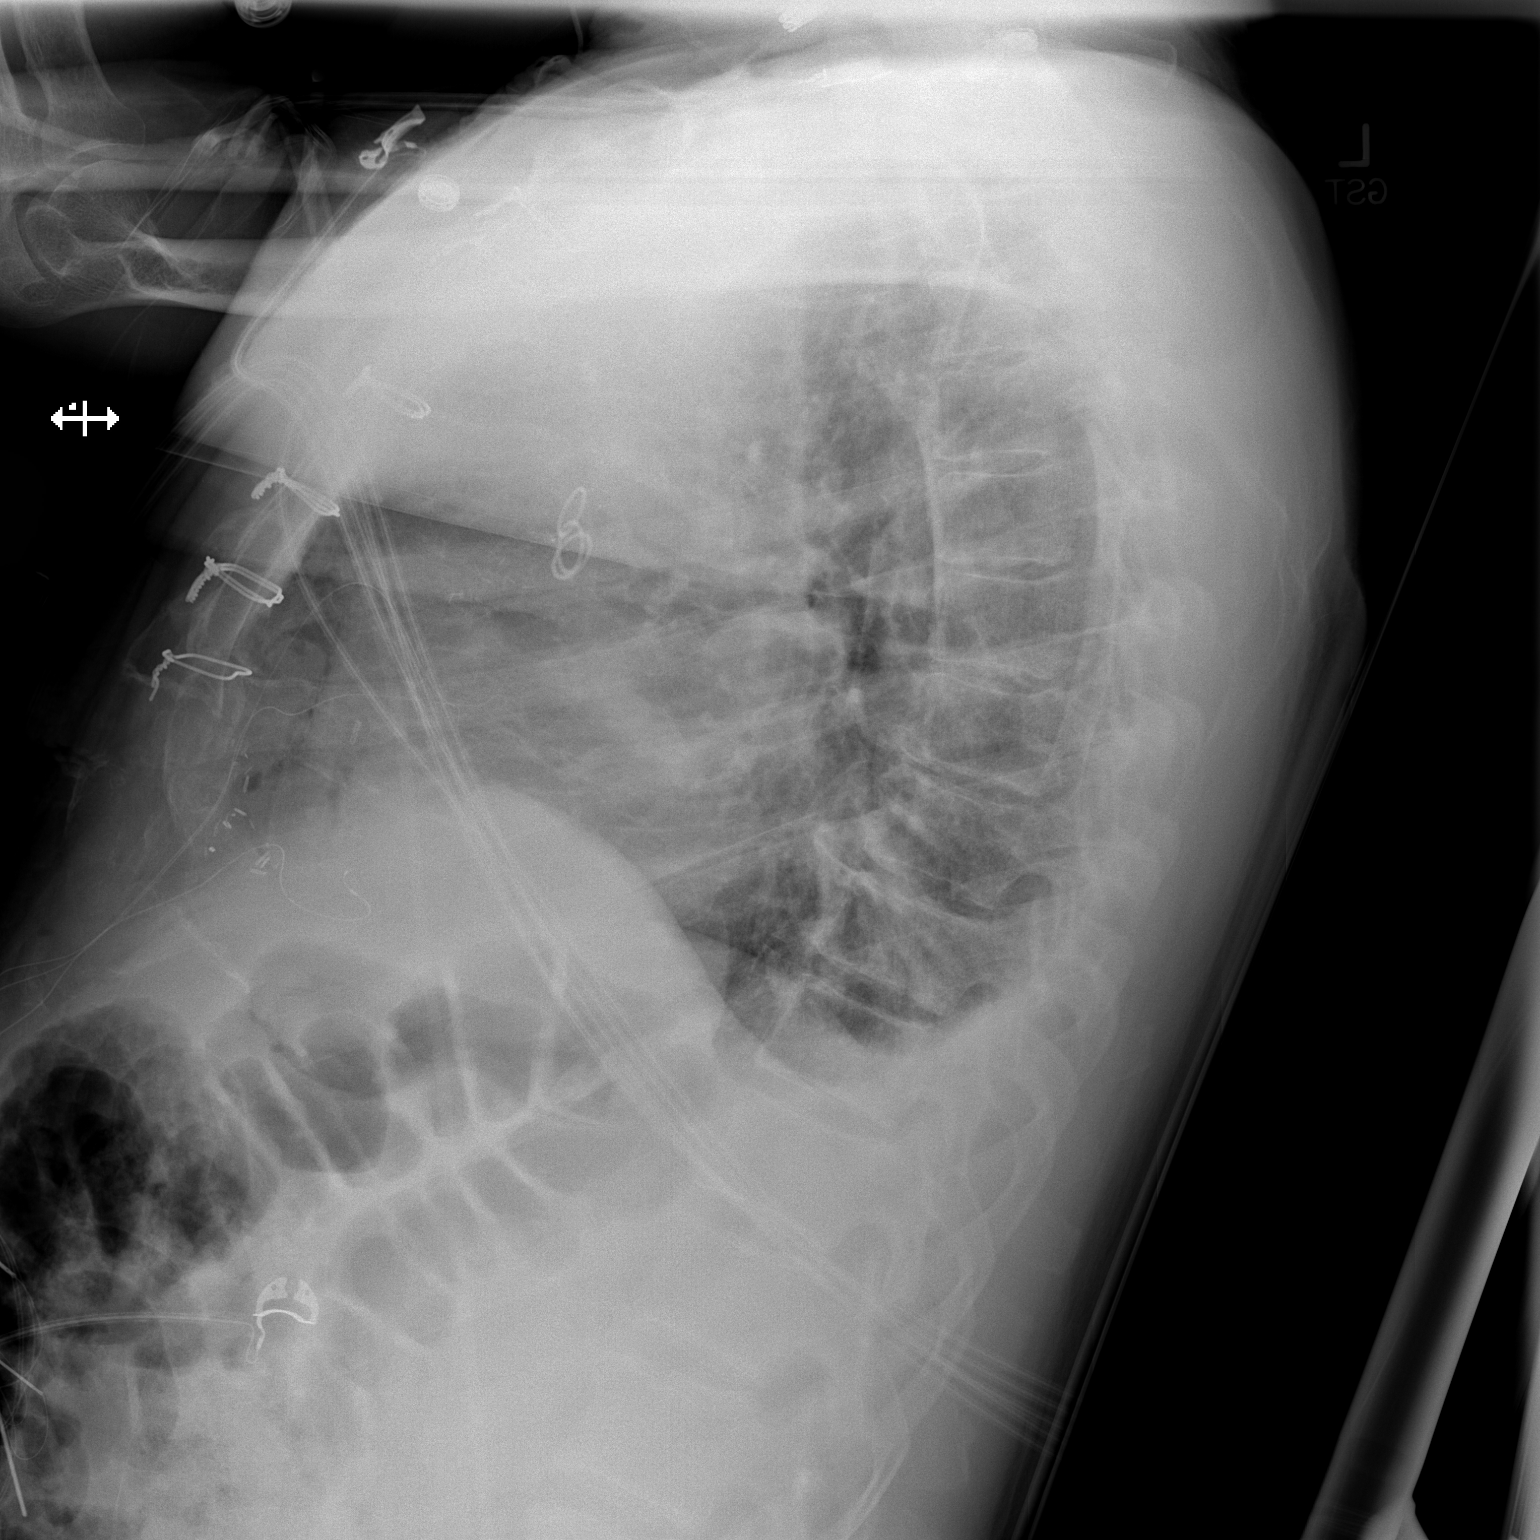

[w chest pa]
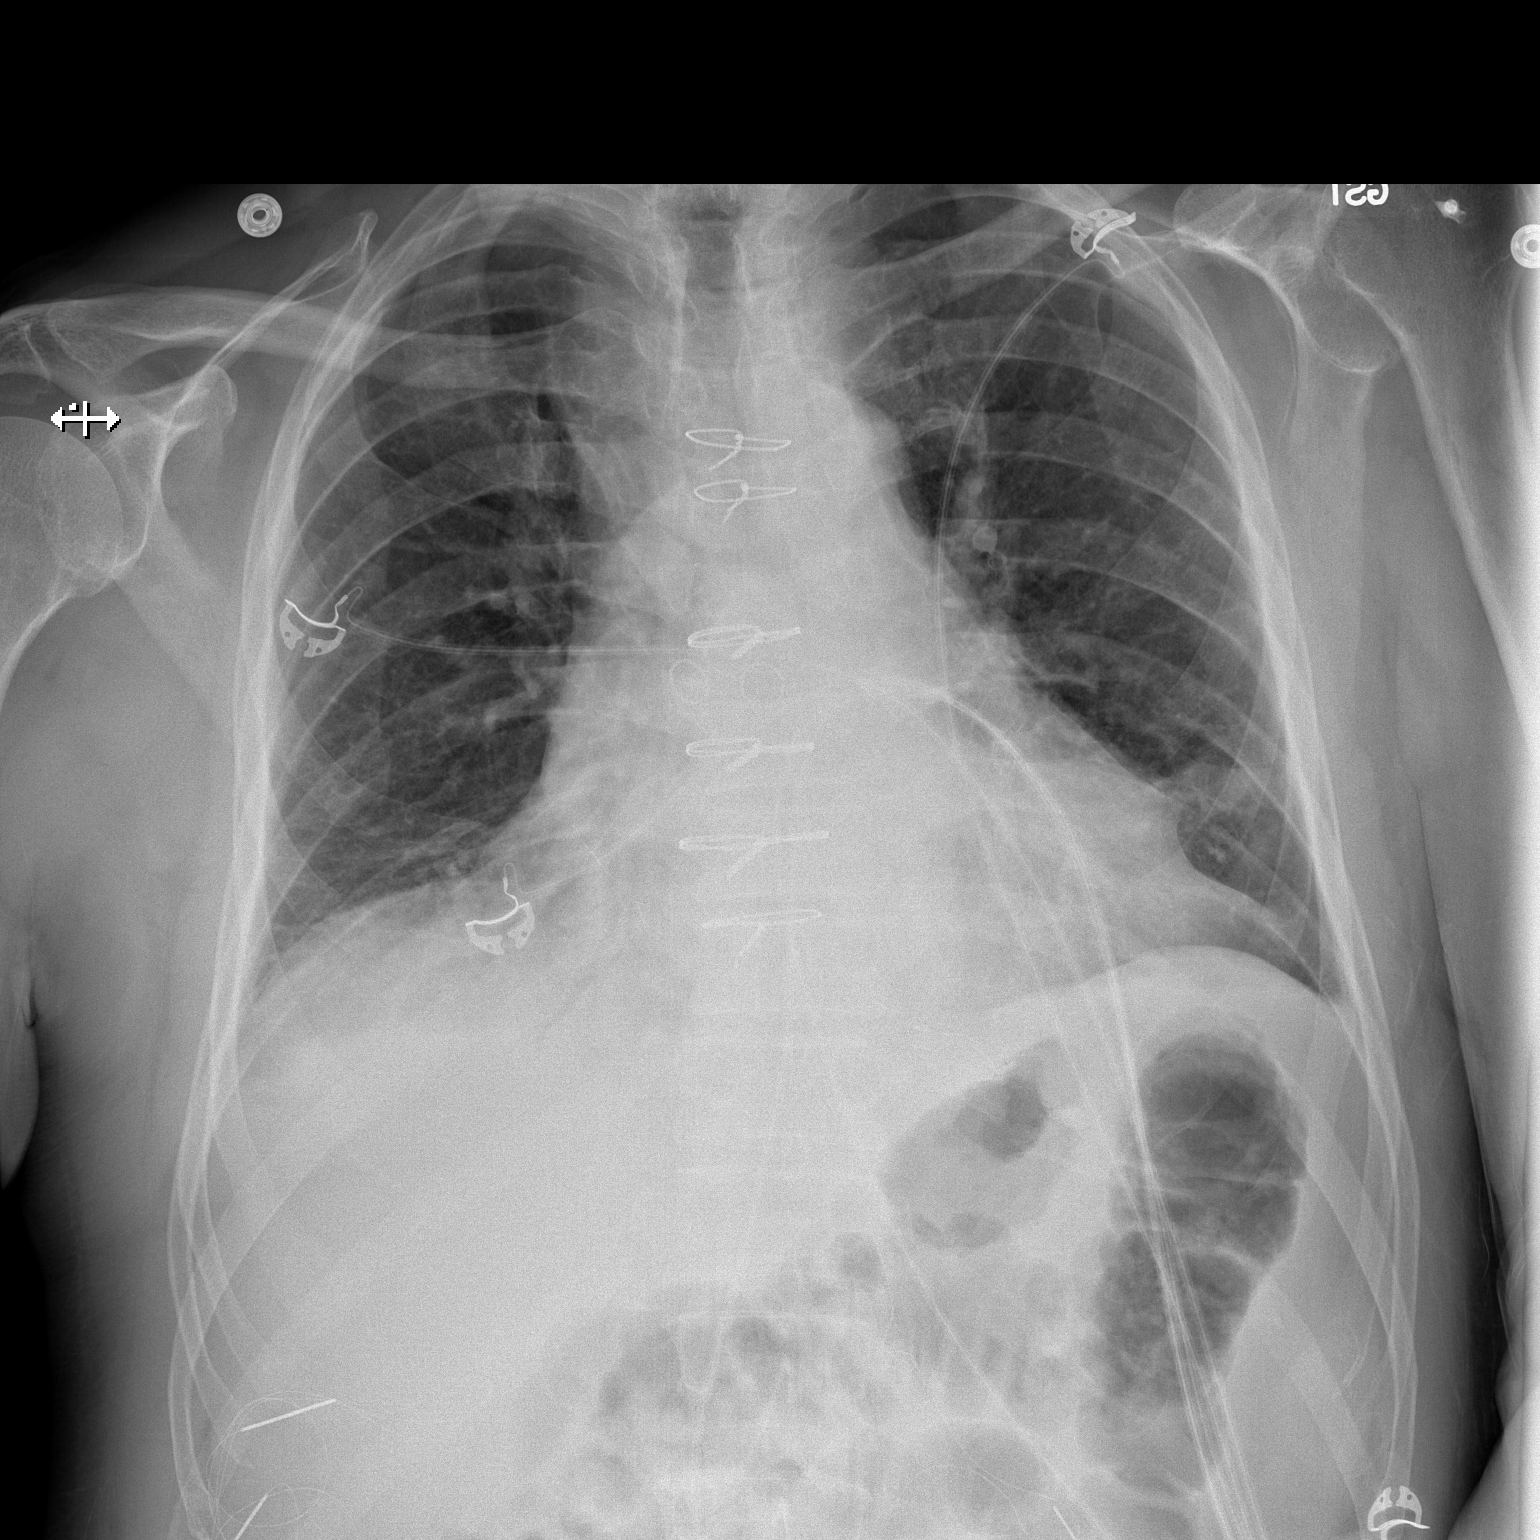

[2 of 2 positions shown; findings below may reference images not displayed]

FINDINGS: The lungs are adequately inflated. The interstitial markings remain
coarse. There is no pneumothorax nor significant pleural effusion.
The cardiac silhouette is enlarged. The pulmonary vascularity is
less engorged and more distinct. There has been interval removal of
the internal jugular Cordis sheath on the right and the left chest
tube.
IMPRESSION: Further interval improvement in the appearance of the chest.
Persistent mild pulmonary interstitial edema. Subsegmental
atelectasis at the left lung base. Trace bilateral pleural
effusions.

## 2020-03-11 ENCOUNTER — Other Ambulatory Visit: Payer: Self-pay | Admitting: *Deleted

## 2020-03-11 ENCOUNTER — Other Ambulatory Visit: Payer: Self-pay

## 2020-03-11 MED ORDER — ATORVASTATIN CALCIUM 80 MG PO TABS
ORAL_TABLET | ORAL | 3 refills | Status: DC
Start: 2020-03-11 — End: 2020-03-11

## 2020-03-11 MED ORDER — ATORVASTATIN CALCIUM 80 MG PO TABS: ORAL_TABLET | ORAL | 3 refills | Status: DC

## 2020-03-11 NOTE — Telephone Encounter (Signed)
Refilled lipitor 

## 2020-03-13 ENCOUNTER — Other Ambulatory Visit: Payer: Self-pay

## 2020-03-13 MED ORDER — ATORVASTATIN CALCIUM 80 MG PO TABS: ORAL_TABLET | ORAL | 3 refills | Status: DC

## 2020-03-13 NOTE — Telephone Encounter (Signed)
Refilled atorvastatin 80 mg #90 with RF:3 to [facility]

## 2020-03-20 ENCOUNTER — Ambulatory Visit: Payer: Medicare Other | Admitting: Student

## 2020-03-20 ENCOUNTER — Encounter: Payer: Self-pay | Admitting: Student

## 2020-03-20 ENCOUNTER — Other Ambulatory Visit: Payer: Self-pay

## 2020-03-20 VITALS — BP 134/68 | HR 56 | Ht 69.0 in | Wt 183.0 lb

## 2020-03-20 DIAGNOSIS — E785 Hyperlipidemia, unspecified: Secondary | ICD-10-CM

## 2020-03-20 DIAGNOSIS — I10 Essential (primary) hypertension: Secondary | ICD-10-CM | POA: Diagnosis not present

## 2020-03-20 DIAGNOSIS — I2510 Atherosclerotic heart disease of native coronary artery without angina pectoris: Secondary | ICD-10-CM | POA: Diagnosis not present

## 2020-03-20 DIAGNOSIS — I255 Ischemic cardiomyopathy: Secondary | ICD-10-CM

## 2020-03-20 NOTE — Progress Notes (Signed)
Cardiology Office Note    Date:  03/20/2020   ID:  Glenn [name], DOB Nov 18, 1955, MRN [id number]  PCP:  Benita Stabile, MD  Cardiologist: Nona Dell, MD    Chief Complaint  Patient presents with  . Follow-up    Annual Visit    History of Present Illness:    Glenn [name] is a 64 y.o. male with past medical history of CAD (s/p CABG in 12/2017 with LIMA-LAD, SVG-D1, Seq-RI-LCx, and SVG-PDA), chronic combined systolic and diastolic CHF/ICM (EF 40-45% in 12/2017), HTN, HLD and carotid artery stenosis who presents to the office today for 52-month follow-up.  He was last examined by Dr. Diona Browner in 12/2019 as he had recently been evaluated by his PCP and heart rate was in the 40's at the time of his visit and Lopressor and Lisinopril were discontinued at that time. He did report occasional dizziness but denied any chest pain or palpitations. A repeat echocardiogram was recommended for reassessment of his EF. This showed that his EF had improved to 45 to 50%. RV function was normal and he had mild MR. It was recommended to start Bisoprolol 2.5 mg daily given his cardiomyopathy and follow HR and BP with this.   In talking with the patient today, he reports overall doing well since his last visit. He denies any recent exertional chest pain or dyspnea on exertion. He does experience an occasional shooting pain along his precordium which lasts for a few seconds and spontaneously resolves. Says this has been occurring since CABG. He denies any specific orthopnea, PND or lower extremity edema. He does feel like his energy level improved with being switched from Lopressor to Bisoprolol. Reports pain along his knees bilaterally and is planning to see Orthopedics in the new year.   Past Medical History:  Diagnosis Date  . CAD (coronary artery disease)    a. 12/2017: VT Arrest during Treadmill Stress Test --> cath showing critical multivessel CAD including 90% LM stenosis -->  s/p CABGx5 on 12/23/2017  with LIMA-LAD, SVG-D1, Seq-RI-LCx, and SVG-PDA  . Essential hypertension   . Hyperlipidemia   . Impaired fasting glucose   . Palpitations     Past Surgical History:  Procedure Laterality Date  . CORONARY ARTERY BYPASS GRAFT N/A 12/23/2017   Procedure: CORONARY ARTERY BYPASS GRAFTING (CABG) x Five , using left internal mammary artery and right leg greater saphenous vein harvested endoscopically;  Surgeon: Kerin Perna, MD;  Location: [facility] OR;  Service: Open Heart Surgery;  Laterality: N/A;  . LEFT HEART CATH AND CORONARY ANGIOGRAPHY N/A 12/22/2017   Procedure: LEFT HEART CATH AND CORONARY ANGIOGRAPHY;  Surgeon: Yvonne Kendall, MD;  Location: [facility] INVASIVE CV LAB;  Service: Cardiovascular;  Laterality: N/A;  . SHOULDER ACROMIOPLASTY  07/01/2017  . TEE WITHOUT CARDIOVERSION N/A 12/23/2017   Procedure: TRANSESOPHAGEAL ECHOCARDIOGRAM (TEE);  Surgeon: Donata Clay, Theron Arista, MD;  Location: [facility] OR;  Service: Open Heart Surgery;  Laterality: N/A;    Current Medications: Outpatient Medications Prior to Visit  Medication Sig Dispense Refill  . aspirin EC 81 MG tablet Take 81 mg by mouth daily.    Marland Kitchen atorvastatin (LIPITOR) 80 MG tablet TAKE 1 TABLET BY MOUTH ONCE DAILY AT  6  PM 90 tablet 3  . bisoprolol (ZEBETA) 5 MG tablet Take 0.5 tablets (2.5 mg total) by mouth daily. 45 tablet 3  . escitalopram (LEXAPRO) 20 MG tablet Take 20 mg by mouth daily.    Marland Kitchen losartan (COZAAR) 25 MG tablet Take  25 mg by mouth daily.    . Menthol-Methyl Salicylate (BENGAY GREASELESS EX) Apply 1 application topically as needed (shoulder pain).    . multivitamin-iron-minerals-folic acid (CENTRUM) chewable tablet Chew 1 tablet by mouth daily.    . nitroGLYCERIN (NITROSTAT) 0.4 MG SL tablet Place 1 tablet (0.4 mg total) under the tongue every 5 (five) minutes as needed for chest pain. 25 tablet 3  . lisinopril (ZESTRIL) 2.5 MG tablet Take 2.5 mg by mouth daily.     No facility-administered medications prior to visit.      Allergies:   Patient has no known allergies.   Social History   Socioeconomic History  . Marital status: Married    Spouse name: Not on file  . Number of children: Not on file  . Years of education: Not on file  . Highest education level: Not on file  Occupational History  . Not on file  Tobacco Use  . Smoking status: Former Games developer  . Smokeless tobacco: Never Used  Vaping Use  . Vaping Use: Never used  Substance and Sexual Activity  . Alcohol use: Yes    Comment: on occasion  . Drug use: No  . Sexual activity: Not on file  Other Topics Concern  . Not on file  Social History Narrative  . Not on file   Social Determinants of Health   Financial Resource Strain: Not on file  Food Insecurity: Not on file  Transportation Needs: Not on file  Physical Activity: Not on file  Stress: Not on file  Social Connections: Not on file     Family History:  The patient's family history includes CAD (age of onset: 24) in his brother; Cancer in his father; Diabetes in his mother; Heart disease in his brother and mother; Hypertension in his mother.   Review of Systems:   Please see the history of present illness.     General:  No chills, fever, night sweats or weight changes. Positive for joint pain.  Cardiovascular:  No exertional chest pain, dyspnea on exertion, edema, orthopnea, palpitations, paroxysmal nocturnal dyspnea. Dermatological: No rash, lesions/masses Respiratory: No cough, dyspnea Urologic: No hematuria, dysuria Abdominal:   No nausea, vomiting, diarrhea, bright red blood per rectum, melena, or hematemesis Neurologic:  No visual changes, wkns, changes in mental status. All other systems reviewed and are otherwise negative except as noted above.   Physical Exam:    VS:  BP 134/68   Pulse (!) 56   Ht 5\' 9"  (1.753 m)   Wt 183 lb (83 kg)   SpO2 97%   BMI 27.02 kg/m    General: Well developed, well nourished,male appearing in no acute distress. Head:  Normocephalic, atraumatic. Neck: No carotid bruits. JVD not elevated.  Lungs: Respirations regular and unlabored, without wheezes or rales.  Heart: Regular rate and rhythm. No S3 or S4.  No murmur, no rubs, or gallops appreciated. Abdomen: Appears non-distended. No obvious abdominal masses. Msk:  Strength and tone appear normal for age. No obvious joint deformities or effusions. Extremities: No clubbing or cyanosis. No lower extremity edema.  Distal pedal pulses are 2+ bilaterally. Neuro: Alert and oriented X 3. Moves all extremities spontaneously. No focal deficits noted. Psych:  Responds to questions appropriately with a normal affect. Skin: No rashes or lesions noted  Wt Readings from Last 3 Encounters:  03/20/20 183 lb (83 kg)  12/12/19 170 lb (77.1 kg)  03/21/19 172 lb (78 kg)     Studies/Labs Reviewed:   EKG:  EKG is not ordered today.   Recent Labs: No results found for requested labs within last 8760 hours.   Lipid Panel    Component Value Date/Time   CHOL 196 12/23/2017 0520   TRIG 60 12/23/2017 0520   HDL 48 12/23/2017 0520   CHOLHDL 4.1 12/23/2017 0520   VLDL 12 12/23/2017 0520   LDLCALC 136 (H) 12/23/2017 0520    Additional studies/ records that were reviewed today include:   Cardiac Catheterization: 12/2017 Conclusions: 1. Critical multivessel CAD, as detailed below. 2. Severely reduced left ventricular systolic function with global hypokinesis and basal/mid inferior akinesis.  LVEF 30-35%. 3. Normal left ventricular filling pressure.  Recommendations: 1. Admit to cardiac ICU for urgent cardiac surgery consultation for CABG. 2. Initiate heparin infusion 2 hours after TR band deflation. 3. Aggressive secondary prevention. 4. Obtain transthoracic echocardiogram today.  Recommend uninterrupted dual antiplatelet therapy with Aspirin 81mg  daily and Clopidogrel 75mg  daily for a minimum of 12 months (ACS - Class I recommendation).  Initiation of clopidogrel  can be deferred until after CABG.   Echocardiogram: 03/2018 Study Conclusions   - Left ventricle: The cavity size was mildly dilated. Wall  thickness was at the upper limits of normal. Systolic function  was mildly to moderately reduced. The estimated ejection fraction  was in the range of 40% to 45%. Diffuse hypokinesis. There is  moderate hypokinesis of the basal-midinferior and inferoseptal  myocardium.  - Aortic valve: Mildly calcified annulus. Trileaflet.  - Mitral valve: There was mild regurgitation.  - Left atrium: The atrium was mildly dilated.  - Right atrium: Central venous pressure (est): 3 mm Hg.  - Atrial septum: No defect or patent foramen ovale was identified.  - Tricuspid valve: There was trivial regurgitation.  - Pulmonary arteries: PA peak pressure: 24 mm Hg (S).  - Pericardium, extracardiac: There was no pericardial effusion.   Carotid Dopplers: 03/2019 IMPRESSION: Minor carotid atherosclerosis. No hemodynamically significant ICA stenosis. Degree of narrowing less than 50% bilaterally by ultrasound criteria.  Patent antegrade vertebral flow bilaterally  Echocardiogram: 12/2019 IMPRESSIONS    1. Left ventricular ejection fraction, by estimation, is 45 to 50%. The  left ventricle has mildly decreased function. The left ventricle  demonstrates regional wall motion abnormalities (see scoring  diagram/findings for description). The left ventricular  internal cavity size was mildly dilated. Left ventricular diastolic  parameters are indeterminate.  2. Right ventricular systolic function is normal. The right ventricular  size is normal. There is normal pulmonary artery systolic pressure. The  estimated right ventricular systolic pressure is 23.2 mmHg.  3. Left atrial size was mildly dilated.  4. The mitral valve is grossly normal. Mild mitral valve regurgitation.  5. The aortic valve is tricuspid. Aortic valve regurgitation is not  visualized.   6. The inferior vena cava is normal in size with greater than 50%  respiratory variability, suggesting right atrial pressure of 3 mmHg.   Assessment:    1. Coronary artery disease involving native coronary artery of native heart without angina pectoris   2. Ischemic cardiomyopathy   3. Essential hypertension   4. Hyperlipidemia LDL goal <70      Plan:   In order of problems listed above:  1. CAD - He is s/p CABG in 12/2017 with LIMA-LAD, SVG-D1, Seq-RI-LCx, and SVG-PDA. He reports occasional shooting pain which resolves within seconds but denies any exertional chest pain. No recent SL NTG use.  - Continue current medication regimen with ASA 81mg  daily, Atorvastatin 80mg  daily and  Bisoprolol 2.5mg  daily.   2. Ischemic Cardiomyopathy - EF was at 40-45% in 12/2017, improved to 45-50% by most recent echocardiogram. He denies any orthopnea, PND or edema. - Continue current medication regimen with Bisoprolol 2.5mg  daily and Losartan 25mg  daily. Cannot further titrate Bisoprolol given HR in the 50's.  3. HTN - BP is well-controlled at 134/68 during today's visit. Continue current medication regimen.   4. HLD - Followed by PCP. LDL was at 69 in 11/2019. Continue Atorvastatin 80mg  daily.     Medication Adjustments/Labs and Tests Ordered: Current medicines are reviewed at length with the patient today.  Concerns regarding medicines are outlined above.  Medication changes, Labs and Tests ordered today are listed in the Patient Instructions below. Patient Instructions  Medication Instructions:   No changes. Continue current medication regimen.   Labwork:  None.   Testing/Procedures:  None.  Follow-Up:  With Randall An, PA-C or Dr. Diona Browner in 1 year.   Any Other Special Instructions Will Be Listed Below (If Applicable).   If you need a refill on your cardiac medications before your next appointment, please call your pharmacy.   Signed, Ellsworth Lennox, PA-C   03/20/2020 7:07 PM    [facility] [address] [place], [place] [address] Phone: [phone] Fax: [phone]

## 2020-03-20 NOTE — Patient Instructions (Signed)
Medication Instructions:   No changes. Continue current medication regimen.   Labwork:  None.   Testing/Procedures:  None.  Follow-Up:  With Randall An, PA-C or Dr. Diona Browner in 1 year.   Any Other Special Instructions Will Be Listed Below (If Applicable).     If you need a refill on your cardiac medications before your next appointment, please call your pharmacy.

## 2020-06-17 ENCOUNTER — Other Ambulatory Visit: Payer: Self-pay | Admitting: *Deleted

## 2020-08-15 ENCOUNTER — Other Ambulatory Visit: Payer: Self-pay

## 2020-08-15 MED ORDER — NITROGLYCERIN 0.4 MG SL SUBL: 0.4000 mg | SUBLINGUAL_TABLET | SUBLINGUAL | 3 refills | Status: DC | PRN

## 2020-08-15 NOTE — Telephone Encounter (Signed)
Refilled NTG to [facility]

## 2020-12-11 ENCOUNTER — Other Ambulatory Visit: Payer: Self-pay | Admitting: Cardiology

## 2021-03-19 NOTE — Progress Notes (Signed)
Cardiology Office Note    Date:  03/20/2021   ID:  Glenn [name], DOB 04-02-1956, MRN [id number]  PCP:  Benita Stabile, MD  Cardiologist: Nona Dell, MD    Chief Complaint  Patient presents with   Follow-up    Annual Visit    History of Present Illness:    Glenn [name] is a 65 y.o. male with past medical history of CAD (s/p CABG in 12/2017 with LIMA-LAD, SVG-D1, Seq-RI-LCx, and SVG-PDA), HFmrEF/ICM (EF 40-45% in 12/2017, at 45-50% in 12/2019), HTN, HLD and carotid artery stenosis who presents to the office today for annual follow-up.  He was last examined by myself in 03/2020 and denied any recent exertional chest pain or dyspnea on exertion. Did report occasional shooting pains along his precordium which had occurred since CABG. Was continued on his current cardiac medications including ASA 81 mg daily, Atorvastatin 80 mg daily, Bisoprolol 2.5 mg daily and Losartan 25 mg daily.  In talking with the patient today, he reports overall feeling well since his last office visit. He remains active in doing Production manager and denies any recent chest pain or dyspnea on exertion with this. Reports some dyspnea if he "overdoes it" but says this has been stable and no progression of symptoms. No recent orthopnea, PND, pitting edema or palpitations. He reports only occasional alcohol use but does consume a significant amount of caffeine including 3 cups of coffee on a daily basis and tea throughout the day.   Past Medical History:  Diagnosis Date   CAD (coronary artery disease)    a. 12/2017: VT Arrest during Treadmill Stress Test --> cath showing critical multivessel CAD including 90% LM stenosis -->  s/p CABGx5 on 12/23/2017 with LIMA-LAD, SVG-D1, Seq-RI-LCx, and SVG-PDA   Essential hypertension    Hyperlipidemia    Impaired fasting glucose    Palpitations     Past Surgical History:  Procedure Laterality Date   CORONARY ARTERY BYPASS GRAFT N/A 12/23/2017   Procedure: CORONARY  ARTERY BYPASS GRAFTING (CABG) x Five , using left internal mammary artery and right leg greater saphenous vein harvested endoscopically;  Surgeon: Kerin Perna, MD;  Location: [facility] OR;  Service: Open Heart Surgery;  Laterality: N/A;   LEFT HEART CATH AND CORONARY ANGIOGRAPHY N/A 12/22/2017   Procedure: LEFT HEART CATH AND CORONARY ANGIOGRAPHY;  Surgeon: Yvonne Kendall, MD;  Location: [facility];  Service: Cardiovascular;  Laterality: N/A;   SHOULDER ACROMIOPLASTY  07/01/2017   TEE WITHOUT CARDIOVERSION N/A 12/23/2017   Procedure: TRANSESOPHAGEAL ECHOCARDIOGRAM (TEE);  Surgeon: Donata Clay, Theron Arista, MD;  Location: [facility] OR;  Service: Open Heart Surgery;  Laterality: N/A;    Current Medications: Outpatient Medications Prior to Visit  Medication Sig Dispense Refill   aspirin EC 81 MG tablet Take 81 mg by mouth daily.     bisoprolol (ZEBETA) 5 MG tablet Take 1/2 (one-half) tablet by mouth once daily 45 tablet 2   escitalopram (LEXAPRO) 20 MG tablet Take 20 mg by mouth daily.     Menthol-Methyl Salicylate (BENGAY GREASELESS EX) Apply 1 application topically as needed (shoulder pain).     multivitamin-iron-minerals-folic acid (CENTRUM) chewable tablet Chew 1 tablet by mouth daily.     nitroGLYCERIN (NITROSTAT) 0.4 MG SL tablet Place 1 tablet (0.4 mg total) under the tongue every 5 (five) minutes as needed for chest pain. 25 tablet 3   atorvastatin (LIPITOR) 80 MG tablet TAKE 1 TABLET BY MOUTH ONCE DAILY AT  6  PM 90  tablet 3   losartan (COZAAR) 25 MG tablet Take 25 mg by mouth daily.     No facility-administered medications prior to visit.     Allergies:   Patient has no known allergies.   Social History   Socioeconomic History   Marital status: Married    Spouse name: Not on file   Number of children: Not on file   Years of education: Not on file   Highest education level: Not on file  Occupational History   Not on file  Tobacco Use   Smoking status: Former   Smokeless tobacco:  Never  Vaping Use   Vaping Use: Never used  Substance and Sexual Activity   Alcohol use: Yes    Comment: on occasion   Drug use: No   Sexual activity: Not on file  Other Topics Concern   Not on file  Social History Narrative   Not on file   Social Determinants of Health   Financial Resource Strain: Not on file  Food Insecurity: Not on file  Transportation Needs: Not on file  Physical Activity: Not on file  Stress: Not on file  Social Connections: Not on file     Family History:  The patient's family history includes CAD (age of onset: 80) in his brother; Cancer in his father; Diabetes in his mother; Heart disease in his brother and mother; Hypertension in his mother.   Review of Systems:    Please see the history of present illness.     All other systems reviewed and are otherwise negative except as noted above.   Physical Exam:    VS:  BP 124/68   Pulse (!) 58   Ht 5\' 9"  (1.753 m)   Wt 191 lb 12.8 oz (87 kg)   SpO2 98%   BMI 28.32 kg/m    General: Well developed, well nourished,male appearing in no acute distress. Head: Normocephalic, atraumatic. Neck: No carotid bruits. JVD not elevated.  Lungs: Respirations regular and unlabored, without wheezes or rales.  Heart: Regular rate and rhythm. No S3 or S4.  No murmur, no rubs, or gallops appreciated. Abdomen: Appears non-distended. No obvious abdominal masses. Msk:  Strength and tone appear normal for age. No obvious joint deformities or effusions. Extremities: No clubbing or cyanosis. No pitting edema.  Distal pedal pulses are 2+ bilaterally. Neuro: Alert and oriented X 3. Moves all extremities spontaneously. No focal deficits noted. Psych:  Responds to questions appropriately with a normal affect. Skin: No rashes or lesions noted  Wt Readings from Last 3 Encounters:  03/20/21 191 lb 12.8 oz (87 kg)  03/20/20 183 lb (83 kg)  12/12/19 170 lb (77.1 kg)     Studies/Labs Reviewed:   EKG:  EKG is ordered today.  The ekg ordered today demonstrates sinus bradycardia, HR 56 with known RBBB and isolated PVC.   Recent Labs: No results found for requested labs within last 8760 hours.   Lipid Panel    Component Value Date/Time   CHOL 196 12/23/2017 0520   TRIG 60 12/23/2017 0520   HDL 48 12/23/2017 0520   CHOLHDL 4.1 12/23/2017 0520   VLDL 12 12/23/2017 0520   LDLCALC 136 (H) 12/23/2017 0520    Additional studies/ records that were reviewed today include:   Cardiac Catheterization: 12/2017 Conclusions: Critical multivessel CAD, as detailed below. Severely reduced left ventricular systolic function with global hypokinesis and basal/mid inferior akinesis.  LVEF 30-35%. Normal left ventricular filling pressure.   Recommendations: Admit to cardiac ICU  for urgent cardiac surgery consultation for CABG. Initiate heparin infusion 2 hours after TR band deflation. Aggressive secondary prevention. Obtain transthoracic echocardiogram today.   Recommend uninterrupted dual antiplatelet therapy with Aspirin 81mg  daily and Clopidogrel 75mg  daily for a minimum of 12 months (ACS - Class I recommendation).  Initiation of clopidogrel can be deferred until after CABG.  Echocardiogram: 12/2019 IMPRESSIONS     1. Left ventricular ejection fraction, by estimation, is 45 to 50%. The  left ventricle has mildly decreased function. The left ventricle  demonstrates regional wall motion abnormalities (see scoring  diagram/findings for description). The left ventricular   internal cavity size was mildly dilated. Left ventricular diastolic  parameters are indeterminate.   2. Right ventricular systolic function is normal. The right ventricular  size is normal. There is normal pulmonary artery systolic pressure. The  estimated right ventricular systolic pressure is 23.2 mmHg.   3. Left atrial size was mildly dilated.   4. The mitral valve is grossly normal. Mild mitral valve regurgitation.   5. The aortic valve is  tricuspid. Aortic valve regurgitation is not  visualized.   6. The inferior vena cava is normal in size with greater than 50%  respiratory variability, suggesting right atrial pressure of 3 mmHg.   Assessment:    1. Coronary artery disease involving native coronary artery of native heart without angina pectoris   2. Ischemic cardiomyopathy   3. Essential hypertension   4. Hyperlipidemia LDL goal <70      Plan:   In order of problems listed above:  1. CAD - He is s/p CABG in 12/2017 with LIMA-LAD, SVG-D1, Seq-RI-LCx, and SVG-PDA. He remains active at baseline and denies any recent anginal symptoms. - Continue ASA 81 mg daily, Atorvastatin 80 mg daily and Bisoprolol 2.5 mg daily.  2. HFmrEF/ICM  - His EF was at 40-45% in 12/2017, at 45-50% in 12/2019. He remains active at baseline and denies any progressive dyspnea on exertion, orthopnea, PND or lower extremity edema. Appears euvolemic on examination today. - Continue Bisoprolol 2.5 mg daily and Losartan 25 mg daily. Did not further titrate beta-blocker therapy given his heart rate in the 50's.   3. HTN - His blood pressure is well-controlled at 124/68 during today's visit. Continue current medication regimen with Bisoprolol 2.5 mg daily and Losartan 25 mg daily.  4. HLD - His LDL was at 69 in 11/2019. He did have labs with his PCP within the past 6 months and will request a copy of these. He remains on Atorvastatin 80 mg daily with goal LDL less than 70 in the setting of known CAD.    Medication Adjustments/Labs and Tests Ordered: Current medicines are reviewed at length with the patient today.  Concerns regarding medicines are outlined above.  Medication changes, Labs and Tests ordered today are listed in the Patient Instructions below. Patient Instructions  Medication Instructions:  Your physician recommends that you continue on your current medications as directed. Please refer to the Current Medication list given to you  today.   Labwork: None   Testing/Procedures: None   Follow-Up: 1 year  Any Other Special Instructions Will Be Listed Below (If Applicable).  If you need a refill on your cardiac medications before your next appointment, please call your pharmacy.   Signed, 01/2020, PA-C  03/20/2021 1:58 PM    [facility] [address] [place], [address] [place] Phone: [phone] Fax: [phone]

## 2021-03-20 ENCOUNTER — Encounter: Payer: Self-pay | Admitting: Student

## 2021-03-20 ENCOUNTER — Ambulatory Visit (INDEPENDENT_AMBULATORY_CARE_PROVIDER_SITE_OTHER): Payer: Medicare Other | Admitting: Student

## 2021-03-20 ENCOUNTER — Other Ambulatory Visit: Payer: Self-pay

## 2021-03-20 VITALS — BP 124/68 | HR 58 | Ht 69.0 in | Wt 191.8 lb

## 2021-03-20 DIAGNOSIS — E785 Hyperlipidemia, unspecified: Secondary | ICD-10-CM | POA: Diagnosis not present

## 2021-03-20 DIAGNOSIS — I2510 Atherosclerotic heart disease of native coronary artery without angina pectoris: Secondary | ICD-10-CM | POA: Diagnosis not present

## 2021-03-20 DIAGNOSIS — I10 Essential (primary) hypertension: Secondary | ICD-10-CM | POA: Diagnosis not present

## 2021-03-20 DIAGNOSIS — I255 Ischemic cardiomyopathy: Secondary | ICD-10-CM | POA: Diagnosis not present

## 2021-03-20 MED ORDER — LOSARTAN POTASSIUM 25 MG PO TABS: 25.0000 mg | ORAL_TABLET | Freq: Every day | ORAL | 3 refills | Status: DC

## 2021-03-20 MED ORDER — ATORVASTATIN CALCIUM 80 MG PO TABS: ORAL_TABLET | ORAL | 3 refills | Status: AC

## 2021-03-20 NOTE — Patient Instructions (Signed)
Medication Instructions:  °Your physician recommends that you continue on your current medications as directed. Please refer to the Current Medication list given to you today. ° ° °Labwork: °None ° ° °Testing/Procedures: °None  ° °Follow-Up: °1 year ° °Any Other Special Instructions Will Be Listed Below (If Applicable). ° °If you need a refill on your cardiac medications before your next appointment, please call your pharmacy. ° °

## 2021-06-29 ENCOUNTER — Other Ambulatory Visit: Payer: Self-pay

## 2021-06-29 ENCOUNTER — Encounter: Payer: Self-pay | Admitting: Cardiology

## 2021-06-29 ENCOUNTER — Ambulatory Visit (INDEPENDENT_AMBULATORY_CARE_PROVIDER_SITE_OTHER): Payer: Medicare Other | Admitting: Cardiology

## 2021-06-29 VITALS — BP 118/64 | HR 62 | Ht 68.0 in | Wt 182.0 lb

## 2021-06-29 DIAGNOSIS — R55 Syncope and collapse: Secondary | ICD-10-CM | POA: Diagnosis not present

## 2021-06-29 DIAGNOSIS — I255 Ischemic cardiomyopathy: Secondary | ICD-10-CM

## 2021-06-29 DIAGNOSIS — I25119 Atherosclerotic heart disease of native coronary artery with unspecified angina pectoris: Secondary | ICD-10-CM

## 2021-06-29 NOTE — Patient Instructions (Signed)
Medication Instructions:  Your physician recommends that you continue on your current medications as directed. Please refer to the Current Medication list given to you today.   Labwork: None today  Testing/Procedures: Your physician has requested that you have an echocardiogram. Echocardiography is a painless test that uses sound waves to create images of your heart. It provides your doctor with information about the size and shape of your heart and how well your heart's chambers and valves are working. This procedure takes approximately one hour. There are no restrictions for this procedure.   Follow-Up: 6 months  Any Other Special Instructions Will Be Listed Below (If Applicable).  If you need a refill on your cardiac medications before your next appointment, please call your pharmacy.  

## 2021-06-29 NOTE — Progress Notes (Signed)
? ? ?Cardiology Office Note ? ?Date: 06/29/2021  ? ?ID: Glenn [name], DOB 1955/08/21, MRN [id number] ? ?PCP:  Benita Stabile, MD  ?Cardiologist:  Nona Dell, MD ?Electrophysiologist:  None  ? ?Chief Complaint  ?Patient presents with  ? Cardiac follow-up  ? ? ?History of Present Illness: ?Glenn [name] is a 66 y.o. male last seen in December 2022 by Ms. Strader PA-C.  He presents to the office reporting a recent episode of syncope.  Early Sunday morning he got out of bed to go to the bathroom, states that when he stood up he felt lightheaded and also nauseated.  Broke out in a sweat.  He got into the bathroom and was standing in front of the toilet when he apparently briefly passed out, no injury, went back to bed thereafter.  He does not report any pain in his chest, no palpitations.  At baseline he has not reported any recent increase in angina, no nitroglycerin use, no obvious change in functional capacity.  He reports compliance with his medications.  Today he states he feels at baseline. ? ?He saw his PCP this morning.  I personally reviewed his ECG obtained earlier today at PCP office showing sinus bradycardia with right bundle branch block which is old.  He also had lab work obtained.  Total CK elevated at 604 with normal MB fraction and also troponin T normal.  Argues against ACS and likely musculoskeletal source. ? ?Past Medical History:  ?Diagnosis Date  ? CAD (coronary artery disease)   ? a. 12/2017: VT Arrest during Treadmill Stress Test --> cath showing critical multivessel CAD including 90% LM stenosis -->  s/p CABGx5 on 12/23/2017 with LIMA-LAD, SVG-D1, Seq-RI-LCx, and SVG-PDA  ? Essential hypertension   ? Hyperlipidemia   ? Impaired fasting glucose   ? Palpitations   ? ? ?Past Surgical History:  ?Procedure Laterality Date  ? CORONARY ARTERY BYPASS GRAFT N/A 12/23/2017  ? Procedure: CORONARY ARTERY BYPASS GRAFTING (CABG) x Five , using left internal mammary artery and right leg greater saphenous vein  harvested endoscopically;  Surgeon: Kerin Perna, MD;  Location: [facility] OR;  Service: Open Heart Surgery;  Laterality: N/A;  ? LEFT HEART CATH AND CORONARY ANGIOGRAPHY N/A 12/22/2017  ? Procedure: LEFT HEART CATH AND CORONARY ANGIOGRAPHY;  Surgeon: Yvonne Kendall, MD;  Location: [facility] INV[facility] LAB;  Service: Cardiovascular;  Laterality: N/A;  ? SHOULDER ACROMIOPLASTY  07/01/2017  ? TEE WITHOUT CARDIOVERSION N/A 12/23/2017  ? Procedure: TRANSESOPHAGEAL ECHOCARDIOGRAM (TEE);  Surgeon: Donata Clay, Theron Arista, MD;  Location: [facility] OR;  Service: Open Heart Surgery;  Laterality: N/A;  ? ? ?Current Outpatient Medications  ?Medication Sig Dispense Refill  ? aspirin EC 81 MG tablet Take 81 mg by mouth daily.    ? atorvastatin (LIPITOR) 80 MG tablet TAKE 1 TABLET BY MOUTH ONCE DAILY AT  6  PM 90 tablet 3  ? bisoprolol (ZEBETA) 5 MG tablet Take 1/2 (one-half) tablet by mouth once daily 45 tablet 2  ? escitalopram (LEXAPRO) 20 MG tablet Take 20 mg by mouth daily.    ? losartan (COZAAR) 25 MG tablet Take 1 tablet (25 mg total) by mouth daily. 90 tablet 3  ? Menthol-Methyl Salicylate (BENGAY GREASELESS EX) Apply 1 application topically as needed (shoulder pain).    ? multivitamin-iron-minerals-folic acid (CENTRUM) chewable tablet Chew 1 tablet by mouth daily.    ? nitroGLYCERIN (NITROSTAT) 0.4 MG SL tablet Place 1 tablet (0.4 mg total) under the tongue every 5 (five)  minutes as needed for chest pain. 25 tablet 3  ? ?No current facility-administered medications for this visit.  ? ?Allergies:  Patient has no known allergies.  ? ?ROS: No orthopnea or PND.  No sudden syncope. ? ?Physical Exam: ?VS:  BP 118/64   Pulse 62   Ht 5\' 8"  (1.727 m)   Wt 182 lb (82.6 kg)   SpO2 97%   BMI 27.67 kg/m? , BMI Body mass index is 27.67 kg/m?. ? ?Wt Readings from Last 3 Encounters:  ?06/29/21 182 lb (82.6 kg)  ?03/20/21 191 lb 12.8 oz (87 kg)  ?03/20/20 183 lb (83 kg)  ?  ?General: Patient appears comfortable at rest. ?HEENT: Conjunctiva and lids  normal, wearing a mask. ?Neck: Supple, no elevated JVP or carotid bruits, no thyromegaly. ?Lungs: Clear to auscultation, nonlabored breathing at rest. ?Cardiac: Regular rate and rhythm, no S3 or significant systolic murmur, no pericardial rub. ?Extremities: No pitting edema. ? ?ECG:  An ECG dated 03/20/2021 was personally reviewed today and demonstrated:  Sinus rhythm with right bundle branch block and PVC. ? ?Recent Labwork: ? ?June 2022: Hemoglobin A1c 6.0%, cholesterol 146, triglycerides 184, HDL 45, LDL 70, BUN 17, creatinine 1.08, potassium 4.5, AST 30, ALT 32, hemoglobin 15.2, platelets 159 ? ?Other Studies Reviewed Today: ? ?Echocardiogram 12/19/2019: ? 1. Left ventricular ejection fraction, by estimation, is 45 to 50%. The  ?left ventricle has mildly decreased function. The left ventricle  ?demonstrates regional wall motion abnormalities (see scoring  ?diagram/findings for description). The left ventricular  ? internal cavity size was mildly dilated. Left ventricular diastolic  ?parameters are indeterminate.  ? 2. Right ventricular systolic function is normal. The right ventricular  ?size is normal. There is normal pulmonary artery systolic pressure. The  ?estimated right ventricular systolic pressure is 23.2 mmHg.  ? 3. Left atrial size was mildly dilated.  ? 4. The mitral valve is grossly normal. Mild mitral valve regurgitation.  ? 5. The aortic valve is tricuspid. Aortic valve regurgitation is not  ?visualized.  ? 6. The inferior vena cava is normal in size with greater than 50%  ?respiratory variability, suggesting right atrial pressure of 3 mmHg.  ? ?Assessment and Plan: ? ?1.  Recent episode of syncope as outlined above, description consistent with neurocardiogenic syncope.  No associated chest pain or palpitations per discussion today.  ECG is stable.  CK-MB and troponin T obtained at PCP office normal.  We will update his echocardiogram to ensure no change in LVEF, but otherwise continue with  observation on current medical therapy.  No clear indication for cardiac monitor or ischemic testing at this point. ? ?2.  Multivessel CAD status post CABG in 2019.  He does not report any increasing angina or nitroglycerin use at baseline.  Continue aspirin, Zebeta, Cozaar, Lipitor, and as needed nitroglycerin. ? ?3.  HFmrEF with ischemic cardiomyopathy, LVEF 45 to 50% by echocardiogram in September 2021.  We will plan an updated echocardiogram to ensure no significant decrease in LVEF that would prompt further testing or medication adjustments. ? ?Medication Adjustments/Labs and Tests Ordered: ?Current medicines are reviewed at length with the patient today.  Concerns regarding medicines are outlined above.  ? ?Tests Ordered: ?Orders Placed This Encounter  ?Procedures  ? ECHOCARDIOGRAM COMPLETE  ? ? ?Medication Changes: ?No orders of the defined types were placed in this encounter. ? ? ?Disposition:  Follow up  6 months. ? ?Signed, ?Jonelle Sidle, MD, [facility] ?06/29/2021 2:38 PM    ?[facility]  [facility] ?[address] [place], [place] [address] ?Phone: [phone]; Fax: [phone]  ?

## 2021-07-10 ENCOUNTER — Ambulatory Visit (HOSPITAL_COMMUNITY)
Admission: RE | Admit: 2021-07-10 | Discharge: 2021-07-10 | Disposition: A | Discharge status: Home / Self Care | Payer: Medicare Other | Source: Ambulatory Visit | Attending: Cardiology | Admitting: Cardiology

## 2021-07-10 DIAGNOSIS — I255 Ischemic cardiomyopathy: Secondary | ICD-10-CM | POA: Diagnosis not present

## 2021-07-10 LAB — ECHOCARDIOGRAM COMPLETE
AR max vel: 1.34 cm2
AV Area VTI: 1.62 cm2
AV Area mean vel: 1.49 cm2
AV Mean grad: 5.1 mmHg
AV Peak grad: 12.6 mmHg
Ao pk vel: 1.78 m/s
Area-P 1/2: 3.91 cm2
S' Lateral: 4 cm

## 2021-07-10 NOTE — Progress Notes (Signed)
*  PRELIMINARY RESULTS* ?Echocardiogram ?2D Echocardiogram has been performed. ? ?Stacey Drain ?07/10/2021, 12:17 PM ?

## 2021-07-13 ENCOUNTER — Telehealth: Payer: Self-pay

## 2021-07-13 NOTE — Telephone Encounter (Signed)
Patient notified and verbalized understanding. Pt had no questions or concerns at this time. Pcp copied.  ?

## 2021-07-13 NOTE — Telephone Encounter (Signed)
-----   Message from Jonelle Sidle, MD sent at 07/10/2021 12:49 PM EDT ----- ?Results reviewed.  LVEF relatively stable, low normal range at 50%, no major valvular abnormalities.  Continue with observation and follow-up as arranged. ?

## 2021-08-25 ENCOUNTER — Telehealth: Payer: Self-pay

## 2021-08-25 MED ORDER — BISOPROLOL FUMARATE 5 MG PO TABS
ORAL_TABLET | ORAL | 2 refills | Status: DC
Start: 2021-08-25 — End: 2022-05-28

## 2021-08-25 NOTE — Telephone Encounter (Signed)
Medication refill request approved for Bisoprolol 5 mg tablets and sent to [facility].  ?

## 2022-05-03 ENCOUNTER — Other Ambulatory Visit: Payer: Self-pay | Admitting: Student

## 2022-05-11 NOTE — Progress Notes (Signed)
Cardiology Office Note  Date: 05/12/2022   ID: Drako, Maese 1955/08/09, MRN [id number]  PCP:  Celene Squibb, MD  Cardiologist:  Rozann Lesches, MD Electrophysiologist:  None   Chief Complaint  Patient presents with   Cardiac follow-up    History of Present Illness: Glenn [name] is a 67 y.o. male last seen in March 2023.  He is here for a follow-up visit.  Reports no angina, no palpitations or syncope.  He has been doing some Architect work around his house, finishing out a basement.  Reports NYHA class II dyspnea.  He is following with Dr. Nevada Crane, has visit scheduled in the next month.  I did review his last lab work.  I personally reviewed his ECG today which shows sinus rhythm with right bundle branch block and PVCs.  Follow-up echocardiogram from March of last year revealed LVEF 50% with wall motion abnormalities consistent with known ischemic heart disease.  I reviewed his medications, he reports compliance with therapy.  Past Medical History:  Diagnosis Date   CAD (coronary artery disease)    a. 12/2017: VT Arrest during Treadmill Stress Test --> cath showing critical multivessel CAD including 90% LM stenosis -->  s/p CABGx5 on 12/23/2017 with LIMA-LAD, SVG-D1, Seq-RI-LCx, and SVG-PDA   Essential hypertension    Hyperlipidemia    Impaired fasting glucose    Palpitations     Current Outpatient Medications  Medication Sig Dispense Refill   aspirin EC 81 MG tablet Take 81 mg by mouth daily.     atorvastatin (LIPITOR) 80 MG tablet TAKE 1 TABLET BY MOUTH ONCE DAILY AT  6  PM 90 tablet 3   bisoprolol (ZEBETA) 5 MG tablet Take 1/2 (one-half) tablet by mouth once daily 45 tablet 2   escitalopram (LEXAPRO) 20 MG tablet Take 20 mg by mouth daily.     losartan (COZAAR) 25 MG tablet Take 1 tablet (25 mg total) by mouth daily. Please keep scheduled appointment for future refills. Thank you. 90 tablet 0   Menthol-Methyl Salicylate (BENGAY GREASELESS EX) Apply 1 application  topically as needed (shoulder pain).     multivitamin-iron-minerals-folic acid (CENTRUM) chewable tablet Chew 1 tablet by mouth daily.     nitroGLYCERIN (NITROSTAT) 0.4 MG SL tablet Place 1 tablet (0.4 mg total) under the tongue every 5 (five) minutes as needed for chest pain. 25 tablet 3   No current facility-administered medications for this visit.   Allergies:  Patient has no known allergies.   ROS: No orthopnea or PND.  No claudication.  Physical Exam: VS:  BP 112/78   Pulse (!) 55   Ht 5\' 8"  (1.727 m)   Wt 187 lb (84.8 kg)   BMI 28.43 kg/m , BMI Body mass index is 28.43 kg/m.  Wt Readings from Last 3 Encounters:  05/12/22 187 lb (84.8 kg)  06/29/21 182 lb (82.6 kg)  03/20/21 191 lb 12.8 oz (87 kg)    General: Patient appears comfortable at rest. HEENT: Conjunctiva and lids normal. Neck: Supple, no elevated JVP or carotid bruits. Lungs: Clear to auscultation, nonlabored breathing at rest. Cardiac: Regular rate and rhythm, no S3, 1/6 systolic murmur Extremities: No pitting edema.  ECG:  An ECG dated 06/29/2021 was personally reviewed today and demonstrated:  Sinus bradycardia with prolonged PR interval and right bundle branch block.  Recent Labwork:  August 2023: Hemoglobin 14.6, platelets 138, BUN 22, creatinine 1.13, potassium 4.6, AST 30, ALT 31, cholesterol 137, triglycerides 61, HDL 54, LDL 70,  hemoglobin A1c 5.5%  Other Studies Reviewed Today:  Echocardiogram 07/10/2021:  1. Left ventricular ejection fraction, by estimation, is 50%. The left  ventricle has low normal function. The left ventricle demonstrates  regional wall motion abnormalities (see scoring diagram/findings for  description). There is mild left ventricular  hypertrophy. Left ventricular diastolic parameters were normal.   2. Right ventricular systolic function is normal. The right ventricular  size is normal.   3. The mitral valve is normal in structure. Trivial mitral valve  regurgitation. No  evidence of mitral stenosis.   4. The aortic valve has an indeterminant number of cusps. Aortic valve  regurgitation is not visualized. No aortic stenosis is present.   5. The inferior vena cava is normal in size with greater than 50%  respiratory variability, suggesting right atrial pressure of 3 mmHg.   Assessment and Plan:  1.  Multivessel CAD status post CABG in 2019.  He reports no angina or nitroglycerin use.  ECG reviewed.  Continue aspirin, Zebeta, Cozaar, Lipitor, and as needed nitroglycerin.  2.  Ischemic cardiomyopathy, LVEF 50% by echocardiogram last year.  Plan to continue current regimen.  3.  Mixed hyperlipidemia, last LDL 70 on Lipitor.  Keep follow-up with Dr. Nevada Crane.  He is on 80 mg dose, may need to consider addition of Zetia depending on his next FLP.  Medication Adjustments/Labs and Tests Ordered: Current medicines are reviewed at length with the patient today.  Concerns regarding medicines are outlined above.   Tests Ordered: Orders Placed This Encounter  Procedures   EKG 12-Lead    Medication Changes: No orders of the defined types were placed in this encounter.   Disposition:  Follow up  6 months.  Signed, Satira Sark, MD, [facility] 05/12/2022 10:27 AM    [place] [facility] [address] [place], [place] [address] Phone: [phone]; Fax: [phone]

## 2022-05-12 ENCOUNTER — Encounter: Payer: Self-pay | Admitting: Cardiology

## 2022-05-12 ENCOUNTER — Ambulatory Visit: Payer: Medicare Other | Attending: Cardiology | Admitting: Cardiology

## 2022-05-12 VITALS — BP 112/78 | HR 55 | Ht 68.0 in | Wt 187.0 lb

## 2022-05-12 DIAGNOSIS — I255 Ischemic cardiomyopathy: Secondary | ICD-10-CM | POA: Insufficient documentation

## 2022-05-12 DIAGNOSIS — E782 Mixed hyperlipidemia: Secondary | ICD-10-CM | POA: Diagnosis not present

## 2022-05-12 DIAGNOSIS — I25119 Atherosclerotic heart disease of native coronary artery with unspecified angina pectoris: Secondary | ICD-10-CM | POA: Insufficient documentation

## 2022-05-12 NOTE — Patient Instructions (Signed)
Medication Instructions:  Your physician recommends that you continue on your current medications as directed. Please refer to the Current Medication list given to you today.   Labwork: None  Testing/Procedures: None  Follow-Up: Follow up with Dr. McDowell in 6 months.   Any Other Special Instructions Will Be Listed Below (If Applicable).     If you need a refill on your cardiac medications before your next appointment, please call your pharmacy.  

## 2022-05-28 ENCOUNTER — Other Ambulatory Visit: Payer: Self-pay | Admitting: Cardiology

## 2022-07-28 ENCOUNTER — Other Ambulatory Visit: Payer: Self-pay | Admitting: Student

## 2022-09-15 ENCOUNTER — Ambulatory Visit: Payer: Medicare Other | Admitting: Cardiology

## 2022-09-23 ENCOUNTER — Telehealth: Payer: Self-pay | Admitting: Cardiology

## 2022-09-23 ENCOUNTER — Telehealth: Payer: Self-pay | Admitting: *Deleted

## 2022-09-23 NOTE — Telephone Encounter (Signed)
Pt has been scheduled for tele pre op appt 09/30/22 @ 9:40. Med rec and consent are done.     Patient Consent for Virtual Visit        Glenn [name] has provided verbal consent on 09/23/2022 for a virtual visit (video or telephone).   CONSENT FOR VIRTUAL VISIT FOR:  Glenn [name]  By participating in this virtual visit I agree to the following:  I hereby voluntarily request, consent and authorize [facility] and its employed or contracted physicians, physician assistants, nurse practitioners or other licensed health care professionals (the Practitioner), to provide me with telemedicine health care services (the "Services") as deemed necessary by the treating Practitioner. I acknowledge and consent to receive the Services by the Practitioner via telemedicine. I understand that the telemedicine visit will involve communicating with the Practitioner through live audiovisual communication technology and the disclosure of certain medical information by electronic transmission. I acknowledge that I have been given the opportunity to request an in-person assessment or other available alternative prior to the telemedicine visit and am voluntarily participating in the telemedicine visit.  I understand that I have the right to withhold or withdraw my consent to the use of telemedicine in the course of my care at any time, without affecting my right to future care or treatment, and that the Practitioner or I may terminate the telemedicine visit at any time. I understand that I have the right to inspect all information obtained and/or recorded in the course of the telemedicine visit and may receive copies of available information for a reasonable fee.  I understand that some of the potential risks of receiving the Services via telemedicine include:  Delay or interruption in medical evaluation due to technological equipment failure or disruption; Information transmitted may not be sufficient (e.g. poor  resolution of images) to allow for appropriate medical decision making by the Practitioner; and/or  In rare instances, security protocols could fail, causing a breach of personal health information.  Furthermore, I acknowledge that it is my responsibility to provide information about my medical history, conditions and care that is complete and accurate to the best of my ability. I acknowledge that Practitioner's advice, recommendations, and/or decision may be based on factors not within their control, such as incomplete or inaccurate data provided by me or distortions of diagnostic images or specimens that may result from electronic transmissions. I understand that the practice of medicine is not an exact science and that Practitioner makes no warranties or guarantees regarding treatment outcomes. I acknowledge that a copy of this consent can be made available to me via my patient portal [facility] MyChart), or I can request a printed copy by calling the office of [facility].    I understand that my insurance will be billed for this visit.   I have read or had this consent read to me. I understand the contents of this consent, which adequately explains the benefits and risks of the Services being provided via telemedicine.  I have been provided ample opportunity to ask questions regarding this consent and the Services and have had my questions answered to my satisfaction. I give my informed consent for the services to be provided through the use of telemedicine in my medical care

## 2022-09-23 NOTE — Telephone Encounter (Signed)
Pt has been scheduled for tele pre op appt 09/30/22 @ 9:40. Med rec and consent are done.

## 2022-09-23 NOTE — Telephone Encounter (Signed)
Primary Cardiologist:Samuel Diona Browner, MD   Preoperative team, please contact this patient and set up a phone call appointment for further preoperative risk assessment. Please obtain consent and complete medication review. Thank you for your help.  Per office protocol and pending no concerning symptoms of ACS, he may hold aspirin for 5-7 days prior to procedure and should resume as soon as hemodynamically stable.    Levi Aland, NP-C  09/23/2022, 10:55 AM [address] Office [phone] Fax [phone]

## 2022-09-23 NOTE — Telephone Encounter (Signed)
   Pre-operative Risk Assessment    Patient Name: Glenn [name]  DOB: February 27, 1956 MRN: [id number]      Request for Surgical Clearance    Procedure:  Right Shoulder scope with rotator cuff repair  Date of Surgery:  Clearance TBD                                 Surgeon:   [facility]THOPEDICS  Surgeon's Group or Practice Name:  [facility]  Phone number:  [phone] Fax number:  [phone]    Type of Clearance Requested:   - Medical    Type of Anesthesia:  Not Indicated   Additional requests/questions:    Sallyanne Havers   09/23/2022, 8:40 AM

## 2022-09-30 ENCOUNTER — Ambulatory Visit: Payer: Medicare Other | Attending: Cardiology

## 2022-09-30 DIAGNOSIS — Z01810 Encounter for preprocedural cardiovascular examination: Secondary | ICD-10-CM

## 2022-09-30 NOTE — Progress Notes (Signed)
Virtual Visit via Telephone Note   Because of Glenn [name]'s co-morbid illnesses, he is at least at moderate risk for complications without adequate follow up.  This format is felt to be most appropriate for this patient at this time.  The patient did not have access to video technology/had technical difficulties with video requiring transitioning to audio format only (telephone).  All issues noted in this document were discussed and addressed.  No physical exam could be performed with this format.  Please refer to the patient's chart for his consent to telehealth for [facility].  Evaluation Performed:  Preoperative cardiovascular risk assessment _____________   Date:  09/30/2022   Patient ID:  Glenn [name], DOB 1955-08-10, MRN [id number] Patient Location:  Home Provider location:   Office  Primary Care Provider:  Benita Stabile, MD Primary Cardiologist:  Nona Dell, MD  Chief Complaint / Patient Profile   67 y.o. y/o male with a h/o coronary artery disease status post CABG, hyperlipidemia, palpitations, hypertension who is pending right shoulder arthroscopy with rotator cuff repair and presents today for telephonic preoperative cardiovascular risk assessment.  History of Present Illness    Glenn [name] is a 67 y.o. male who presents via audio/video conferencing for a telehealth visit today.  Pt was last seen in cardiology clinic on 05/12/22 by Dr. Diona Browner.  At that time Glenn [name] was doing well .  The patient is now pending procedure as outlined above. Since his last visit, he remains stable from a cardiac standpoint.  Today he denies chest pain, shortness of breath, lower extremity edema, fatigue, palpitations, melena, hematuria, hemoptysis, diaphoresis, weakness, presyncope, syncope, orthopnea, and PND.   Past Medical History    Past Medical History:  Diagnosis Date   CAD (coronary artery disease)    a. 12/2017: VT Arrest during Treadmill Stress Test -->  cath showing critical multivessel CAD including 90% LM stenosis -->  s/p CABGx5 on 12/23/2017 with LIMA-LAD, SVG-D1, Seq-RI-LCx, and SVG-PDA   Essential hypertension    Hyperlipidemia    Impaired fasting glucose    Palpitations    Past Surgical History:  Procedure Laterality Date   CORONARY ARTERY BYPASS GRAFT N/A 12/23/2017   Procedure: CORONARY ARTERY BYPASS GRAFTING (CABG) x Five , using left internal mammary artery and right leg greater saphenous vein harvested endoscopically;  Surgeon: Kerin Perna, MD;  Location: [facility] OR;  Service: Open Heart Surgery;  Laterality: N/A;   LEFT HEART CATH AND CORONARY ANGIOGRAPHY N/A 12/22/2017   Procedure: LEFT HEART CATH AND CORONARY ANGIOGRAPHY;  Surgeon: Yvonne Kendall, MD;  Location: [facility] INVASIVE CV LAB;  Service: Cardiovascular;  Laterality: N/A;   SHOULDER ACROMIOPLASTY  07/01/2017   TEE WITHOUT CARDIOVERSION N/A 12/23/2017   Procedure: TRANSESOPHAGEAL ECHOCARDIOGRAM (TEE);  Surgeon: Donata Clay, Theron Arista, MD;  Location: [facility] OR;  Service: Open Heart Surgery;  Laterality: N/A;    Allergies  No Known Allergies  Home Medications    Prior to Admission medications   Medication Sig Start Date End Date Taking? Authorizing Provider  aspirin EC 81 MG tablet Take 81 mg by mouth daily.    [provider]  atorvastatin (LIPITOR) 80 MG tablet TAKE 1 TABLET BY MOUTH ONCE DAILY AT  6  PM 03/20/21   Strader, [place] M, PA-C  bisoprolol (ZEBETA) 5 MG tablet Take 1/2 (one-half) tablet by mouth once daily 05/28/22   Jonelle Sidle, MD  escitalopram (LEXAPRO) 20 MG tablet Take 20 mg by mouth daily.  [provider]  losartan (COZAAR) 25 MG tablet Take 1 tablet (25 mg total) by mouth daily. 07/28/22   Jonelle Sidle, MD  Menthol-Methyl Salicylate [facility] GREASELESS EX) Apply 1 application topically as needed (shoulder pain).    [provider]  multivitamin-iron-minerals-folic acid (CENTRUM) chewable tablet Chew 1 tablet by mouth  daily.    [provider]  nitroGLYCERIN (NITROSTAT) 0.4 MG SL tablet Place 1 tablet (0.4 mg total) under the tongue every 5 (five) minutes as needed for chest pain. 08/15/20   Jonelle Sidle, MD    Physical Exam    Vital Signs:  Glenn [name] does not have vital signs available for review today.  Given telephonic nature of communication, physical exam is limited. AAOx3. NAD. Normal affect.  Speech and respirations are unlabored.  Accessory Clinical Findings    None  Assessment & Plan    1.  Preoperative Cardiovascular Risk Assessment: Right shoulder scope with rotator cuff repair, [facility] [id number]      Primary Cardiologist: Nona Dell, MD  Chart reviewed as part of pre-operative protocol coverage. Given past medical history and time since last visit, based on ACC/AHA guidelines, Glenn [name] would be at acceptable risk for the planned procedure without further cardiovascular testing.   His aspirin may be held for 5-7 days prior to his procedure.  Please resume as soon as hemostasis is achieved.  His RCRI is a class III risk, 6.6% risk of major cardiac event.  He is able to complete greater than 4 METS to physical activity.  Patient was advised that if he develops new symptoms prior to surgery to contact our office to arrange a follow-up appointment.  He verbalized understanding.  I will route this recommendation to the requesting party via Epic fax function and remove from pre-op pool.       Time:   Today, I have spent 5 minutes with the patient with telehealth technology discussing medical history, symptoms, and management plan.  Prior to his phone evaluation I spent greater than 10 minutes reviewing his past medical history and cardiac medications.   Ronney Asters, NP  09/30/2022, 7:22 AM

## 2022-11-15 NOTE — Progress Notes (Unsigned)
    Cardiology Office Note  Date: 11/16/2022   ID: Glenn [name], DOB 01/23/1956, MRN [id number]  History of Present Illness: Glenn [name] is a 67 y.o. male last seen in January.  He did have a phone visit in June prior to right shoulder arthroscopy and rotator cuff repair.  He is here today for follow-up, doing well from a cardiac perspective without angina or nitroglycerin use.  He hopes to get the sling off of his right arm in the next few days with orthopedic follow-up.  I reviewed his medications, he reports compliance with therapy.  He did have lab work earlier in the year as noted below and continues to follow with Dr. Margo Aye.  Physical Exam: VS:  BP 130/82   Pulse 60   Ht 5\' 9"  (1.753 m)   Wt 192 lb 12.8 oz (87.5 kg)   SpO2 96%   BMI 28.47 kg/m , BMI Body mass index is 28.47 kg/m.  Wt Readings from Last 3 Encounters:  11/16/22 192 lb 12.8 oz (87.5 kg)  05/12/22 187 lb (84.8 kg)  06/29/21 182 lb (82.6 kg)    General: Patient appears comfortable at rest. HEENT: Conjunctiva and lids normal. Neck: Supple, no elevated JVP or carotid bruits. Lungs: Clear to auscultation, nonlabored breathing at rest. Cardiac: Regular rate and rhythm, no S3, 1/6 systolic murmur. Extremities: No pitting edema.  ECG:  An ECG dated 05/12/2022 was personally reviewed today and demonstrated:  Sinus rhythm with right bundle branch block and PVCs.  Labwork:  February 2024: Hemoglobin 14.7, platelets 151, BUN 19, creatinine 1.09, potassium 4.7, AST 31, ALT 31, cholesterol 156, triglycerides 117, HDL 54, LDL 81, hemoglobin A1c 6% June 2024: Hemoglobin 13.9, platelets 141, potassium 4.3, BUN 20, creatinine 1.1, AST 21, ALT 35  Other Studies Reviewed Today:  Echocardiogram 07/10/2021:  1. Left ventricular ejection fraction, by estimation, is 50%. The left  ventricle has low normal function. The left ventricle demonstrates  regional wall motion abnormalities (see scoring diagram/findings for   description). There is mild left ventricular  hypertrophy. Left ventricular diastolic parameters were normal.   2. Right ventricular systolic function is normal. The right ventricular  size is normal.   3. The mitral valve is normal in structure. Trivial mitral valve  regurgitation. No evidence of mitral stenosis.   4. The aortic valve has an indeterminant number of cusps. Aortic valve  regurgitation is not visualized. No aortic stenosis is present.   5. The inferior vena cava is normal in size with greater than 50%  respiratory variability, suggesting right atrial pressure of 3 mmHg.   Assessment and Plan:  1.  Multivessel CAD status post CABG in 2019 with LIMA to LAD, SVG to first diagonal, SVG to ramus intermedius and circumflex, and SVG to PDA.  LVEF 50% by echocardiogram in March 2023.  He reports no active angina at this time on medical therapy.  Continue aspirin, bisoprolol, Lipitor, Cozaar, and as needed nitroglycerin.  2.  Mixed hyperlipidemia.  LDL 81 in February.  He is on Lipitor 80 mg daily and will have follow-up lab work with PCP.  Ideally LDL should be 55 or less with vascular disease, can add Zetia next.  3.  Essential hypertension.  No change in current regimen.  Disposition:  Follow up  6 months.  Signed, Jonelle Sidle, M.D., F.A.C.C. [facility] at [facility]

## 2022-11-16 ENCOUNTER — Ambulatory Visit: Payer: Medicare Other | Attending: Cardiology | Admitting: Cardiology

## 2022-11-16 ENCOUNTER — Encounter: Payer: Self-pay | Admitting: Cardiology

## 2022-11-16 VITALS — BP 130/82 | HR 60 | Ht 69.0 in | Wt 192.8 lb

## 2022-11-16 DIAGNOSIS — I10 Essential (primary) hypertension: Secondary | ICD-10-CM

## 2022-11-16 DIAGNOSIS — I25119 Atherosclerotic heart disease of native coronary artery with unspecified angina pectoris: Secondary | ICD-10-CM

## 2022-11-16 DIAGNOSIS — E782 Mixed hyperlipidemia: Secondary | ICD-10-CM

## 2022-11-16 NOTE — Patient Instructions (Signed)
Medication Instructions:  Your physician recommends that you continue on your current medications as directed. Please refer to the Current Medication list given to you today.  *If you need a refill on your cardiac medications before your next appointment, please call your pharmacy*   Lab Work: NONE   If you have labs (blood work) drawn today and your tests are completely normal, you will receive your results only by: MyChart Message (if you have MyChart) OR A paper copy in the mail If you have any lab test that is abnormal or we need to change your treatment, we will call you to review the results.   Testing/Procedures: NONE    Follow-Up: At [facility], you and your health needs are our priority.  As part of our continuing mission to provide you with exceptional heart care, we have created designated Provider Care Teams.  These Care Teams include your primary Cardiologist (physician) and Advanced Practice Providers (APPs -  Physician Assistants and Nurse Practitioners) who all work together to provide you with the care you need, when you need it.  We recommend signing up for the patient portal called "MyChart".  Sign up information is provided on this After Visit Summary.  MyChart is used to connect with patients for Virtual Visits (Telemedicine).  Patients are able to view lab/test results, encounter notes, upcoming appointments, etc.  Non-urgent messages can be sent to your provider as well.   To learn more about what you can do with MyChart, go to [email or web address].    Your next appointment:   6 month(s)  Provider:   You may see Samuel McDowell, MD or one of the following Advanced Practice Providers on your designated Care Team:   Brittany Strader, PA-C  Michele Lenze, PA-C     Other Instructions Thank you for choosing  HeartCare!    

## 2022-12-09 ENCOUNTER — Encounter: Payer: Self-pay | Admitting: Internal Medicine

## 2022-12-30 ENCOUNTER — Other Ambulatory Visit: Payer: Self-pay | Admitting: Orthopedic Surgery

## 2022-12-30 DIAGNOSIS — M75122 Complete rotator cuff tear or rupture of left shoulder, not specified as traumatic: Secondary | ICD-10-CM

## 2023-01-13 ENCOUNTER — Ambulatory Visit
Admission: RE | Admit: 2023-01-13 | Discharge: 2023-01-13 | Disposition: A | Discharge status: Home / Self Care | Payer: Medicare Other | Source: Ambulatory Visit | Attending: Orthopedic Surgery | Admitting: Orthopedic Surgery

## 2023-01-13 DIAGNOSIS — M75122 Complete rotator cuff tear or rupture of left shoulder, not specified as traumatic: Secondary | ICD-10-CM

## 2023-05-18 ENCOUNTER — Other Ambulatory Visit: Payer: Self-pay | Admitting: Cardiology

## 2023-05-25 ENCOUNTER — Ambulatory Visit: Payer: Medicare Other | Attending: Cardiology | Admitting: Cardiology

## 2023-05-25 ENCOUNTER — Encounter: Payer: Self-pay | Admitting: Cardiology

## 2023-05-25 VITALS — BP 118/70 | HR 58 | Ht 69.0 in | Wt 191.4 lb

## 2023-05-25 DIAGNOSIS — I10 Essential (primary) hypertension: Secondary | ICD-10-CM | POA: Insufficient documentation

## 2023-05-25 DIAGNOSIS — I25119 Atherosclerotic heart disease of native coronary artery with unspecified angina pectoris: Secondary | ICD-10-CM | POA: Insufficient documentation

## 2023-05-25 DIAGNOSIS — E782 Mixed hyperlipidemia: Secondary | ICD-10-CM | POA: Insufficient documentation

## 2023-05-25 MED ORDER — EZETIMIBE 10 MG PO TABS: 10.0000 mg | ORAL_TABLET | Freq: Every day | ORAL | 3 refills | Status: AC

## 2023-05-25 MED ORDER — NITROGLYCERIN 0.4 MG SL SUBL: 0.4000 mg | SUBLINGUAL_TABLET | SUBLINGUAL | 3 refills | Status: AC | PRN

## 2023-05-25 NOTE — Patient Instructions (Signed)
Medication Instructions:   START Zetia 10 mg daily   I refilled your nitroglycerine   Labwork: None today  Testing/Procedures: None today  Follow-Up: 6 months  Any Other Special Instructions Will Be Listed Below (If Applicable).  If you need a refill on your cardiac medications before your next appointment, please call your pharmacy.

## 2023-05-25 NOTE — Progress Notes (Signed)
    Cardiology Office Note  Date: 05/25/2023   ID: DEDRICK [name], DOB [birth year], MRN [id number]  History of Present Illness: Glenn [name] is a 68 y.o. male last seen in August 2024.  He is here for a routine visit.  Reports no angina or nitroglycerin use, stable NYHA class II dyspnea, no change in stamina, no palpitations or syncope.  I went over his medications.  Current regimen includes aspirin, Lipitor 80 mg daily, bisoprolol, Cozaar, and as needed nitroglycerin.  Last LDL 86 in August 2024.  I reviewed his ECG today which shows sinus bradycardia/sinus arrhythmia at 56 bpm with prolonged PR interval and right bundle branch block.  Physical Exam: VS:  BP 118/70 (BP Location: Left Arm, Patient Position: Sitting, Cuff Size: Normal)   Pulse (!) 58   Ht 5\' 9"  (1.753 m)   Wt 191 lb 6.4 oz (86.8 kg)   SpO2 97%   BMI 28.26 kg/m , BMI Body mass index is 28.26 kg/m.  Wt Readings from Last 3 Encounters:  05/25/23 191 lb 6.4 oz (86.8 kg)  11/16/22 192 lb 12.8 oz (87.5 kg)  05/12/22 187 lb (84.8 kg)    General: Patient appears comfortable at rest. HEENT: Conjunctiva and lids normal. Neck: Supple, no elevated JVP or carotid bruits. Lungs: Clear to auscultation, nonlabored breathing at rest. Cardiac: Regular rate and rhythm, no S3, 1/6 systolic murmur.  ECG:  An ECG dated 05/12/2022 was personally reviewed today and demonstrated:  Sinus rhythm with right bundle branch block and PVCs.  Labwork:  August 2024: Hemoglobin 14.7, platelets 148, BUN 26, creatinine 1.05, potassium 4.6, AST 23, ALT 27, cholesterol 159, triglycerides 176, HDL 43, LDL 86, hemoglobin A1c 6.1%  Other Studies Reviewed Today:  No interval cardiac testing for review today.  Assessment and Plan:  1.  Multivessel CAD status post CABG in 2019 with LIMA to LAD, SVG to first diagonal, SVG to ramus intermedius and circumflex, and SVG to PDA.  LVEF 50% by echocardiogram in March 2023.  He is doing well without angina or  nitroglycerin use, refill provided for fresh bottle.  Continue aspirin and Lipitor.   2.  Mixed hyperlipidemia.  LDL 86 in August 2024.  He is on Lipitor 80 mg daily.  Discussed diet and also plan to add Zetia 10 mg daily.  Can reassess lipids in the next 6 months.   3.  Primary hypertension.  Blood pressure is well-controlled today.  Continue bisoprolol and Cozaar.  Disposition:  Follow up  6 months.  Signed, Jonelle Sidle, M.D., F.A.C.C. [facility] at [facility]

## 2023-06-13 ENCOUNTER — Encounter: Payer: Self-pay | Admitting: Internal Medicine

## 2023-08-09 ENCOUNTER — Other Ambulatory Visit: Payer: Self-pay | Admitting: Cardiology

## 2023-08-16 ENCOUNTER — Other Ambulatory Visit: Payer: Self-pay | Admitting: Cardiology

## 2023-11-09 ENCOUNTER — Other Ambulatory Visit: Payer: Self-pay | Admitting: Cardiology

## 2023-11-14 ENCOUNTER — Encounter: Payer: Self-pay | Admitting: Internal Medicine

## 2023-12-19 ENCOUNTER — Ambulatory Visit: Admitting: Cardiology

## 2024-01-11 ENCOUNTER — Ambulatory Visit: Attending: Cardiology | Admitting: Cardiology

## 2024-01-11 ENCOUNTER — Encounter: Payer: Self-pay | Admitting: Cardiology

## 2024-01-11 VITALS — BP 132/70 | HR 60 | Ht 69.0 in | Wt 186.0 lb

## 2024-01-11 DIAGNOSIS — R079 Chest pain, unspecified: Secondary | ICD-10-CM | POA: Insufficient documentation

## 2024-01-11 DIAGNOSIS — I25119 Atherosclerotic heart disease of native coronary artery with unspecified angina pectoris: Secondary | ICD-10-CM | POA: Insufficient documentation

## 2024-01-11 DIAGNOSIS — I10 Essential (primary) hypertension: Secondary | ICD-10-CM | POA: Insufficient documentation

## 2024-01-11 DIAGNOSIS — E782 Mixed hyperlipidemia: Secondary | ICD-10-CM | POA: Insufficient documentation

## 2024-01-11 MED ORDER — BISOPROLOL FUMARATE 5 MG PO TABS: 2.5000 mg | ORAL_TABLET | Freq: Every day | ORAL | 3 refills | Status: AC

## 2024-01-11 NOTE — Patient Instructions (Addendum)
 Medication Instructions:   DECREASE Bisoprolol  to 2.5 mg daily   Labwork: None today  Testing/Procedures: Your physician has requested that you have a lexiscan  myoview . For further information please visit [email or web address] Please follow instruction sheet, as given.   Follow-Up: To be determined after testing  Any Other Special Instructions Will Be Listed Below (If Applicable).  If you need a refill on your cardiac medications before your next appointment, please call your pharmacy.

## 2024-01-11 NOTE — Progress Notes (Signed)
    Cardiology Office Note  Date: 01/11/2024   ID: Glenn [name], DOB February 22, 1956, MRN [id number]  History of Present Illness: Glenn [name] is a 68 y.o. male last seen in February.  He is here for a follow-up visit.  Continues to work in [employer].  States that he has had intermittent episodes of exertional fatigue and chest tightness consistent with prior angina.  No associated palpitations or syncope, no symptoms at rest.  We reviewed his medications.  He reports compliance with current therapy, has been relatively bradycardic with evidence of conduction system disease by ECG.  We discussed reducing bisoprolol  for now.  He did have follow-up lipids in July that showed much better control with LDL 38 and HDL 57.  He has not had to take any nitroglycerin  recently.  I reviewed his ECG today which shows sinus bradycardia with prolonged PR interval, right bundle branch block, and PAC.  Physical Exam: VS:  BP 132/70 (BP Location: Left Arm)   Pulse 60   Ht 5' 9 (1.753 m)   Wt 186 lb (84.4 kg)   SpO2 97%   BMI 27.47 kg/m , BMI Body mass index is 27.47 kg/m.  Wt Readings from Last 3 Encounters:  01/11/24 186 lb (84.4 kg)  05/25/23 191 lb 6.4 oz (86.8 kg)  11/16/22 192 lb 12.8 oz (87.5 kg)    General: Patient appears comfortable at rest. HEENT: Conjunctiva and lids normal. Neck: Supple, no elevated JVP or carotid bruits. Lungs: Clear to auscultation, nonlabored breathing at rest. Cardiac: Regular rate and rhythm, no S3, 1/6 systolic murmur. Extremities: No pitting edema.  ECG:  An ECG dated 05/25/2023 was personally reviewed today and demonstrated:  Sinus bradycardia with sinus arrhythmia, prolonged PR interval and right bundle branch block.  Labwork:  July 2025: Hemoglobin 13.7, platelets 150, BUN 23, creatinine 1.05, potassium 4.4, AST 33, ALT 27, cholesterol 109, triglycerides 61, HDL 57, LDL 38, hemoglobin A1c 5.9%  Other Studies Reviewed Today:  No interval cardiac testing  for review today.  Assessment and Plan:  1.  Multivessel CAD status post CABG in 2019 with LIMA to LAD, SVG to first diagonal, SVG to ramus intermedius and circumflex, and SVG to PDA.  LVEF 50% by echocardiogram in March 2023.  He reports intermittent exertional angina but no interval nitroglycerin  use, ECG reviewed today and stable.  Plan to reduce bisoprolol  to 2.5 mg daily in case any of his symptoms are a reflection of symptomatic bradycardia (heart rate 50-60).  Proceed with Lexiscan  Myoview  to evaluate ischemic burden as well.  Otherwise continue aspirin  81 mg daily, Lipitor  80 mg daily, and Zetia  10 mg daily.   2.  Mixed hyperlipidemia.  LDL 38 and HDL 57 in July.  Continue Lipitor  80 mg daily and Zetia  10 mg daily.   3.  Primary hypertension.  Continue Cozaar  25 mg daily.  Asked him to consider getting a home blood pressure cuff for monitoring.  Disposition:  Follow up test results.  Signed, Jayson JUDITHANN Sierras, M.D., F.A.C.C. [facility] at [facility]

## 2024-01-12 ENCOUNTER — Telehealth (HOSPITAL_COMMUNITY): Payer: Self-pay | Admitting: Surgery

## 2024-01-12 NOTE — Telephone Encounter (Signed)
 I called the pt to remind him of his stress test tomorrow, with his arrival time being 7:45.  I went over the following instructions with the pt:  do not eat/drink 6 hrs before the test (water with meds is fine, just no caffeine or chocolate), no smoking 8 hrs before the test, wear comfortable clothes, do not wear any lotions/oils, and check in at the front desk on arrival.  The pt verbalized understanding.

## 2024-01-13 ENCOUNTER — Ambulatory Visit (HOSPITAL_COMMUNITY)
Admission: RE | Admit: 2024-01-13 | Discharge: 2024-01-13 | Disposition: A | Discharge status: Home / Self Care | Source: Ambulatory Visit | Attending: Cardiology | Admitting: Cardiology

## 2024-01-13 ENCOUNTER — Telehealth: Payer: Self-pay | Admitting: Student

## 2024-01-13 ENCOUNTER — Ambulatory Visit (HOSPITAL_BASED_OUTPATIENT_CLINIC_OR_DEPARTMENT_OTHER)
Admission: RE | Admit: 2024-01-13 | Discharge: 2024-01-13 | Disposition: A | Discharge status: Home / Self Care | Source: Ambulatory Visit | Attending: Cardiology | Admitting: Cardiology

## 2024-01-13 ENCOUNTER — Ambulatory Visit: Payer: Self-pay | Admitting: Cardiology

## 2024-01-13 ENCOUNTER — Encounter (HOSPITAL_COMMUNITY): Payer: Self-pay

## 2024-01-13 DIAGNOSIS — I25119 Atherosclerotic heart disease of native coronary artery with unspecified angina pectoris: Secondary | ICD-10-CM | POA: Insufficient documentation

## 2024-01-13 DIAGNOSIS — I2510 Atherosclerotic heart disease of native coronary artery without angina pectoris: Secondary | ICD-10-CM

## 2024-01-13 DIAGNOSIS — R9439 Abnormal result of other cardiovascular function study: Secondary | ICD-10-CM

## 2024-01-13 LAB — NM MYOCAR MULTI W/SPECT W/WALL MOTION / EF
Base ST Depression (mm): 0 mm
Estimated workload: 1
Exercise duration (min): 0 min
Exercise duration (sec): 0 s
LV dias vol: 216 mL (ref 62–150)
LV sys vol: 136 mL
MPHR: 152 {beats}/min
Nuc Stress EF: 37 %
Peak HR: 79 {beats}/min
Percent HR: 51 %
RATE: 0.4
Rest HR: 51 {beats}/min
Rest Nuclear Isotope Dose: 11 mCi
SDS: 0
SRS: 10
SSS: 10
ST Depression (mm): 0 mm
Stress Nuclear Isotope Dose: 33 mCi
TID: 1.08

## 2024-01-13 MED ORDER — TECHNETIUM TC 99M TETROFOSMIN IV KIT
30.0000 | PACK | Freq: Once | INTRAVENOUS | Status: AC | PRN
  Administered 2024-01-13: 33 via INTRAVENOUS

## 2024-01-13 MED ORDER — REGADENOSON 0.4 MG/5ML IV SOLN
INTRAVENOUS | Status: AC
  Administered 2024-01-13: 0.4 mg via INTRAVENOUS
  Filled 2024-01-13: qty 5

## 2024-01-13 MED ORDER — SODIUM CHLORIDE FLUSH 0.9 % IV SOLN
INTRAVENOUS | Status: AC
  Administered 2024-01-13: 10 mL via INTRAVENOUS
  Filled 2024-01-13: qty 10

## 2024-01-13 MED ORDER — TECHNETIUM TC 99M TETROFOSMIN IV KIT
10.0000 | PACK | Freq: Once | INTRAVENOUS | Status: AC | PRN
  Administered 2024-01-13: 11 via INTRAVENOUS

## 2024-01-13 NOTE — Telephone Encounter (Signed)
     Glenn [name] Space presented for a Lexiscan  nuclear stress test today. I Laymon CHRISTELLA Qua, PA-C, provided direct supervision and was present during the stress portion of the study today, which was completed without significant symptoms, immediate complications, or acute ST/T changes on ECG.  Stress imaging is pending at this time.  Preliminary ECG findings may be listed in the chart, but the stress test result will not be finalized until perfusion imaging is complete.  Laymon CHRISTELLA Qua, PA-C  01/13/2024, 9:04 AM

## 2024-01-16 MED ORDER — ISOSORBIDE MONONITRATE ER 30 MG PO TB24: 30.0000 mg | ORAL_TABLET | Freq: Every evening | ORAL | 3 refills | Status: AC

## 2024-01-16 NOTE — Telephone Encounter (Signed)
-----   Message from Jayson Sierras sent at 01/13/2024  5:10 PM EDT ----- Results reviewed.  I reviewed the images myself as well.  There is a predominantly fixed inferior wall defect most consistent with scar, only minor peri-infarct ischemia.  LVEF calculated at 37%, we  need to get an echocardiogram to corroborate this however.  Suggest starting Imdur 30 mg once in the evening, we made other adjustments at his recent visit.  Schedule follow-up in the next 6 weeks. ----- Message ----- From: Stacia Diannah SQUIBB, MD Sent: 01/13/2024   4:26 PM EDT To: Jayson KANDICE Sierras, MD

## 2024-01-16 NOTE — Addendum Note (Signed)
 Addended by: Milliani Herrada A on: 01/16/2024 08:38 AM   Modules accepted: Orders

## 2024-01-16 NOTE — Telephone Encounter (Signed)
 The patient has been notified of the result and verbalized understanding.  All questions (if any) were answered. Bernett Dorothyann LABOR, RN 01/16/2024 8:37 AM    Imdur e-scribed to [facility] for echo and 6 week f/u

## 2024-02-02 ENCOUNTER — Telehealth: Payer: Self-pay | Admitting: Cardiology

## 2024-02-02 ENCOUNTER — Ambulatory Visit (HOSPITAL_COMMUNITY)
Admission: RE | Admit: 2024-02-02 | Discharge: 2024-02-02 | Disposition: A | Discharge status: Home / Self Care | Source: Ambulatory Visit | Attending: Cardiology | Admitting: Cardiology

## 2024-02-02 ENCOUNTER — Ambulatory Visit: Payer: Self-pay | Admitting: Cardiology

## 2024-02-02 DIAGNOSIS — I2510 Atherosclerotic heart disease of native coronary artery without angina pectoris: Secondary | ICD-10-CM | POA: Diagnosis present

## 2024-02-02 LAB — ECHOCARDIOGRAM COMPLETE
Area-P 1/2: 4.21 cm2
MV M vel: 5.07 m/s
MV Peak grad: 102.8 mmHg
Radius: 0.4 cm
S' Lateral: 4.3 cm

## 2024-02-02 NOTE — Telephone Encounter (Signed)
 Pt came into the office wanting to know if he can get a note for his upcoming appointment on 02/24/2024. He has jury duty but does not want to miss his appointment with Dr. Debera. He feels that is more important. He has an echo today at 1130 and I told him to stop back by for the note if we are able to get one for him.

## 2024-02-02 NOTE — Progress Notes (Signed)
*  PRELIMINARY RESULTS* Echocardiogram 2D Echocardiogram has been performed.  Glenn [name] 02/02/2024, 12:05 PM

## 2024-02-24 ENCOUNTER — Encounter: Payer: Self-pay | Admitting: Cardiology

## 2024-02-24 ENCOUNTER — Ambulatory Visit: Attending: Cardiology | Admitting: Cardiology

## 2024-02-24 VITALS — BP 110/56 | HR 60 | Ht 68.0 in | Wt 195.4 lb

## 2024-02-24 DIAGNOSIS — I25119 Atherosclerotic heart disease of native coronary artery with unspecified angina pectoris: Secondary | ICD-10-CM | POA: Diagnosis present

## 2024-02-24 DIAGNOSIS — E782 Mixed hyperlipidemia: Secondary | ICD-10-CM | POA: Insufficient documentation

## 2024-02-24 DIAGNOSIS — I10 Essential (primary) hypertension: Secondary | ICD-10-CM | POA: Insufficient documentation

## 2024-02-24 NOTE — Patient Instructions (Signed)
 Medication Instructions:  Your physician recommends that you continue on your current medications as directed. Please refer to the Current Medication list given to you today.  *If you need a refill on your cardiac medications before your next appointment, please call your pharmacy*  Lab Work: NONE   If you have labs (blood work) drawn today and your tests are completely normal, you will receive your results only by: MyChart Message (if you have MyChart) OR A paper copy in the mail If you have any lab test that is abnormal or we need to change your treatment, we will call you to review the results.  Testing/Procedures: NONE   Follow-Up: At [facility], you and your health needs are our priority.  As part of our continuing mission to provide you with exceptional heart care, our providers are all part of one team.  This team includes your primary Cardiologist (physician) and Advanced Practice Providers or APPs (Physician Assistants and Nurse Practitioners) who all work together to provide you with the care you need, when you need it.  Your next appointment:   6 month(s)  Provider:   Jayson Sierras, MD    We recommend signing up for the patient portal called MyChart.  Sign up information is provided on this After Visit Summary.  MyChart is used to connect with patients for Virtual Visits (Telemedicine).  Patients are able to view lab/test results, encounter notes, upcoming appointments, etc.  Non-urgent messages can be sent to your provider as well.   To learn more about what you can do with MyChart, go to [email or web address].   Other Instructions Thank you for choosing [facility]!

## 2024-02-24 NOTE — Progress Notes (Signed)
    Cardiology Office Note  Date: 02/24/2024   ID: NIKKOLAS COOMES, DOB 03/11/1956, MRN [id number]  History of Present Illness: Glenn [name] is a 68 y.o. male last seen in October.  He is here for a follow-up visit.  Since last encounter he reports no progressive angina, has felt somewhat better since we started Imdur.  We discussed the results of his Myoview  and echocardiogram.  I reviewed his medications, plan to continue with present regimen and observation for now.  Physical Exam: VS:  BP (!) 110/56   Pulse 60   Ht 5' 8 (1.727 m)   Wt 195 lb 6.4 oz (88.6 kg)   SpO2 95%   BMI 29.71 kg/m , BMI Body mass index is 29.71 kg/m.  Wt Readings from Last 3 Encounters:  02/24/24 195 lb 6.4 oz (88.6 kg)  01/11/24 186 lb (84.4 kg)  05/25/23 191 lb 6.4 oz (86.8 kg)    General: Patient appears comfortable at rest. HEENT: Conjunctiva and lids normal. Neck: Supple, no elevated JVP or carotid bruits. Lungs: Clear to auscultation, nonlabored breathing at rest. Cardiac: Regular rate and rhythm, no S3, 1/6 systolic murmur, no pericardial rub. Extremities: No pitting edema.  ECG:  An ECG dated 01/11/2024 was personally reviewed today and demonstrated:  Sinus bradycardia with prolonged PR interval, right bundle branch block, PAC.  Labwork:  July 2025: Hemoglobin 13.7, platelets 150, BUN 23, creatinine 1.05, potassium 4.4, AST 33, ALT 27, cholesterol 109, triglycerides 61, HDL 57, LDL 38, hemoglobin A1c 5.9%   Other Studies Reviewed Today:  Echocardiogram 01/16/2024:  1. Left ventricular ejection fraction, by estimation, is 50 to 55%. Left  ventricular ejection fraction by 3D volume is 52 %. The left ventricle has  low normal function. The left ventricle demonstrates regional wall motion  abnormalities (see scoring  diagram/findings for description). The left ventricular internal cavity  size was moderately dilated. Left ventricular diastolic parameters are  indeterminate.   2. Right  ventricular systolic function is mildly reduced. The right  ventricular size is normal. Tricuspid regurgitation signal is inadequate  for assessing PA pressure.   3. Right atrial size was mildly dilated.   4. The mitral valve is normal in structure. Mild mitral valve  regurgitation. No evidence of mitral stenosis.   5. The aortic valve is tricuspid. Aortic valve regurgitation is not  visualized. No aortic stenosis is present.   6. The inferior vena cava is dilated in size with >50% respiratory  variability, suggesting right atrial pressure of 8 mmHg.   Assessment and Plan:  1.  Multivessel CAD status post CABG in 2019 with LIMA to LAD, SVG to first diagonal, SVG to ramus intermedius and circumflex, and SVG to PDA.  I reviewed his follow-up Lexiscan  Myoview  from October showing predominantly fixed inferior scar with minor peri-infarct ischemia.  LVEF was calculated at 37% by that study, however echocardiogram revealed LVEF 50 to 55% which is relatively stable.  Clinically improved after addition of Imdur 30 mg daily.  Continue aspirin  81 mg daily, Lipitor  80 mg daily, Zetia  10 mg daily, and as needed nitroglycerin .   2.  Mixed hyperlipidemia.  LDL 38 and HDL 57 in July.  Continue Lipitor  80 mg daily and Zetia  10 mg daily.   3.  Primary hypertension.  Continue bisoprolol  2.5 mg daily and Cozaar  25 mg daily.  Disposition:  Follow up 6 months.  Signed, Jayson JUDITHANN Sierras, M.D., F.A.C.C. [facility] at [facility]

## 2024-05-08 ENCOUNTER — Other Ambulatory Visit: Payer: Self-pay | Admitting: Cardiology
